# Patient Record
Sex: Male | Born: 1960 | Race: Black or African American | Hispanic: No | Marital: Married | Smoking: Never smoker
Health system: Southern US, Community
[De-identification: ages and names within clinical notes are randomized; demographics above are authoritative.]

## PROBLEM LIST (undated history)

## (undated) DIAGNOSIS — I10 Essential (primary) hypertension: Secondary | ICD-10-CM

## (undated) DIAGNOSIS — Z87442 Personal history of urinary calculi: Secondary | ICD-10-CM

## (undated) DIAGNOSIS — C801 Malignant (primary) neoplasm, unspecified: Secondary | ICD-10-CM

## (undated) DIAGNOSIS — K219 Gastro-esophageal reflux disease without esophagitis: Secondary | ICD-10-CM

## (undated) DIAGNOSIS — R739 Hyperglycemia, unspecified: Secondary | ICD-10-CM

## (undated) DIAGNOSIS — R972 Elevated prostate specific antigen [PSA]: Secondary | ICD-10-CM

## (undated) DIAGNOSIS — E669 Obesity, unspecified: Secondary | ICD-10-CM

## (undated) DIAGNOSIS — M25559 Pain in unspecified hip: Secondary | ICD-10-CM

## (undated) HISTORY — PX: COLONOSCOPY WITH PROPOFOL: SHX5780

## (undated) HISTORY — DX: Gastro-esophageal reflux disease without esophagitis: K21.9

## (undated) HISTORY — DX: Essential (primary) hypertension: I10

## (undated) MED FILL — Dexamethasone Sodium Phosphate Inj 100 MG/10ML: INTRAMUSCULAR | Qty: 1 | Status: AC

---

## 2012-02-21 ENCOUNTER — Ambulatory Visit: Payer: Self-pay | Admitting: Unknown Physician Specialty

## 2016-12-05 DIAGNOSIS — R972 Elevated prostate specific antigen [PSA]: Secondary | ICD-10-CM | POA: Insufficient documentation

## 2017-12-22 ENCOUNTER — Ambulatory Visit
Admission: EM | Admit: 2017-12-22 | Discharge: 2017-12-22 | Disposition: A | Discharge status: Home / Self Care | Payer: BLUE CROSS/BLUE SHIELD | Attending: Family Medicine | Admitting: Family Medicine

## 2017-12-22 ENCOUNTER — Encounter: Payer: Self-pay | Admitting: Gynecology

## 2017-12-22 ENCOUNTER — Other Ambulatory Visit: Payer: Self-pay

## 2017-12-22 DIAGNOSIS — R109 Unspecified abdominal pain: Secondary | ICD-10-CM

## 2017-12-22 DIAGNOSIS — E876 Hypokalemia: Secondary | ICD-10-CM | POA: Diagnosis not present

## 2017-12-22 DIAGNOSIS — R112 Nausea with vomiting, unspecified: Secondary | ICD-10-CM

## 2017-12-22 DIAGNOSIS — R3129 Other microscopic hematuria: Secondary | ICD-10-CM | POA: Diagnosis not present

## 2017-12-22 DIAGNOSIS — N289 Disorder of kidney and ureter, unspecified: Secondary | ICD-10-CM | POA: Diagnosis not present

## 2017-12-22 HISTORY — DX: Elevated prostate specific antigen (PSA): R97.20

## 2017-12-22 HISTORY — DX: Hyperglycemia, unspecified: R73.9

## 2017-12-22 HISTORY — DX: Pain in unspecified hip: M25.559

## 2017-12-22 HISTORY — DX: Obesity, unspecified: E66.9

## 2017-12-22 LAB — BASIC METABOLIC PANEL
ANION GAP: 13 (ref 5–15)
BUN: 22 mg/dL — ABNORMAL HIGH (ref 6–20)
CALCIUM: 9.5 mg/dL (ref 8.9–10.3)
CO2: 27 mmol/L (ref 22–32)
CREATININE: 1.66 mg/dL — AB (ref 0.61–1.24)
Chloride: 102 mmol/L (ref 98–111)
GFR calc Af Amer: 51 mL/min — ABNORMAL LOW (ref >60)
GFR calc non Af Amer: 44 mL/min — ABNORMAL LOW (ref >60)
Glucose, Bld: 135 mg/dL — ABNORMAL HIGH (ref 70–99)
Potassium: 3.1 mmol/L — ABNORMAL LOW (ref 3.5–5.1)
Sodium: 142 mmol/L (ref 135–145)

## 2017-12-22 LAB — URINALYSIS, COMPLETE (UACMP) WITH MICROSCOPIC
Bacteria, UA: NONE SEEN
Bilirubin Urine: NEGATIVE
GLUCOSE, UA: NEGATIVE mg/dL
Ketones, ur: NEGATIVE mg/dL
Leukocytes, UA: NEGATIVE
NITRITE: NEGATIVE
Protein, ur: NEGATIVE mg/dL
RBC / HPF: >50 RBC/hpf (ref 0–5)
SPECIFIC GRAVITY, URINE: 1.02 (ref 1.005–1.030)
Squamous Epithelial / LPF: NONE SEEN (ref 0–5)
pH: 6 (ref 5.0–8.0)

## 2017-12-22 MED ORDER — ONDANSETRON 8 MG PO TBDP
8.0000 mg | ORAL_TABLET | Freq: Once | ORAL | Status: AC
  Administered 2017-12-22: 8 mg via ORAL

## 2017-12-22 MED ORDER — ONDANSETRON 8 MG PO TBDP: 8.0000 mg | ORAL_TABLET | Freq: Three times a day (TID) | ORAL | 0 refills | Status: DC | PRN

## 2017-12-22 MED ORDER — POTASSIUM CHLORIDE CRYS ER 10 MEQ PO TBCR
10.0000 meq | EXTENDED_RELEASE_TABLET | Freq: Once | ORAL | Status: AC
  Administered 2017-12-22: 10 meq via ORAL

## 2017-12-22 MED ORDER — TAMSULOSIN HCL 0.4 MG PO CAPS: 0.4000 mg | ORAL_CAPSULE | Freq: Every day | ORAL | 0 refills | Status: DC

## 2017-12-22 NOTE — Discharge Instructions (Signed)
Increase water intake Hold diuretic tomorrow Follow up with Primary Care tomorrow If symptoms worsen tonight recommend going to Emergency Department

## 2017-12-22 NOTE — ED Triage Notes (Signed)
Patient c/o lower back pain / nausea and vomiting. Per patient after eating breakfast this morning starting to feel ill.

## 2017-12-22 NOTE — ED Provider Notes (Signed)
[facility]    CSN: [id number] Arrival date & time: 12/22/17  1357     History   Chief Complaint Chief Complaint  Patient presents with  . Back Pain    HPI Philip Thibault. is a 57 y.o. male.   57 yo male with a c/o right flank pain, nausea and vomiting since this morning. States he ate a 2 different cookouts yesterday. Denies any fevers, chills, diarrhea, dysuria, hematuria, chest pains, shortness of breath. States has vomited 3 times.  The history is provided by the patient.  Back Pain    Past Medical History:  Diagnosis Date  . Elevated PSA   . Hip pain   . Hyperglycemia   . Obesity     There are no active problems to display for this patient.   History reviewed. No pertinent surgical history.     Home Medications    Prior to Admission medications   Medication Sig Start Date End Date Taking? Authorizing Provider  amLODipine (NORVASC) 10 MG tablet take 1 tablet by mouth once daily 09/03/17  Yes [provider]  aspirin EC 81 MG tablet Take by mouth. 06/05/17  Yes [provider]  Cholecalciferol (VITAMIN D3) 2000 units capsule Take by mouth.   Yes [provider]  metoprolol succinate (TOPROL-XL) 100 MG 24 hr tablet take 1 tablet by mouth once daily 09/03/17  Yes [provider]  simvastatin (ZOCOR) 20 MG tablet take 1 tablet by mouth at bedtime 09/03/17  Yes [provider]  triamterene-hydrochlorothiazide (DYAZIDE) 37.5-25 MG capsule take 1 capsule by mouth once daily 09/03/17  Yes [provider]  ondansetron (ZOFRAN ODT) 8 MG disintegrating tablet Take 1 tablet (8 mg total) by mouth every 8 (eight) hours as needed. 12/22/17   Norval Gable, MD  tamsulosin (FLOMAX) 0.4 MG CAPS capsule Take 1 capsule (0.4 mg total) by mouth daily. 12/22/17   Norval Gable, MD    Family History Family History  Problem Relation Age of Onset  . Bone cancer Mother   . Hypertension Father   . Stroke Father      Social History Social History   Tobacco Use  . Smoking status: Never Smoker  . Smokeless tobacco: Never Used  Substance Use Topics  . Alcohol use: Yes    Comment: occasion  . Drug use: Never     Allergies   Patient has no known allergies.   Review of Systems Review of Systems  Musculoskeletal: Positive for back pain.     Physical Exam Triage Vital Signs ED Triage Vitals  Enc Vitals Group     BP 12/22/17 1411 140/82     Pulse Rate 12/22/17 1411 60     Resp 12/22/17 1411 16     Temp 12/22/17 1411 98.7 F (37.1 C)     Temp Source 12/22/17 1411 Oral     SpO2 12/22/17 1411 97 %     Weight 12/22/17 1410 260 lb (117.9 kg)     Height 12/22/17 1410 6\' 1"  (1.854 m)     Head Circumference --      Peak Flow --      Pain Score 12/22/17 1409 5     Pain Loc --      Pain Edu? --      Excl. in [place]? --    No data found.  Updated Vital Signs BP 140/82 (BP Location: Left Arm)   Pulse 60   Temp 98.7 F (37.1 C) (Oral)  Resp 16   Ht 6\' 1"  (1.854 m)   Wt 260 lb (117.9 kg)   SpO2 97%   BMI 34.30 kg/m   Visual Acuity Right Eye Distance:   Left Eye Distance:   Bilateral Distance:    Right Eye Near:   Left Eye Near:    Bilateral Near:     Physical Exam  Constitutional: He is oriented to person, place, and time. He appears well-developed and well-nourished. No distress.  HENT:  Head: Normocephalic and atraumatic.  Cardiovascular: Normal rate, regular rhythm, normal heart sounds and intact distal pulses.  No murmur heard. Pulmonary/Chest: Effort normal and breath sounds normal. No respiratory distress. He has no wheezes. He has no rales.  Abdominal: Soft. Bowel sounds are normal. He exhibits no distension and no mass. There is tenderness (mild, right flank tendnerness). There is no rebound and no guarding.  Neurological: He is alert and oriented to person, place, and time.  Skin: No rash noted. He is not diaphoretic.  Nursing note and vitals reviewed.    UC  Treatments / Results  Labs (all labs ordered are listed, but only abnormal results are displayed) Labs Reviewed  BASIC METABOLIC PANEL - Abnormal; Notable for the following components:      Result Value   Potassium 3.1 (*)    Glucose, Bld 135 (*)    BUN 22 (*)    Creatinine, Ser 1.66 (*)    GFR calc non Af Amer 44 (*)    GFR calc Af Amer 51 (*)    All other components within normal limits  URINALYSIS, COMPLETE (UACMP) WITH MICROSCOPIC - Abnormal; Notable for the following components:   Hgb urine dipstick MODERATE (*)    All other components within normal limits    EKG None  Radiology No results found.  Procedures Procedures (including critical care time)  Medications Ordered in UC Medications  ondansetron (ZOFRAN-ODT) disintegrating tablet 8 mg (8 mg Oral Given 12/22/17 1450)  potassium chloride (K-DUR,KLOR-CON) CR tablet 10 mEq (10 mEq Oral Given 12/22/17 1552)    Initial Impression / Assessment and Plan / UC Course  I have reviewed the triage vital signs and the nursing notes.  Pertinent labs & imaging results that were available during my care of the patient were reviewed by me and considered in my medical decision making (see chart for details).      Final Clinical Impressions(s) / UC Diagnoses   Final diagnoses:  Nausea and vomiting, intractability of vomiting not specified, unspecified vomiting type  Right flank pain  Other microscopic hematuria  Hypokalemia  Acute renal insufficiency     Discharge Instructions     Increase water intake Hold diuretic tomorrow Follow up with Primary Care tomorrow If symptoms worsen tonight recommend going to Emergency Department    ED Prescriptions    Medication Sig Dispense Auth. Provider   ondansetron (ZOFRAN ODT) 8 MG disintegrating tablet Take 1 tablet (8 mg total) by mouth every 8 (eight) hours as needed. 6 tablet Norval Gable, MD   tamsulosin (FLOMAX) 0.4 MG CAPS capsule Take 1 capsule (0.4 mg total) by mouth  daily. 10 capsule Norval Gable, MD      1. Lab results and diagnosis reviewed with patient 2.  Patient given zofran 8mg  odt x1 and kcl 10 MEQ po x 1 with improvement of symptoms; tolerating po fluids prior to D/C 3.  rx as per orders above; reviewed possible side effects, interactions, risks and benefits  3. Recommend supportive treatment as above  4. Follow up with PCP tomorrow for recheck labs or go to ED tonight if symptoms worsen   4. Follow-up prn   Controlled Substance Prescriptions [place] Controlled Substance Registry consulted? Not Applicable   Norval Gable, MD 12/22/17 [phone]

## 2018-04-28 NOTE — Progress Notes (Deleted)
April 29, 2018  1:12 PM   Philip Arnold [birth year] [id number]  Referring provider: Leonel Ramsay, MD No address on file  Chief Complaint  Patient presents with   Elevated PSA    HPI: Philip Arnold. is a 57 yo M was referred today for management and evaluation of elevated PSA. The patient was referred by [name], MD on 04/10/2018.  The pt notes that he was first made aware of his elevated PSA 2 years ago on routine PSA screening for insurance.  He has been followed by his PCP since.  Denies bone pain and unexpected weight loss. Denies FHx of prostate cancer. Pt reports a family history of bone marrow cancer, throat, and lung cancer on mother's side, and denies breast cancer.   Pt was put on Flowmax by Dr. [name] due to describing urination as a weak stream. Pt reports "urgency every so often" and it may be due to paying more attention to urinating.   PSA tend: 04/03/2018 PSA was 6.24 06/05/2017 PSA was 4.26 12/05/2016 PSA was 5.04   PMH: Past Medical History:  Diagnosis Date   Elevated PSA    GERD (gastroesophageal reflux disease)    Hip pain    Hyperglycemia    Hypertension    Obesity     Surgical History: No past surgical history on file.  Home Medications:  Allergies as of 04/29/2018   No Known Allergies     Medication List        Accurate as of 04/29/18  1:12 PM. Always use your most recent med list.          amLODipine 10 MG tablet Commonly known as:  NORVASC take 1 tablet by mouth once daily   aspirin EC 81 MG tablet Take by mouth.   metoprolol succinate 100 MG 24 hr tablet Commonly known as:  TOPROL-XL take 1 tablet by mouth once daily   potassium chloride 10 MEQ tablet Commonly known as:  K-DUR Take 10 mEq by mouth daily.   PRILOSEC OTC 20 MG tablet Generic drug:  omeprazole Take by mouth.   simvastatin 20 MG tablet Commonly known as:  ZOCOR take 1 tablet by mouth at bedtime     tamsulosin 0.4 MG Caps capsule Commonly known as:  FLOMAX Take 1 capsule (0.4 mg total) by mouth daily.   triamterene-hydrochlorothiazide 37.5-25 MG capsule Commonly known as:  DYAZIDE take 1 capsule by mouth once daily   Vitamin D3 50 MCG (2000 UT) capsule Take by mouth.       Allergies: No Known Allergies  Family History: Family History  Problem Relation Age of Onset   Bone cancer Mother    Hypertension Father    Stroke Father     Social History:  reports that he has never smoked. He has never used smokeless tobacco. He reports that he drinks alcohol. He reports that he does not use drugs.  ROS: UROLOGY Frequent Urination?: No Hard to postpone urination?: No Burning/pain with urination?: No Get up at night to urinate?: No Leakage of urine?: Yes Urine stream starts and stops?: No Trouble starting stream?: No Do you have to strain to urinate?: No Blood in urine?: No Urinary tract infection?: No Sexually transmitted disease?: No Injury to kidneys or bladder?: No Painful intercourse?: No Weak stream?: No Erection problems?: No Penile pain?: No  Gastrointestinal Nausea?: No Vomiting?: No Indigestion/heartburn?: No Diarrhea?: No Constipation?: No  Constitutional Fever: No Night sweats?: No Weight loss?: No  Fatigue?: No  Skin Skin rash/lesions?: No Itching?: No  Eyes Blurred vision?: No Double vision?: No  Ears/Nose/Throat Sore throat?: No Sinus problems?: No  Hematologic/Lymphatic Swollen glands?: No Easy bruising?: No  Cardiovascular Leg swelling?: No Chest pain?: No  Respiratory Cough?: No Shortness of breath?: No  Endocrine Excessive thirst?: No  Musculoskeletal Back pain?: No Joint pain?: No  Neurological Headaches?: No Dizziness?: No  Psychologic Depression?: No Anxiety?: No  Physical Exam: BP (!) 144/79 (BP Location: Left Arm, Patient Position: Sitting, Cuff Size: Large)    Pulse (!) 59    Ht 6' (1.829 m)    Wt  247 lb (112 kg)    BMI 33.50 kg/m   Constitutional:  Alert and oriented, No acute distress. HEENT: [place] AT, moist mucus membranes.  Trachea midline, no masses. Cardiovascular: No clubbing, cyanosis, or edema. Respiratory: Normal respiratory effort, no increased work of breathing. GU: No CVA tenderness, rectal exam with no nodules,normal  sphincter tone, 30 g prostate. Skin: No rashes, bruises or suspicious lesions. Neurologic: Grossly intact, no focal deficits, moving all 4 extremities. Psychiatric: Normal mood and affect.  Laboratory Data: Lab Results  Component Value Date   CREATININE 1.66 (H) 12/22/2017   Urinalysis    Component Value Date/Time   COLORURINE YELLOW 12/22/2017 [place] 12/22/2017 1449   LABSPEC 1.020 12/22/2017 1449   PHURINE 6.0 12/22/2017 1449   GLUCOSEU NEGATIVE 12/22/2017 1449   HGBUR MODERATE (A) 12/22/2017 1449   BILIRUBINUR NEGATIVE 12/22/2017 1449   KETONESUR NEGATIVE 12/22/2017 1449   PROTEINUR NEGATIVE 12/22/2017 1449   NITRITE NEGATIVE 12/22/2017 1449   LEUKOCYTESUR NEGATIVE 12/22/2017 1449    Lab Results  Component Value Date   BACTERIA NONE SEEN 12/22/2017    Assessment & Plan:    1. Elevated PSA  -Advised to stop Flomax to evaluate for change in symptoms (relatively asymptomatic) -Discussed procedure and complications of prostate biopsy -We reviewed the implications of an elevated PSA and the uncertainty surrounding it. In general, a man's PSA increases with age and is produced by both normal and cancerous prostate tissue. Differential for elevated PSA is BPH, prostate cancer, infection, recent intercourse/ejaculation, prostate infarction, recent urethroscopic manipulation (foley placement/cystoscopy) and prostatitis. Management of an elevated PSA can include observation or prostate biopsy and wediscussed this in detail. -We discussed that indications for prostate biopsy are defined by age and race specific PSA cutoffs as well  as a PSA velocity of 0.75/year. -Follow-up in 3 months to trend PSA   Return in about 3 months (around 07/30/2018) for Clinic Visit with Labs 3 Days prior .  Hollice Espy, MD  [facility] [address], [place],  [address] [phone]  I, [name], am acting as a scribe for Dr. Hollice Espy,  I have reviewed the above documentation for accuracy and completeness, and I agree with the above.   Hollice Espy, MD

## 2018-04-29 ENCOUNTER — Encounter: Payer: Self-pay | Admitting: Urology

## 2018-04-29 ENCOUNTER — Ambulatory Visit (INDEPENDENT_AMBULATORY_CARE_PROVIDER_SITE_OTHER): Payer: BLUE CROSS/BLUE SHIELD | Admitting: Urology

## 2018-04-29 VITALS — BP 144/79 | HR 59 | Ht 72.0 in | Wt 247.0 lb

## 2018-04-29 DIAGNOSIS — I10 Essential (primary) hypertension: Secondary | ICD-10-CM | POA: Insufficient documentation

## 2018-04-29 DIAGNOSIS — R972 Elevated prostate specific antigen [PSA]: Secondary | ICD-10-CM

## 2018-04-29 DIAGNOSIS — M25559 Pain in unspecified hip: Secondary | ICD-10-CM | POA: Insufficient documentation

## 2018-04-29 DIAGNOSIS — E7800 Pure hypercholesterolemia, unspecified: Secondary | ICD-10-CM | POA: Insufficient documentation

## 2018-04-29 DIAGNOSIS — E669 Obesity, unspecified: Secondary | ICD-10-CM | POA: Insufficient documentation

## 2018-04-29 DIAGNOSIS — R739 Hyperglycemia, unspecified: Secondary | ICD-10-CM | POA: Insufficient documentation

## 2018-04-29 NOTE — Progress Notes (Signed)
April 29, 2018  1:12 PM   Philip Arnold [birth year] [id number]  Referring provider: Leonel Ramsay, MD No address on file  Chief Complaint  Patient presents with  . Elevated PSA    HPI: Philip Arnold. is a 57 yo M was referred today for management and evaluation of elevated PSA. The patient was referred by [name], MD on 04/10/2018.  The pt notes that he was first made aware of his elevated PSA 2 years ago on routine PSA screening for insurance.  He has been followed by his PCP since.  Denies bone pain and unexpected weight loss. Denies FHx of prostate cancer. Pt reports a family history of bone marrow cancer, throat, and lung cancer on mother's side, and denies breast cancer.   Pt was put on Flowmax by Dr. [name] due to describing urination as a weak stream. Pt reports "urgency every so often" and it may be due to paying more attention to urinating.   PSA tend: 04/03/2018 PSA was 6.24 06/05/2017 PSA was 4.26 12/05/2016 PSA was 5.04   PMH: Past Medical History:  Diagnosis Date  . Elevated PSA   . GERD (gastroesophageal reflux disease)   . Hip pain   . Hyperglycemia   . Hypertension   . Obesity     Surgical History: No past surgical history on file.  Home Medications:  Allergies as of 04/29/2018   No Known Allergies     Medication List        Accurate as of 04/29/18  1:12 PM. Always use your most recent med list.          amLODipine 10 MG tablet Commonly known as:  NORVASC take 1 tablet by mouth once daily   aspirin EC 81 MG tablet Take by mouth.   metoprolol succinate 100 MG 24 hr tablet Commonly known as:  TOPROL-XL take 1 tablet by mouth once daily   potassium chloride 10 MEQ tablet Commonly known as:  K-DUR Take 10 mEq by mouth daily.   PRILOSEC OTC 20 MG tablet Generic drug:  omeprazole Take by mouth.   simvastatin 20 MG tablet Commonly known as:  ZOCOR take 1 tablet by mouth at bedtime     tamsulosin 0.4 MG Caps capsule Commonly known as:  FLOMAX Take 1 capsule (0.4 mg total) by mouth daily.   triamterene-hydrochlorothiazide 37.5-25 MG capsule Commonly known as:  DYAZIDE take 1 capsule by mouth once daily   Vitamin D3 50 MCG (2000 UT) capsule Take by mouth.       Allergies: No Known Allergies  Family History: Family History  Problem Relation Age of Onset  . Bone cancer Mother   . Hypertension Father   . Stroke Father     Social History:  reports that he has never smoked. He has never used smokeless tobacco. He reports that he drinks alcohol. He reports that he does not use drugs.  ROS: UROLOGY Frequent Urination?: No Hard to postpone urination?: No Burning/pain with urination?: No Get up at night to urinate?: No Leakage of urine?: Yes Urine stream starts and stops?: No Trouble starting stream?: No Do you have to strain to urinate?: No Blood in urine?: No Urinary tract infection?: No Sexually transmitted disease?: No Injury to kidneys or bladder?: No Painful intercourse?: No Weak stream?: No Erection problems?: No Penile pain?: No  Gastrointestinal Nausea?: No Vomiting?: No Indigestion/heartburn?: No Diarrhea?: No Constipation?: No  Constitutional Fever: No Night sweats?: No Weight loss?: No  Fatigue?: No  Skin Skin rash/lesions?: No Itching?: No  Eyes Blurred vision?: No Double vision?: No  Ears/Nose/Throat Sore throat?: No Sinus problems?: No  Hematologic/Lymphatic Swollen glands?: No Easy bruising?: No  Cardiovascular Leg swelling?: No Chest pain?: No  Respiratory Cough?: No Shortness of breath?: No  Endocrine Excessive thirst?: No  Musculoskeletal Back pain?: No Joint pain?: No  Neurological Headaches?: No Dizziness?: No  Psychologic Depression?: No Anxiety?: No  Physical Exam: BP (!) 144/79 (BP Location: Left Arm, Patient Position: Sitting, Cuff Size: Large)   Pulse (!) 59   Ht 6' (1.829 m)   Wt  247 lb (112 kg)   BMI 33.50 kg/m   Constitutional:  Alert and oriented, No acute distress. HEENT: [place] AT, moist mucus membranes.  Trachea midline, no masses. Cardiovascular: No clubbing, cyanosis, or edema. Respiratory: Normal respiratory effort, no increased work of breathing. GU: No CVA tenderness, rectal exam with no nodules,normal  sphincter tone, 30 g prostate. Skin: No rashes, bruises or suspicious lesions. Neurologic: Grossly intact, no focal deficits, moving all 4 extremities. Psychiatric: Normal mood and affect.  Laboratory Data: Lab Results  Component Value Date   CREATININE 1.66 (H) 12/22/2017   Urinalysis    Component Value Date/Time   COLORURINE YELLOW 12/22/2017 [place] 12/22/2017 1449   LABSPEC 1.020 12/22/2017 1449   PHURINE 6.0 12/22/2017 1449   GLUCOSEU NEGATIVE 12/22/2017 1449   HGBUR MODERATE (A) 12/22/2017 1449   BILIRUBINUR NEGATIVE 12/22/2017 1449   KETONESUR NEGATIVE 12/22/2017 1449   PROTEINUR NEGATIVE 12/22/2017 1449   NITRITE NEGATIVE 12/22/2017 1449   LEUKOCYTESUR NEGATIVE 12/22/2017 1449    Lab Results  Component Value Date   BACTERIA NONE SEEN 12/22/2017    Assessment & Plan:    1. Elevated PSA  -Advised to stop Flomax to evaluate for change in symptoms (relatively asymptomatic) -Discussed procedure and complications of prostate biopsy -We reviewed the implications of an elevated PSA and the uncertainty surrounding it. In general, a man's PSA increases with age and is produced by both normal and cancerous prostate tissue. Differential for elevated PSA is BPH, prostate cancer, infection, recent intercourse/ejaculation, prostate infarction, recent urethroscopic manipulation (foley placement/cystoscopy) and prostatitis. Management of an elevated PSA can include observation or prostate biopsy and wediscussed this in detail. -We discussed that indications for prostate biopsy are defined by age and race specific PSA cutoffs as well  as a PSA velocity of 0.75/year. -Follow-up in 3 months to trend PSA   Return in about 3 months (around 07/30/2018) for Clinic Visit with Labs 3 Days prior .  Hollice Espy, MD  [facility] [address] [place], [place] [address] [phone]  I, [name], am acting as a scribe for Dr. Hollice Espy,  I have reviewed the above documentation for accuracy and completeness, and I agree with the above.   Hollice Espy, MD

## 2018-07-21 ENCOUNTER — Other Ambulatory Visit: Payer: Self-pay

## 2018-07-21 DIAGNOSIS — R972 Elevated prostate specific antigen [PSA]: Secondary | ICD-10-CM

## 2018-07-22 ENCOUNTER — Other Ambulatory Visit: Payer: BC Managed Care – PPO

## 2018-07-22 DIAGNOSIS — R972 Elevated prostate specific antigen [PSA]: Secondary | ICD-10-CM

## 2018-07-23 ENCOUNTER — Other Ambulatory Visit: Payer: BLUE CROSS/BLUE SHIELD

## 2018-07-23 LAB — PSA: Prostate Specific Ag, Serum: 7 ng/mL — ABNORMAL HIGH (ref 0.0–4.0)

## 2018-07-26 NOTE — Progress Notes (Signed)
07/29/2018  1:58 PM   Philip Arnold. 06/28/1960 [id number]  Referring provider: [name], MD [place], [place] [address]  Chief Complaint  Patient presents with  . Elevated PSA    HPI: Philip Arnold. is a 58 y.o. male who presents today for his three-month surveillance of his elevated PSA.  PSA trend is below.    Notably his rectal exam last visit was 30 grams, no nodules.  He is on Flomax, with minimal urinary symptoms.  Denies hematuria, dysuria and suprapubic pain.   Patient denies any knowledge of a family history of prostate cancer.  His father did see a Dealer in his 81s for unknown reason but doesn't think it was for cancer.  Patient is currently on baby aspirin for prevention.  PSA Trend:  12/05/2016, 5.04  06/05/2018, 4.26  05/04/2018, 6.24  07/22/2018, 7.0  PMH: Past Medical History:  Diagnosis Date  . Elevated PSA   . GERD (gastroesophageal reflux disease)   . Hip pain   . Hyperglycemia   . Hypertension   . Obesity     Surgical History: History reviewed. No pertinent surgical history.  Home Medications:  Allergies as of 07/29/2018   No Known Allergies     Medication List       Accurate as of July 29, 2018  1:58 PM. Always use your most recent med list.        amLODipine 10 MG tablet Commonly known as:  NORVASC take 1 tablet by mouth once daily   aspirin EC 81 MG tablet Take by mouth.   metoprolol succinate 100 MG 24 hr tablet Commonly known as:  TOPROL-XL take 1 tablet by mouth once daily   potassium chloride 10 MEQ tablet Commonly known as:  K-DUR Take 10 mEq by mouth every other day.   PRILOSEC OTC 20 MG tablet Generic drug:  omeprazole Take by mouth.   simvastatin 20 MG tablet Commonly known as:  ZOCOR take 1 tablet by mouth at bedtime   triamterene-hydrochlorothiazide 37.5-25 MG capsule Commonly known as:  DYAZIDE take 1 capsule by mouth once daily   Vitamin D3 50 MCG (2000  UT) capsule Take by mouth.       Allergies: No Known Allergies  Family History: Family History  Problem Relation Age of Onset  . Bone cancer Mother   . Hypertension Father   . Stroke Father     Social History:  reports that he has never smoked. He has never used smokeless tobacco. He reports current alcohol use. He reports that he does not use drugs.  ROS: UROLOGY Frequent Urination?: Yes Hard to postpone urination?: No Burning/pain with urination?: No Get up at night to urinate?: No Leakage of urine?: Yes Urine stream starts and stops?: No Trouble starting stream?: No Do you have to strain to urinate?: No Blood in urine?: No Urinary tract infection?: No Sexually transmitted disease?: No Injury to kidneys or bladder?: No Painful intercourse?: No Weak stream?: No Erection problems?: No Penile pain?: No  Gastrointestinal Nausea?: No Vomiting?: No Indigestion/heartburn?: No Diarrhea?: No Constipation?: No  Constitutional Fever: No Night sweats?: No Weight loss?: No Fatigue?: No  Skin Skin rash/lesions?: No Itching?: No  Eyes Blurred vision?: No Double vision?: No  Ears/Nose/Throat Sore throat?: No Sinus problems?: No  Hematologic/Lymphatic Swollen glands?: No Easy bruising?: No  Cardiovascular Leg swelling?: No Chest pain?: No  Respiratory Cough?: No Shortness of breath?: No  Endocrine Excessive thirst?: No  Musculoskeletal  Back pain?: No Joint pain?: No  Neurological Headaches?: No Dizziness?: No  Psychologic Depression?: No Anxiety?: No  Physical Exam: BP (!) 153/87 (BP Location: Left Arm, Patient Position: Sitting, Cuff Size: Large)   Pulse (!) 56   Ht 6' (1.829 m)   Wt 245 lb 3.2 oz (111.2 kg)   BMI 33.26 kg/m   Constitutional:  Well nourished. Alert and oriented, No acute distress. Cardiovascular: No clubbing, cyanosis, or edema. Respiratory: Normal respiratory effort, no increased work of breathing. Skin: No rashes,  bruises or suspicious lesions. Multiple freckles on arms.   Neurologic: Grossly intact, no focal deficits, moving all 4 extremities. Psychiatric: Normal mood and affect.  Laboratory Data: Lab Results  Component Value Date   CREATININE 1.66 (H) 12/22/2017    Assessment & Plan:    1. Elevated PSA - Patient's PSA is rising, now over 7, the overall trend is worrisome - Counseled patient that his PSA has continued to climb above what it ought to be for his age and he should consider a prostate biopsy at this point. - We discussed prostate biopsy in detail including the procedure itself, the risks of blood in the urine, stool, and ejaculate, serious infection, and discomfort. He is willing to proceed with this as discussed. - Patient will need to withhold Asprin for 7 days prior to surgery. - Discussed with patient the steps following the biopsy  2. BPH - Continue Flomax; symptoms stable  Return for Prostate biopsy, next available.    [facility] [address] [place], [place] [address] [phone]  I, Adele Schilder, am acting as a scribe for Hollice Espy, MD.    I have reviewed the above documentation for accuracy and completeness, and I agree with the above.   Hollice Espy, MD

## 2018-07-29 ENCOUNTER — Ambulatory Visit: Payer: BC Managed Care – PPO | Admitting: Urology

## 2018-07-29 ENCOUNTER — Encounter: Payer: Self-pay | Admitting: Urology

## 2018-07-29 VITALS — BP 153/87 | HR 56 | Ht 72.0 in | Wt 245.2 lb

## 2018-07-29 DIAGNOSIS — N400 Enlarged prostate without lower urinary tract symptoms: Secondary | ICD-10-CM

## 2018-07-29 DIAGNOSIS — R972 Elevated prostate specific antigen [PSA]: Secondary | ICD-10-CM

## 2018-07-29 NOTE — Patient Instructions (Signed)
Transrectal Ultrasound Transrectal ultrasound is a procedure that uses sound waves to create images of your prostate gland and nearby tissues. The sound waves are sent through the wall of your rectum into your prostate gland, which is located in front of your rectum. The images show the size and shape of your prostate gland and nearby structures. You may have this test if you have:  Trouble urinating.  Infertility.  An abnormal prostate screening exam. Tell a health care provider about:  Any allergies you have.  All medicines you are taking, including vitamins, herbs, eye drops, creams, and over-the-counter medicines.  Any blood disorders you have.  Any medical conditions you have.  Any surgeries you have had. What are the risks? Generally, this is a safe procedure. However, problems may occur, including:  Discomfort during the procedure. This is rare.  Blood in your urine or sperm after the procedure. This is rare. What happens before the procedure?  Your health care provider may instruct you to use an enema 1-4 hours before the procedure. Follow instructions from your health care provider about how to do the enema.  Ask your health care provider about changing or stopping your regular medicines. This is especially important if you are taking diabetes medicines or blood thinners. What happens during the procedure?  You will be asked to lie down on your left side on an examination table.  You will bend your knees toward your chest.  A lubricated probe will be gently inserted into your rectum. This may cause a feeling of fullness.  The probe will send signals to a computer that will create images.  The technician will slightly rotate the probe throughout the procedure. While rotating the probe, he or she will view and capture images of the prostate gland and the surrounding structures from different angles.  The probe will be removed. The procedure may vary among health care  providers and hospitals. What happens after the procedure?  It is up to you to get the results of your procedure. Ask your health care provider, or the department that is doing the procedure, when your results will be ready. Summary  Transrectal ultrasound is a procedure that uses sound waves to create images of your prostate gland and nearby tissues.  The images show the size and shape of your prostate gland and nearby structures.  Before the procedure, ask your health care provider about changing or stopping your regular medicines. This is especially important if you are taking diabetes medicines or blood thinners. This information is not intended to replace advice given to you by your health care provider. Make sure you discuss any questions you have with your health care provider. Document Released: 03/14/2005 Document Revised: 04/27/2016 Document Reviewed: 04/27/2016 Elsevier Interactive Patient Education  2019 [employer].

## 2018-08-19 ENCOUNTER — Ambulatory Visit (INDEPENDENT_AMBULATORY_CARE_PROVIDER_SITE_OTHER): Payer: BC Managed Care – PPO | Admitting: Urology

## 2018-08-19 ENCOUNTER — Other Ambulatory Visit: Payer: Self-pay | Admitting: Urology

## 2018-08-19 ENCOUNTER — Encounter: Payer: Self-pay | Admitting: Urology

## 2018-08-19 VITALS — BP 150/88 | HR 66 | Ht 72.0 in | Wt 245.0 lb

## 2018-08-19 DIAGNOSIS — R972 Elevated prostate specific antigen [PSA]: Secondary | ICD-10-CM | POA: Diagnosis not present

## 2018-08-19 DIAGNOSIS — N400 Enlarged prostate without lower urinary tract symptoms: Secondary | ICD-10-CM

## 2018-08-19 MED ORDER — GENTAMICIN SULFATE 40 MG/ML IJ SOLN
80.0000 mg | Freq: Once | INTRAMUSCULAR | Status: AC
  Administered 2018-08-19: 80 mg via INTRAMUSCULAR

## 2018-08-19 MED ORDER — LEVOFLOXACIN 500 MG PO TABS
500.0000 mg | ORAL_TABLET | Freq: Once | ORAL | Status: AC
  Administered 2018-08-19: 500 mg via ORAL

## 2018-08-19 NOTE — Progress Notes (Signed)
° °  08/19/2018   CC:  Chief Complaint  Patient presents with   Prostate Biopsy   HPI: Philip Arnold. is a 58 y.o. male who presents today for his prostate biopsy secondary to rising PSA. PSA trend is below.   Notably his rectal exam last visit was 30 grams, no nodules.  He is on Flomax, with minimal urinary symptoms.  Denies hematuria, dysuria and suprapubic pain.   Patient denies any knowledge of a family history of prostate cancer.  His father did see a Dealer in his 4s for unknown reason but doesn't think it was for cancer.  PSA Trend:             12/05/2016, 5.04             06/05/2018, 4.26             05/04/2018, 6.24              07/22/2018, 7.0  Today's Vitals   08/19/18 1057  BP: (!) 150/88  Pulse: 66  Weight: 245 lb (111.1 kg)  Height: 6' (1.829 m)   Body mass index is 33.23 kg/m. NED. A&Ox3.   No respiratory distress   Abd soft, NT, ND Normal sphincter tone  Prostate Biopsy Procedure   Informed consent was obtained after discussing risks/benefits of the procedure.  A time out was performed to ensure correct patient identity.  Pre-Procedure: - Last PSA Level: 7.0 - Gentamicin given prophylactically - Levaquin 500 mg administered PO -Transrectal Ultrasound performed revealing a 4.17 cm x 3.46 cm x 4.8 cm, Volume is 36. 2 mL prostate -No significant hypoechoic or median lobe noted  Procedure: - Prostate block performed using 10 cc 1% lidocaine and biopsies taken from sextant areas, a total of 12 under ultrasound guidance.  Post-Procedure: - Patient tolerated the procedure well - He was counseled to seek immediate medical attention if experiences any severe pain, significant bleeding, or fevers - Return in one week to discuss biopsy results  Assessment/ Plan:  1. Elevated PSA Patient's PSA is rising, now over 7, the overall trend is worrisome Pt tolerated prostate biopsy, discussed possible symptoms of blood in urine, normal stool and  ejaculated fluid. Fevers is the major complication and needs to go to ED indicating possible sepsis or something life threatening.  F/u as schedule for results  2. BPH Continue Flomax; symptoms stable  Hollice Espy, MD   I, [name], am acting as a scribe for Dr. Hollice Espy,  I have reviewed the above documentation for accuracy and completeness, and I agree with the above.   Hollice Espy, MD

## 2018-08-22 LAB — PROSTATE BIOPSIES

## 2018-08-25 ENCOUNTER — Other Ambulatory Visit: Payer: Self-pay | Admitting: Urology

## 2018-09-03 ENCOUNTER — Ambulatory Visit: Payer: BC Managed Care – PPO | Admitting: Urology

## 2018-09-03 ENCOUNTER — Other Ambulatory Visit: Payer: Self-pay

## 2018-09-03 ENCOUNTER — Encounter: Payer: Self-pay | Admitting: Urology

## 2018-09-03 VITALS — BP 158/93 | HR 54 | Ht 72.0 in | Wt 248.0 lb

## 2018-09-03 DIAGNOSIS — C61 Malignant neoplasm of prostate: Secondary | ICD-10-CM | POA: Diagnosis not present

## 2018-09-03 NOTE — Progress Notes (Signed)
09/03/2018 9:47 AM   Philip Arnold. 09-14-1960 [id number]  Referring provider: [name], [name], [name] [address]  Chief Complaint  Patient presents with   Prostate Cancer    Prostate Biopsy Results    HPI: 58 yo with newly dz prostate cancer who returns to the office today to discuss his biopsy results.  Prostate biopsy on 08/19/2018 which was uncomplicated.  Biopsy results revealed 1 single core of Gleason 3+4 involving 9% of the tissue at the right lateral apex.  There is also an atypical focus of suspicious appearing cells at the left apex but not diagnostic of prostate cancer.  Remainder of the prostate was benign.   TRUS volume 36.2 mL  PSA 7.0  No previous surgeries.   Minimal urinary symptoms.  No significant erectile dysfunction.  IPSS and Shim as below.  IPSS    Row Name 09/03/18 1100         International Prostate Symptom Score   How often have you had the sensation of not emptying your bladder?  Less than 1 in 5     How often have you had to urinate less than every two hours?  Less than 1 in 5 times     How often have you found you stopped and started again several times when you urinated?  Not at All     How often have you found it difficult to postpone urination?  Less than 1 in 5 times     How often have you had a weak urinary stream?  Less than 1 in 5 times     How often have you had to strain to start urination?  Not at All     How many times did you typically get up at night to urinate?  None     Total IPSS Score  4       Quality of Life due to urinary symptoms   If you were to spend the rest of your life with your urinary condition just the way it is now how would you feel about that?  Mixed        Score:  1-7 Mild 8-19 Moderate 20-35 Severe  SHIM    Row Name 09/03/18 1135         SHIM: Over the last 6 months:   How do you rate your confidence that you could get and keep an erection?  High     When  you had erections with sexual stimulation, how often were your erections hard enough for penetration (entering your partner)?  Almost Always or Always     During sexual intercourse, how often were you able to maintain your erection after you had penetrated (entered) your partner?  Almost Always or Always     During sexual intercourse, how difficult was it to maintain your erection to completion of intercourse?  Not Difficult     When you attempted sexual intercourse, how often was it satisfactory for you?  Most Times (much more than half the time)       SHIM Total Score   SHIM  23         PMH: Past Medical History:  Diagnosis Date   Elevated PSA    GERD (gastroesophageal reflux disease)    Hip pain    Hyperglycemia    Hypertension    Obesity     Surgical History: No past surgical history on file.  Home Medications:  Allergies  as of 09/03/2018   No Known Allergies     Medication List       Accurate as of September 03, 2018 11:59 PM. Always use your most recent med list.        amLODipine 10 MG tablet Commonly known as:  NORVASC take 1 tablet by mouth once daily   aspirin EC 81 MG tablet Take by mouth.   metoprolol succinate 100 MG 24 hr tablet Commonly known as:  TOPROL-XL take 1 tablet by mouth once daily   potassium chloride 10 MEQ tablet Commonly known as:  K-DUR Take 10 mEq by mouth every other day.   PriLOSEC OTC 20 MG tablet Generic drug:  omeprazole Take by mouth.   simvastatin 20 MG tablet Commonly known as:  ZOCOR take 1 tablet by mouth at bedtime   triamterene-hydrochlorothiazide 37.5-25 MG capsule Commonly known as:  DYAZIDE take 1 capsule by mouth once daily   Vitamin D3 50 MCG (2000 UT) capsule Take by mouth.       Allergies: No Known Allergies  Family History: Family History  Problem Relation Age of Onset   Bone cancer Mother    Hypertension Father    Stroke Father     Social History:  reports that he has never smoked.  He has never used smokeless tobacco. He reports current alcohol use. He reports that he does not use drugs.  ROS: UROLOGY Frequent Urination?: No Hard to postpone urination?: No Burning/pain with urination?: No Get up at night to urinate?: No Leakage of urine?: No Urine stream starts and stops?: No Trouble starting stream?: No Do you have to strain to urinate?: No Blood in urine?: No Urinary tract infection?: No Sexually transmitted disease?: No Injury to kidneys or bladder?: No Painful intercourse?: No Weak stream?: No Erection problems?: No Penile pain?: No  Gastrointestinal Nausea?: No Vomiting?: No Indigestion/heartburn?: No Diarrhea?: No Constipation?: No  Constitutional Fever: No Night sweats?: No Weight loss?: No Fatigue?: No  Skin Skin rash/lesions?: No Itching?: No  Eyes Blurred vision?: No Double vision?: No  Ears/Nose/Throat Sore throat?: No Sinus problems?: No  Hematologic/Lymphatic Swollen glands?: No Easy bruising?: No  Cardiovascular Leg swelling?: No Chest pain?: No  Respiratory Cough?: No Shortness of breath?: No  Endocrine Excessive thirst?: No  Musculoskeletal Back pain?: No Joint pain?: No  Neurological Headaches?: No Dizziness?: No  Psychologic Depression?: No Anxiety?: No  Physical Exam: BP (!) 158/93    Pulse (!) 54    Ht 6' (1.829 m)    Wt 248 lb (112.5 kg)    BMI 33.63 kg/m   Constitutional:  Alert and oriented, No acute distress.  He by wife today. HEENT: [place] AT, moist mucus membranes.  Trachea midline, no masses. Cardiovascular: No clubbing, cyanosis, or edema. Respiratory: Normal respiratory effort, no increased work of breathing. GI: Abdomen is soft, nontender, nondistended, no abdominal masses.  No abdominal / umbilical hernia. Skin: No rashes, bruises or suspicious lesions. Neurologic: Grossly intact, no focal deficits, moving all 4 extremities. Psychiatric: Normal mood and affect.  Laboratory  Data: PSA as above  Lab Results  Component Value Date   CREATININE 1.66 (H) 12/22/2017    Pertinent Imaging: n/a  Assessment & Plan:    1. Prostate cancer [facility]) 58 year old male with newly diagnosed intermediate risk prostate cancer, favorable (l ow volume single core Gleason 3+4)  The patient was counseled about the natural history of prostate cancer and the standard treatment options that are available for prostate cancer. It was  explained to him how his age and life expectancy, clinical stage, Gleason score, and PSA affect his prognosis, the decision to proceed with additional staging studies, as well as how that information influences recommended treatment strategies. We discussed the roles for active surveillance, radiation therapy, surgical therapy, androgen deprivation, as well as ablative therapy options for the treatment of prostate cancer as appropriate to his individual cancer situation. We discussed the risks and benefits of these options with regard to their impact on cancer control and also in terms of potential adverse events, complications, and impact on quality of life particularly related to urinary, bowel, and sexual function. The patient was encouraged to ask questions throughout the discussion today and all questions were answered to his stated satisfaction. In addition, the patient was providedwith and/or directed to appropriate resources and literature for further education about prostate cancer treatment options.  We discussed surgical therapy for prostate cancer including the different available surgical approaches. We discussed, in detail, the risks and expectations of surgery with regard to cancer control, urinary control, and erectile dysfunction as well as expected post operative cover he processed. Additional risks of surgery including but not limalited to bleeding, infection, hernia formation, nerve damage, steel formation, bowel/rect injury, potentially necessitating  colostomy, damage to the urinary tract resulting in urinary leakage, urethral stricture, and cardiopulmonary risk such as myocardial infarction, stroke, death, thromboembolism etc. were explained. The risk of open surgical conversion for robotics/laparoscopic prostatectomy is also discussed.  We discussed referral to radiation oncology which she declined at this time.  In addition to the above, we had a lengthy discussion today about consideration of active surveillance.  New guidelines indicate that very low volume Gleason 3 was 4 disease may be managed by very close surveillance.  We also discussed the option of further evaluation with tumor genetics to help further re-stratify his disease.  He is interested in pursuing this.  Will contact [facility] and have this processed.  We discussed that this may be helpful if his genetics come back low risk or very high risk, less helpful if it falls into the intermediate risk category.  I will call him when I receive his cancer genetics to discuss his desire for continuing active surveillance.  We will plan for MRI in 6 months with PSA/rectal exam for close surveillance.  He understands that given his age, there is a good chance he will eventually need intervention.  - MR PROSTATE W WO CONTRAST; Future - PSA; Future   Return in about 6 months (around 03/06/2019) for MRI/ PSA. will call with results of cancer genetics.  Hollice Espy, MD  [facility] [address] [place], [place] [address] [phone]  I spent 35 min with this patient of which greater than 50% was spent in counseling and coordination of care with the patient.

## 2018-09-24 ENCOUNTER — Telehealth: Payer: Self-pay

## 2018-09-24 NOTE — Telephone Encounter (Signed)
Spoke w/Alexis  At [facility] to add on [facility] Test #[id number]

## 2018-09-24 NOTE — Telephone Encounter (Signed)
Philip Arnold from [facility] called stating that the [facility] test gets sent out to another facility and due to [employer] they are temporarily closed and expected to reopen sometime in may Ref #[id number]

## 2019-02-27 ENCOUNTER — Other Ambulatory Visit: Payer: Self-pay

## 2019-02-27 ENCOUNTER — Other Ambulatory Visit: Payer: BC Managed Care – PPO

## 2019-02-27 DIAGNOSIS — C61 Malignant neoplasm of prostate: Secondary | ICD-10-CM

## 2019-02-28 LAB — PSA: Prostate Specific Ag, Serum: 7.9 ng/mL — ABNORMAL HIGH (ref 0.0–4.0)

## 2019-03-06 ENCOUNTER — Ambulatory Visit
Admission: RE | Admit: 2019-03-06 | Discharge: 2019-03-06 | Disposition: A | Discharge status: Home / Self Care | Payer: BC Managed Care – PPO | Source: Ambulatory Visit | Attending: Urology | Admitting: Urology

## 2019-03-06 ENCOUNTER — Other Ambulatory Visit: Payer: BC Managed Care – PPO

## 2019-03-06 ENCOUNTER — Other Ambulatory Visit: Payer: Self-pay

## 2019-03-06 DIAGNOSIS — C61 Malignant neoplasm of prostate: Secondary | ICD-10-CM | POA: Insufficient documentation

## 2019-03-06 LAB — POCT I-STAT CREATININE: Creatinine, Ser: 1.2 mg/dL (ref 0.61–1.24)

## 2019-03-06 MED ORDER — GADOBUTROL 1 MMOL/ML IV SOLN
10.0000 mL | Freq: Once | INTRAVENOUS | Status: AC | PRN
  Administered 2019-03-06: 10 mL via INTRAVENOUS

## 2019-03-11 ENCOUNTER — Ambulatory Visit (INDEPENDENT_AMBULATORY_CARE_PROVIDER_SITE_OTHER): Payer: BC Managed Care – PPO | Admitting: Urology

## 2019-03-11 ENCOUNTER — Other Ambulatory Visit: Payer: Self-pay

## 2019-03-11 ENCOUNTER — Encounter: Payer: Self-pay | Admitting: Urology

## 2019-03-11 VITALS — BP 164/85 | HR 60 | Ht 72.0 in | Wt 261.0 lb

## 2019-03-11 DIAGNOSIS — C61 Malignant neoplasm of prostate: Secondary | ICD-10-CM | POA: Diagnosis not present

## 2019-03-11 NOTE — Progress Notes (Signed)
03/11/2019 10:38 AM   Philip Arnold. 05-Apr-1961 [id number]  Referring provider: [name], MD [place],  [place] [address]  Chief Complaint  Patient presents with  . Prostate Cancer    58mo w/PSA&MRI    HPI: 58 year old male who returns to the office for active surveillance of his low volume intermediate risk prostate cancer.  He underwent Prostate biopsy on 08/19/2018 which was uncomplicated.  Biopsy results revealed 1 single core of Gleason 3+4 involving 9% of the tissue at the right lateral apex.  There is also an atypical focus of suspicious appearing cells at the left apex but not diagnostic of prostate cancer.  Remainder of the prostate was benign.   TRUS volume 36.2 mL  In the interim, his PSA continues to rise.  It was around 7.0 at the time of biopsy and now is risen to 7.9.  He is also undergone interval prostate MRI on 03/06/2019.  This reveals 1.6 cm PI-RADS 5 lesion in the left anterior peripheral zone extending into the apex.  There is contact with the capsule but no obvious trans-capsular extension.  Normal lymph nodes.  He has minimal urinary symptoms.  No baseline erectile dysfunction.  No previous abdominal surgeries.   PMH: Past Medical History:  Diagnosis Date  . Elevated PSA   . GERD (gastroesophageal reflux disease)   . Hip pain   . Hyperglycemia   . Hypertension   . Obesity     Surgical History: No past surgical history on file.  Home Medications:  Allergies as of 03/11/2019   No Known Allergies     Medication List       Accurate as of March 11, 2019 10:38 AM. If you have any questions, ask your nurse or doctor.        amLODipine 10 MG tablet Commonly known as: NORVASC take 1 tablet by mouth once daily   aspirin EC 81 MG tablet Take by mouth.   metoprolol succinate 100 MG 24 hr tablet Commonly known as: TOPROL-XL take 1 tablet by mouth once daily   potassium chloride 10 MEQ tablet Commonly known  as: K-DUR Take 10 mEq by mouth every other day.   PriLOSEC OTC 20 MG tablet Generic drug: omeprazole Take by mouth.   simvastatin 20 MG tablet Commonly known as: ZOCOR take 1 tablet by mouth at bedtime   tamsulosin 0.4 MG Caps capsule Commonly known as: FLOMAX TK ONE C PO  QD 30 MINUTES AFTER THE SAME MEAL EACH DAY   triamterene-hydrochlorothiazide 37.5-25 MG capsule Commonly known as: DYAZIDE take 1 capsule by mouth once daily   Vitamin D3 50 MCG (2000 UT) capsule Take by mouth.       Allergies: No Known Allergies  Family History: Family History  Problem Relation Age of Onset  . Bone cancer Mother   . Hypertension Father   . Stroke Father     Social History:  reports that he has never smoked. He has never used smokeless tobacco. He reports current alcohol use. He reports that he does not use drugs.  ROS: UROLOGY Frequent Urination?: No Hard to postpone urination?: No Burning/pain with urination?: No Get up at night to urinate?: No Leakage of urine?: No Urine stream starts and stops?: No Trouble starting stream?: No Do you have to strain to urinate?: No Blood in urine?: No Urinary tract infection?: No Sexually transmitted disease?: No Injury to kidneys or bladder?: No Painful intercourse?: No Weak stream?: No Erection problems?:  No Penile pain?: No  Gastrointestinal Nausea?: No Vomiting?: No Indigestion/heartburn?: No Diarrhea?: No Constipation?: No  Constitutional Fever: No Night sweats?: No Weight loss?: No Fatigue?: No  Skin Skin rash/lesions?: Yes Itching?: No  Eyes Blurred vision?: No Double vision?: No  Ears/Nose/Throat Sore throat?: No Sinus problems?: No  Hematologic/Lymphatic Swollen glands?: No Easy bruising?: No  Cardiovascular Leg swelling?: No Chest pain?: No  Respiratory Cough?: No Shortness of breath?: No  Endocrine Excessive thirst?: No  Musculoskeletal Back pain?: No Joint pain?: No  Neurological  Headaches?: No Dizziness?: No  Psychologic Depression?: No Anxiety?: No  Physical Exam: BP (!) 164/85   Pulse 60   Ht 6' (1.829 m)   Wt 261 lb (118.4 kg)   BMI 35.40 kg/m   Constitutional:  Alert and oriented, No acute distress. HEENT: [place] AT, moist mucus membranes.  Trachea midline, no masses. Cardiovascular: No clubbing, cyanosis, or edema. Respiratory: Normal respiratory effort, no increased work of breathing. GI: Abdomen is soft, nontender, nondistended, no abdominal masses, obese with pannus, no umbilical hernias appreciated.  No abdominal scars. Skin: No rashes, bruises or suspicious lesions. Neurologic: Grossly intact, no focal deficits, moving all 4 extremities. Psychiatric: Normal mood and affect.  Laboratory Data: Lab Results  Component Value Date   CREATININE 1.20 03/06/2019    Pertinent Imaging: CLINICAL DATA:  Recent elevation of PSA level at 7.9. Gleason 3+4=7 prostate adenocarcinoma in 9% of a core at the left apex on 08/19/2018.  EXAM: MR PROSTATE WITHOUT AND WITH CONTRAST  TECHNIQUE: Multiplanar multisequence MRI images were obtained of the pelvis centered about the prostate. Pre and post contrast images were obtained.  CONTRAST:  18mL GADAVIST GADOBUTROL 1 MMOL/ML IV SOLN  COMPARISON:  None.  FINDINGS: Prostate:  Region of interest # 1: PI-RADS category 5 lesion of the left anterior peripheral zone of the mid gland extending into the apex with low ADC map activity, very high diffusion weighted restriction, and early enhancement. This lesion measures 1.7 by 1.1 by 1.4 cm (0.92 cubic cm).  Otherwise there is hazy and nonfocal T2 stranding throughout the peripheral zone of the prostate gland bilaterally with generalized enhancement.  Volume: 3D volumetric analysis: The prostate measures 4.1 by 3.6 by 4.1 cm (29.28 cubic cm).  Transcapsular spread: Not directly visualized, but there is 1.6 cm of contact of the lesion designated in  region of interest # 1 with the capsular surface, which does increase risk of occult transcapsular spread.  Seminal vesicle involvement: Absent  Neurovascular bundle involvement: Absent  Pelvic adenopathy: Absent  Bone metastasis: Absent  Other findings: Mild sigmoid colon diverticulosis.  IMPRESSION: 1. PI-RADS category 5 lesion of the left anterior peripheral zone in mid gland extending into the apex. Appropriate targeting data sent to [facility]. This lesion has 1.6 cm with contact with the capsule, and although trans capsular spread is not directly identified, this magnitude of contact with the capsule does increase risk of occult trans capsular spread. 2. Mild sigmoid colon diverticulosis.   Electronically Signed   By: Van Clines M.D.   On: 03/06/2019 10:07  MRI images were personally reviewed today.  Agree with radiology interpretation.  Assessment & Plan:    1. Prostate cancer [facility]) 58 year old male with intermediate risk prostate cancer, 3+4 low-volume involving 1 core, 9% the tissue.  MRI confirms presence of tumor at the left base but majority of the tumor is in the left anterior peripheral zone, likely under sampled at the time of initial biopsy given the location of  the tumor.  Additionally, the tumor is radiographically more extensive than biopsy suggest.  There is evidence that it abuts the capsule extensively but without evidence of radiographic extracapsular extension.  No lymphadenopathy which is reassuring.  We discussed today based on these findings as well as his continued rising PSA, I do not feel that he is any longer a good candidate for active surveillance.  I more strongly recommended definitive management for localized prostate cancer in the form of prostatectomy versus radiation with ADT x6 months.  We reviewed the risks and benefits of each of these today extensively.  He does appear to be a fairly good surgical candidate, he has significant  obesity but is otherwise young without previous abdominal surgery.  His obesity does make him high risk for stress incontinence and would recommend preoperative weight loss if possible.  He was offered a referral to radiation oncology discussed these options were essentially declined again today.  He is leaning toward surgery.  He would like his wife to be involved in this discussion.  She is at work right now.  He would like to schedule a virtual visit to continue this discussion with his wife present, will arrange for virtual visit next week.  Hollice Espy, MD  [facility] [address], [place], [place] [address] [phone]  I spent 25 min with this patient of which greater than 50% was spent in counseling and coordination of care with the patient.

## 2019-03-17 ENCOUNTER — Other Ambulatory Visit: Payer: Self-pay

## 2019-03-17 ENCOUNTER — Telehealth (INDEPENDENT_AMBULATORY_CARE_PROVIDER_SITE_OTHER): Payer: BC Managed Care – PPO | Admitting: Urology

## 2019-03-17 NOTE — Progress Notes (Signed)
Patient was unavailable for virtual visit today.  Will plan to reschedule.   Hollice Espy, MD

## 2019-03-24 ENCOUNTER — Telehealth (INDEPENDENT_AMBULATORY_CARE_PROVIDER_SITE_OTHER): Payer: BC Managed Care – PPO | Admitting: Urology

## 2019-03-24 ENCOUNTER — Other Ambulatory Visit: Payer: Self-pay

## 2019-03-24 DIAGNOSIS — C61 Malignant neoplasm of prostate: Secondary | ICD-10-CM | POA: Diagnosis not present

## 2019-03-24 NOTE — Progress Notes (Signed)
Virtual Visit via Telephone Note  I connected with Philip Arnold. on 03/24/19 at  4:15 PM EDT by telephone and verified that I am speaking with the correct person using two identifiers.  Location: Patient: home with wife Provider: office   I discussed the limitations, risks, security and privacy concerns of performing an evaluation and management service by telephone and the availability of in person appointments. I also discussed with the patient that there may be a patient responsible charge related to this service. The patient expressed understanding and agreed to proceed.   History of Present Illness: 58 year old male with personal history of intermediate risk prostate cancer, recent MRI with more extensive disease who presents today via virtual telephone visit accompanied by his wife today to discuss his treatment options.  Please see previous notes for all details in previous discussion.  Clinical situation was reiterated today and recommendations for treatment options.  All questions were answered at length.  He and his wife have opted for surgery.  They have declined radiation oncology consult.  All questions were answered today extensively.   Observations/Objective: Pleasant, interactive, asking intelligent questions, wife available by speaker phone as well  Assessment and Plan:   1. Prostate cancer ([place]) Intermediate risk prostate cancer  Electing for robotic prostatectomy.  We discussed surgical therapy for prostate cancer including the different available surgical approaches. We discussed, in detail, the risks and expectations of surgery with regard to cancer control, urinary control, and erectile dysfunction as well as expected post operative cover he processed. Additional risks of surgery including but not limited to bleeding, infection, hernia, nerve damage,  bowel/rectal injury, potentially necessitating colostomy, damage to the urinary tract resulting in urinary  leakage, urethral stricture, and cardiopulmonary risk such as myocardial infarction, stroke, death, thromboembolism etc. were explained.   He will be referred to physical therapy prior to surgery for pelvic floor teaching.   Follow Up Instructions: Schedule surgery   I discussed the assessment and treatment plan with the patient. The patient was provided an opportunity to ask questions and all were answered. The patient agreed with the plan and demonstrated an understanding of the instructions.   The patient was advised to call back or seek an in-person evaluation if the symptoms worsen or if the condition fails to improve as anticipated.  I provided 12 minutes of non-face-to-face time during this encounter.   Hollice Espy, MD

## 2019-03-25 ENCOUNTER — Other Ambulatory Visit: Payer: Self-pay | Admitting: Radiology

## 2019-04-07 ENCOUNTER — Other Ambulatory Visit: Payer: Self-pay | Admitting: Radiology

## 2019-04-07 DIAGNOSIS — C61 Malignant neoplasm of prostate: Secondary | ICD-10-CM

## 2019-04-07 DIAGNOSIS — Z01818 Encounter for other preprocedural examination: Secondary | ICD-10-CM

## 2019-04-14 ENCOUNTER — Ambulatory Visit: Payer: BC Managed Care – PPO | Attending: Urology | Admitting: Physical Therapy

## 2019-04-14 ENCOUNTER — Other Ambulatory Visit: Payer: Self-pay

## 2019-04-14 DIAGNOSIS — M6281 Muscle weakness (generalized): Secondary | ICD-10-CM | POA: Insufficient documentation

## 2019-04-14 DIAGNOSIS — R278 Other lack of coordination: Secondary | ICD-10-CM | POA: Insufficient documentation

## 2019-04-14 NOTE — Patient Instructions (Addendum)
Deep core level 1 and 2   3 x day    __   Avoid straining pelvic floor, abdominal muscles , spine  Use log rolling technique instead of getting out of bed with your neck or the sit-up     Log rolling into and out of bed   Log rolling into and out of bed If getting out of bed on R side, Bent knees, scoot hips/ shoulder to L  Raise R arm completely overhead, rolling onto armpit  Then lower bent knees to bed to get into complete side lying position  Then drop legs off bed, and push up onto R elbow/forearm, and use L hand to push onto the bed   ___   Proper body mechanics with getting out of a chair to decrease strain  on back &pelvic floor   Avoid holding your breath when Getting out of the chair:  Scoot to front part of chair chair Heels behind feet, feet are hip width apart, nose over toes  Inhale like you are smelling roses Exhale to stand    __  Walking regularly

## 2019-04-15 ENCOUNTER — Other Ambulatory Visit: Payer: BC Managed Care – PPO

## 2019-04-15 ENCOUNTER — Other Ambulatory Visit: Payer: Self-pay

## 2019-04-15 DIAGNOSIS — Z01818 Encounter for other preprocedural examination: Secondary | ICD-10-CM

## 2019-04-15 DIAGNOSIS — C61 Malignant neoplasm of prostate: Secondary | ICD-10-CM

## 2019-04-15 NOTE — Therapy (Signed)
[place] MAIN [facility] SERVICES [address] [place], [place], [address] Phone: [phone]   Fax:  [phone]  Physical Therapy Evaluation  Patient Details  Name: Philip Arnold. MRN: [id number] Date of Birth: [birth year] Referring Provider (PT): Erlene Quan   Encounter Date: 04/14/2019  PT End of Session - 04/15/19 1415    Visit Number  1    Number of Visits  2    PT Start Time  1400    PT Stop Time  1455    PT Time Calculation (min)  55 min       Past Medical History:  Diagnosis Date  . Elevated PSA   . GERD (gastroesophageal reflux disease)   . Hip pain   . Hyperglycemia   . Hypertension   . Obesity     No past surgical history on file.  There were no vitals filed for this visit.   Subjective Assessment - 04/14/19 1409    Subjective  Pt has surgery scehduled for prostate CA on 04/24/19. 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 . Pt denied cosntipation, bowel issues, LBP, falls in the past,  no surgeries. Pt is trying to get back to his walking.   .    Patient Stated Goals  to get out of wearing depends at quick as possible         [facility] PT Assessment - 04/14/19 1419      Assessment   Medical Diagnosis  Prostate Cancer     Referring Provider (PT)  Erlene Quan      Precautions   Precautions  None      Restrictions   Weight Bearing Restrictions  No      Balance Screen   Has the patient fallen in the past 6 months  No      Coordination   Coordination and Movement Description  diaphragm excursion present with deep core       Sit to Stand   Comments  breathholding       Strength   Overall Strength Comments  hip flex/kneeflex/ext 4/5 B       Bed Mobility   Bed Mobility  --   half crunchmethod, straining               Objective measurements completed on examination: See above findings.    Pelvic Floor Special Questions - 04/14/19 1426     Diastasis Recti  neg     External Perineal Exam  through clothing, no mm tightness, proper lengthening and upward motion on cue for exhalation,        [facility] Adult PT Treatment/Exercise - 04/15/19 1421      Bed Mobility   Bed Mobility  --   half crunchmethod, straining     Therapeutic Activites    Therapeutic Activities  --   education about optimal pelvic floor function     Neuro Re-ed    Neuro Re-ed Details   cued for deep core level 1 and 2 technique and log rolling ,sit to stand                PT Short Term Goals - 04/15/19 1416      PT SHORT TERM GOAL #1   Title  Pt will demo IND with pelvic floor exercises in supine and seated positions to minimize risk for urinary incontinence    Time  2    Period  Weeks    Status  New    Target Date  04/29/19      PT SHORT TERM GOAL #2   Title  Pt will demo proper alignment and technique with sitting, logrolling, sit to stand, lifting, body mechanics with ADLs to minimize straining of abdominopelvic floor mm and spine.    Time  1    Period  Weeks    Status  New    Target Date  04/22/19      PT SHORT TERM GOAL #3   Title  Pt will demo proper technique for deepc ore coordination and strengthening without cuing to increase intraabdominal pressure for better pelvic floor function    Time  1    Period  Weeks    Status  New    Target Date  04/22/19                Plan - 04/15/19 1416    Clinical Impression Statement  Pt is a  58  yo male who is scheduled for prostatectomy on 04/24/19. Pt was referred to [facility]vic [facility] PT to train his pelvic floor mm to yield better outcomes with continence post-surgery. Pt 's clinical presentations included signs of poor intraabdominal pressure system which is associated increased risk for urinary incontinence: _weakness deep core mm _lack of understanding on movement patterns that places less strain on the abdomen/pelvic floor.   Pt was provided education on etiology of Sx with  anatomy, physiology explanation with images along with the benefits of customized pelvic PT Tx based on pt's medical conditions and musculoskeletal deficits.   Following Tx, pt demo'd improved proper deep core mm coordination and IND with deep core strengthening exercises. Pt also demo'd improved body mechanics with movement patterns that places less strain on pelvic floor.  Plan to educate pt on pelvic floor strengthening exercises next session to yield better outcome post prostrate surgery with continence.      Stability/Clinical Decision Making  Stable/Uncomplicated    Clinical Decision Making  Low    Rehab Potential  Good    PT Frequency  1x / week    PT Duration  2 weeks    PT Treatment/Interventions  Neuromuscular re-education;Balance training;Functional mobility training;Patient/family education;Therapeutic exercise;Therapeutic activities;Manual techniques;Manual lymph drainage;Taping;Scar mobilization    Consulted and Agree with Plan of Care  Patient       Patient will benefit from skilled therapeutic intervention in order to improve the following deficits and impairments:  Improper body mechanics, Decreased mobility, Decreased strength, Decreased endurance  Visit Diagnosis: Other lack of coordination  Muscle weakness (generalized)     Problem List Patient Active Problem List   Diagnosis Date Noted  . Hip pain 04/29/2018  . Hyperglycemia 04/29/2018  . Hypertension 04/29/2018  . Obesity (BMI 30-39.9) 04/29/2018  . Pure hypercholesterolemia 04/29/2018  . Elevated PSA 12/05/2016    Jerl Mina ,PT, DPT, E-RYT  04/15/2019, 2:22 PM  [place] MAIN [facility] SERVICES [address] [place], [place], [address] Phone: [phone]   Fax:  [phone]  Name: Philip Arnold. MRN: [id number] Date of Birth: Mar 26, 1961

## 2019-04-16 LAB — MICROSCOPIC EXAMINATION
Bacteria, UA: NONE SEEN
Epithelial Cells (non renal): NONE SEEN /hpf (ref 0–10)
RBC, Urine: NONE SEEN /hpf (ref 0–2)

## 2019-04-16 LAB — URINALYSIS, COMPLETE
Bilirubin, UA: NEGATIVE
Glucose, UA: NEGATIVE
Ketones, UA: NEGATIVE
Leukocytes,UA: NEGATIVE
Nitrite, UA: NEGATIVE
Protein,UA: NEGATIVE
RBC, UA: NEGATIVE
Specific Gravity, UA: 1.015 (ref 1.005–1.030)
Urobilinogen, Ur: 0.2 mg/dL (ref 0.2–1.0)
pH, UA: 6.5 (ref 5.0–7.5)

## 2019-04-17 ENCOUNTER — Other Ambulatory Visit
Admission: RE | Admit: 2019-04-17 | Discharge: 2019-04-17 | Disposition: A | Discharge status: Home / Self Care | Payer: BC Managed Care – PPO | Source: Ambulatory Visit | Attending: Urology | Admitting: Urology

## 2019-04-17 ENCOUNTER — Other Ambulatory Visit: Payer: Self-pay

## 2019-04-17 DIAGNOSIS — I10 Essential (primary) hypertension: Secondary | ICD-10-CM | POA: Insufficient documentation

## 2019-04-17 DIAGNOSIS — E785 Hyperlipidemia, unspecified: Secondary | ICD-10-CM | POA: Diagnosis not present

## 2019-04-17 DIAGNOSIS — C61 Malignant neoplasm of prostate: Secondary | ICD-10-CM | POA: Insufficient documentation

## 2019-04-17 DIAGNOSIS — Z79899 Other long term (current) drug therapy: Secondary | ICD-10-CM | POA: Insufficient documentation

## 2019-04-17 DIAGNOSIS — Z01818 Encounter for other preprocedural examination: Secondary | ICD-10-CM | POA: Insufficient documentation

## 2019-04-17 DIAGNOSIS — K219 Gastro-esophageal reflux disease without esophagitis: Secondary | ICD-10-CM | POA: Diagnosis not present

## 2019-04-17 HISTORY — DX: Personal history of urinary calculi: Z87.442

## 2019-04-17 HISTORY — DX: Malignant (primary) neoplasm, unspecified: C80.1

## 2019-04-17 LAB — CBC
HCT: 43.8 % (ref 39.0–52.0)
Hemoglobin: 14.4 g/dL (ref 13.0–17.0)
MCH: 28.1 pg (ref 26.0–34.0)
MCHC: 32.9 g/dL (ref 30.0–36.0)
MCV: 85.5 fL (ref 80.0–100.0)
Platelets: 307 10*3/uL (ref 150–400)
RBC: 5.12 MIL/uL (ref 4.22–5.81)
RDW: 15.6 % — ABNORMAL HIGH (ref 11.5–15.5)
WBC: 8.1 10*3/uL (ref 4.0–10.5)
nRBC: 0 % (ref 0.0–0.2)

## 2019-04-17 LAB — TYPE AND SCREEN
ABO/RH(D): O POS
Antibody Screen: NEGATIVE

## 2019-04-17 LAB — PROTIME-INR
INR: 1.1 (ref 0.8–1.2)
Prothrombin Time: 14.2 seconds (ref 11.4–15.2)

## 2019-04-17 LAB — BASIC METABOLIC PANEL
Anion gap: 11 (ref 5–15)
BUN: 13 mg/dL (ref 6–20)
CO2: 29 mmol/L (ref 22–32)
Calcium: 9.4 mg/dL (ref 8.9–10.3)
Chloride: 102 mmol/L (ref 98–111)
Creatinine, Ser: 1.06 mg/dL (ref 0.61–1.24)
GFR calc Af Amer: >60 mL/min (ref >60)
GFR calc non Af Amer: >60 mL/min (ref >60)
Glucose, Bld: 93 mg/dL (ref 70–99)
Potassium: 2.9 mmol/L — ABNORMAL LOW (ref 3.5–5.1)
Sodium: 142 mmol/L (ref 135–145)

## 2019-04-17 NOTE — Pre-Procedure Instructions (Addendum)
Pre-Admit Testing Provider Communication Note  Provider Notified: Dr. Lavone Neri (Anesthesiologist)  Notification Mode: Secure Chat  Reason: Abnormal EKG "Dr. Lavone Neri, this patient was seen in PAT this morning and had an EKG. Can you look at it and advise? There are no previous to compare."  Response: "He saw his internist yesterday for preop eval, I don't see any contraindication to surgery, so we'll proceed"   Additional Information:  EKG on Chart. Noted on Pre-Admit Worksheet.  Signed: Beulah Gandy, RN

## 2019-04-17 NOTE — Patient Instructions (Signed)
Your procedure is scheduled on: Friday 04/24/19.  Report to DAY SURGERY DEPARTMENT LOCATED ON 2ND FLOOR MEDICAL MALL ENTRANCE. To find out your arrival time please call [phone] between 1PM - 3PM on Thursday 04/23/19.   Remember: Instructions that are not followed completely may result in serious medical risk, up to and including death, or upon the discretion of your surgeon and anesthesiologist your surgery may need to be rescheduled.      _X__ 1. Do not eat food after midnight the night before your procedure.                 No gum chewing or hard candies. You may drink clear liquids up to 2 hours                 before you are scheduled to arrive for your surgery- DO NOT drink clear                 liquids within 2 hours of the start of your surgery.                 Clear Liquids include:  water, apple juice without pulp, clear carbohydrate                 drink such as Clearfast or Gatorade, Black Coffee or Tea (Do not add                 milk or creamer to coffee or tea).    __X__2.  On the morning of surgery brush your teeth with toothpaste and water, you may rinse your mouth with mouthwash if you wish.  Do not swallow any toothpaste or mouthwash.      _X__ 3.  No Alcohol for 24 hours before or after surgery.    __X__4.  Notify your doctor if there is any change in your medical condition      (cold, fever, infections).     Do not wear jewelry, make-up, hairpins, clips or nail polish. Do not wear lotions, powders, or perfumes.  Do not shave 48 hours prior to surgery. Men may shave face and neck. Do not bring valuables to the hospital.      [facility] is not responsible for any belongings or valuables.    Contacts, dentures/partials or body piercings may not be worn into surgery. Bring a case for your contacts, glasses or hearing aids, a denture cup will be supplied.    __X__ Take these medicines the morning of surgery with A SIP OF WATER:     1. amLODipine  (NORVASC) 10 MG tablet  2. metoprolol succinate (TOPROL-XL) 100 MG 24 hr tablet  3. omeprazole (PRILOSEC OTC) 20 MG tablet  4. simvastatin (ZOCOR) 20 MG tablet  5. fluticasone (FLONASE) 50 MCG/ACT nasal spray if needed  6. hydroxypropyl methylcellulose / hypromellose (ISOPTO TEARS / GONIOVISC) 2.5 % ophthalmic solution if needed    __X__ Use CHG Soap as directed    __X__ Stop Blood Thinners: Aspirin. You have already stopped taking your Aspirin according to Dr. Cherrie Gauze instruction.   __X__ Stop Anti-inflammatories 7 days before surgery such as Advil, Ibuprofen, Motrin, BC or Goodies Powder, Naprosyn, Naproxen, Aleve, Aspirin, Meloxicam. May take Tylenol if needed for pain or discomfort.    __X__ Don't start taking any new herbal supplements before your procedure.

## 2019-04-17 NOTE — Pre-Procedure Instructions (Signed)
Pre-Admit Testing Provider Notification Note  Provider Notified: Dr. Erlene Quan  Notification Mode: Fax  Reason: Abnormal BMP Results.  Response: Fax confirmation received.  Additional Information: Placed on Chart. Noted on Pre-Admit Worksheet.  Signed: Beulah Gandy, RN

## 2019-04-18 LAB — CULTURE, URINE COMPREHENSIVE

## 2019-04-21 ENCOUNTER — Ambulatory Visit: Payer: BC Managed Care – PPO | Admitting: Physical Therapy

## 2019-04-21 ENCOUNTER — Other Ambulatory Visit
Admission: RE | Admit: 2019-04-21 | Discharge: 2019-04-21 | Disposition: A | Discharge status: Home / Self Care | Payer: BC Managed Care – PPO | Source: Ambulatory Visit | Attending: Urology | Admitting: Urology

## 2019-04-21 ENCOUNTER — Other Ambulatory Visit: Payer: Self-pay

## 2019-04-21 DIAGNOSIS — Z01812 Encounter for preprocedural laboratory examination: Secondary | ICD-10-CM | POA: Diagnosis present

## 2019-04-21 DIAGNOSIS — Z20828 Contact with and (suspected) exposure to other viral communicable diseases: Secondary | ICD-10-CM | POA: Diagnosis not present

## 2019-04-21 LAB — SARS CORONAVIRUS 2 (TAT 6-24 HRS): SARS Coronavirus 2: NEGATIVE

## 2019-04-24 ENCOUNTER — Encounter
Admission: RE | Disposition: A | Discharge status: Home / Self Care | Payer: Self-pay | Source: Home / Self Care | Attending: Urology

## 2019-04-24 ENCOUNTER — Ambulatory Visit: Payer: BC Managed Care – PPO | Admitting: Anesthesiology

## 2019-04-24 ENCOUNTER — Encounter: Payer: Self-pay | Admitting: *Deleted

## 2019-04-24 ENCOUNTER — Observation Stay
Admission: RE | Admit: 2019-04-24 | Discharge: 2019-04-25 | Disposition: A | Discharge status: Home / Self Care | Payer: BC Managed Care – PPO | Attending: Urology | Admitting: Urology

## 2019-04-24 ENCOUNTER — Other Ambulatory Visit: Payer: Self-pay

## 2019-04-24 DIAGNOSIS — E669 Obesity, unspecified: Secondary | ICD-10-CM | POA: Insufficient documentation

## 2019-04-24 DIAGNOSIS — Z79899 Other long term (current) drug therapy: Secondary | ICD-10-CM | POA: Insufficient documentation

## 2019-04-24 DIAGNOSIS — Z8249 Family history of ischemic heart disease and other diseases of the circulatory system: Secondary | ICD-10-CM | POA: Insufficient documentation

## 2019-04-24 DIAGNOSIS — Z7982 Long term (current) use of aspirin: Secondary | ICD-10-CM | POA: Insufficient documentation

## 2019-04-24 DIAGNOSIS — C61 Malignant neoplasm of prostate: Secondary | ICD-10-CM | POA: Diagnosis present

## 2019-04-24 DIAGNOSIS — K219 Gastro-esophageal reflux disease without esophagitis: Secondary | ICD-10-CM | POA: Insufficient documentation

## 2019-04-24 DIAGNOSIS — Z6835 Body mass index (BMI) 35.0-35.9, adult: Secondary | ICD-10-CM | POA: Insufficient documentation

## 2019-04-24 DIAGNOSIS — I10 Essential (primary) hypertension: Secondary | ICD-10-CM | POA: Diagnosis not present

## 2019-04-24 HISTORY — PX: ROBOT ASSISTED LAPAROSCOPIC RADICAL PROSTATECTOMY: SHX5141

## 2019-04-24 HISTORY — PX: PELVIC LYMPH NODE DISSECTION: SHX6543

## 2019-04-24 LAB — CREATININE, SERUM
Creatinine, Ser: 1.39 mg/dL — ABNORMAL HIGH (ref 0.61–1.24)
GFR calc Af Amer: >60 mL/min (ref >60)
GFR calc non Af Amer: 55 mL/min — ABNORMAL LOW (ref >60)

## 2019-04-24 LAB — POCT I-STAT, CHEM 8
BUN: 12 mg/dL (ref 6–20)
Calcium, Ion: 1.16 mmol/L (ref 1.15–1.40)
Chloride: 101 mmol/L (ref 98–111)
Creatinine, Ser: 1.2 mg/dL (ref 0.61–1.24)
Glucose, Bld: 119 mg/dL — ABNORMAL HIGH (ref 70–99)
HCT: 46 % (ref 39.0–52.0)
Hemoglobin: 15.6 g/dL (ref 13.0–17.0)
Potassium: 2.9 mmol/L — ABNORMAL LOW (ref 3.5–5.1)
Sodium: 142 mmol/L (ref 135–145)
TCO2: 26 mmol/L (ref 22–32)

## 2019-04-24 LAB — CBC
HCT: 41.7 % (ref 39.0–52.0)
Hemoglobin: 14.2 g/dL (ref 13.0–17.0)
MCH: 28.2 pg (ref 26.0–34.0)
MCHC: 34.1 g/dL (ref 30.0–36.0)
MCV: 82.9 fL (ref 80.0–100.0)
Platelets: 334 10*3/uL (ref 150–400)
RBC: 5.03 MIL/uL (ref 4.22–5.81)
RDW: 15.4 % (ref 11.5–15.5)
WBC: 17 10*3/uL — ABNORMAL HIGH (ref 4.0–10.5)
nRBC: 0 % (ref 0.0–0.2)

## 2019-04-24 LAB — POTASSIUM: Potassium: 2.9 mmol/L — ABNORMAL LOW (ref 3.5–5.1)

## 2019-04-24 LAB — ABO/RH: ABO/RH(D): O POS

## 2019-04-24 SURGERY — PROSTATECTOMY, RADICAL, ROBOT-ASSISTED, LAPAROSCOPIC
Anesthesia: General

## 2019-04-24 MED ORDER — CEFAZOLIN SODIUM-DEXTROSE 1-4 GM/50ML-% IV SOLN
1.0000 g | Freq: Three times a day (TID) | INTRAVENOUS | Status: AC
  Administered 2019-04-24 – 2019-04-25 (×2): 1 g via INTRAVENOUS
  Filled 2019-04-24 (×2): qty 50

## 2019-04-24 MED ORDER — OXYBUTYNIN CHLORIDE 5 MG PO TABS
5.0000 mg | ORAL_TABLET | Freq: Three times a day (TID) | ORAL | Status: DC | PRN
  Administered 2019-04-24: 5 mg via ORAL
  Filled 2019-04-24 (×2): qty 1

## 2019-04-24 MED ORDER — DOCUSATE SODIUM 100 MG PO CAPS: 100.0000 mg | ORAL_CAPSULE | Freq: Two times a day (BID) | ORAL | 0 refills | Status: DC

## 2019-04-24 MED ORDER — PROPOFOL 10 MG/ML IV BOLUS
INTRAVENOUS | Status: AC
  Filled 2019-04-24: qty 20

## 2019-04-24 MED ORDER — HEPARIN SODIUM (PORCINE) 5000 UNIT/ML IJ SOLN
5000.0000 [IU] | Freq: Three times a day (TID) | INTRAMUSCULAR | Status: DC
  Administered 2019-04-24 – 2019-04-25 (×2): 5000 [IU] via SUBCUTANEOUS
  Filled 2019-04-24 (×2): qty 1

## 2019-04-24 MED ORDER — MIDAZOLAM HCL 2 MG/2ML IJ SOLN
INTRAMUSCULAR | Status: DC | PRN
  Administered 2019-04-24: 2 mg via INTRAVENOUS

## 2019-04-24 MED ORDER — PROPOFOL 10 MG/ML IV BOLUS
INTRAVENOUS | Status: DC | PRN
  Administered 2019-04-24: 180 mg via INTRAVENOUS

## 2019-04-24 MED ORDER — OXYBUTYNIN CHLORIDE 5 MG PO TABS: 5.0000 mg | ORAL_TABLET | Freq: Three times a day (TID) | ORAL | 0 refills | Status: DC | PRN

## 2019-04-24 MED ORDER — OXYCODONE-ACETAMINOPHEN 5-325 MG PO TABS
1.0000 | ORAL_TABLET | ORAL | Status: DC | PRN
  Administered 2019-04-24: 1 via ORAL
  Filled 2019-04-24: qty 1

## 2019-04-24 MED ORDER — CHLORHEXIDINE GLUCONATE CLOTH 2 % EX PADS
6.0000 | MEDICATED_PAD | Freq: Every day | CUTANEOUS | Status: DC
  Administered 2019-04-24 – 2019-04-25 (×2): 6 via TOPICAL

## 2019-04-24 MED ORDER — LACTATED RINGERS IV SOLN
INTRAVENOUS | Status: DC
  Administered 2019-04-24: 09:00:00 via INTRAVENOUS

## 2019-04-24 MED ORDER — METOPROLOL SUCCINATE ER 25 MG PO TB24
100.0000 mg | ORAL_TABLET | Freq: Every day | ORAL | Status: DC
  Administered 2019-04-25: 100 mg via ORAL
  Filled 2019-04-24: qty 4

## 2019-04-24 MED ORDER — BUPIVACAINE HCL (PF) 0.5 % IJ SOLN
INTRAMUSCULAR | Status: AC
  Filled 2019-04-24: qty 30

## 2019-04-24 MED ORDER — POTASSIUM CHLORIDE CRYS ER 10 MEQ PO TBCR
10.0000 meq | EXTENDED_RELEASE_TABLET | Freq: Every day | ORAL | Status: DC
  Administered 2019-04-24 – 2019-04-25 (×2): 10 meq via ORAL
  Filled 2019-04-24 (×2): qty 1

## 2019-04-24 MED ORDER — POTASSIUM CHLORIDE CRYS ER 20 MEQ PO TBCR
40.0000 meq | EXTENDED_RELEASE_TABLET | Freq: Once | ORAL | Status: AC
  Administered 2019-04-24: 40 meq via ORAL

## 2019-04-24 MED ORDER — DEXAMETHASONE SODIUM PHOSPHATE 10 MG/ML IJ SOLN
INTRAMUSCULAR | Status: AC
  Filled 2019-04-24: qty 1

## 2019-04-24 MED ORDER — FENTANYL CITRATE (PF) 100 MCG/2ML IJ SOLN
INTRAMUSCULAR | Status: AC
  Administered 2019-04-24: 13:00:00 25 ug via INTRAVENOUS
  Filled 2019-04-24: qty 2

## 2019-04-24 MED ORDER — POTASSIUM CHLORIDE CRYS ER 20 MEQ PO TBCR
EXTENDED_RELEASE_TABLET | ORAL | Status: AC
  Filled 2019-04-24: qty 2

## 2019-04-24 MED ORDER — CEFAZOLIN SODIUM-DEXTROSE 2-4 GM/100ML-% IV SOLN
INTRAVENOUS | Status: AC
  Filled 2019-04-24: qty 100

## 2019-04-24 MED ORDER — ROCURONIUM BROMIDE 100 MG/10ML IV SOLN
INTRAVENOUS | Status: DC | PRN
  Administered 2019-04-24: 50 mg via INTRAVENOUS
  Administered 2019-04-24: 10 mg via INTRAVENOUS
  Administered 2019-04-24: 30 mg via INTRAVENOUS
  Administered 2019-04-24 (×2): 20 mg via INTRAVENOUS

## 2019-04-24 MED ORDER — FENTANYL CITRATE (PF) 250 MCG/5ML IJ SOLN
INTRAMUSCULAR | Status: AC
  Filled 2019-04-24: qty 5

## 2019-04-24 MED ORDER — FENTANYL CITRATE (PF) 250 MCG/5ML IJ SOLN
INTRAMUSCULAR | Status: DC | PRN
  Administered 2019-04-24 (×5): 50 ug via INTRAVENOUS

## 2019-04-24 MED ORDER — ZOLPIDEM TARTRATE 5 MG PO TABS
5.0000 mg | ORAL_TABLET | Freq: Every evening | ORAL | Status: DC | PRN
  Filled 2019-04-24: qty 1

## 2019-04-24 MED ORDER — ACETAMINOPHEN 10 MG/ML IV SOLN
INTRAVENOUS | Status: AC
  Filled 2019-04-24: qty 100

## 2019-04-24 MED ORDER — MIDAZOLAM HCL 2 MG/2ML IJ SOLN
INTRAMUSCULAR | Status: AC
  Filled 2019-04-24: qty 2

## 2019-04-24 MED ORDER — ACETAMINOPHEN 325 MG PO TABS: 650.0000 mg | ORAL_TABLET | ORAL | Status: DC | PRN

## 2019-04-24 MED ORDER — SUGAMMADEX SODIUM 200 MG/2ML IV SOLN
INTRAVENOUS | Status: DC | PRN
  Administered 2019-04-24: 100 mg via INTRAVENOUS
  Administered 2019-04-24: 200 mg via INTRAVENOUS

## 2019-04-24 MED ORDER — AMLODIPINE BESYLATE 10 MG PO TABS
10.0000 mg | ORAL_TABLET | Freq: Every day | ORAL | Status: DC
  Administered 2019-04-25: 09:00:00 10 mg via ORAL
  Filled 2019-04-24: qty 1

## 2019-04-24 MED ORDER — LIDOCAINE HCL (PF) 2 % IJ SOLN
INTRAMUSCULAR | Status: AC
  Filled 2019-04-24: qty 10

## 2019-04-24 MED ORDER — DOCUSATE SODIUM 100 MG PO CAPS
100.0000 mg | ORAL_CAPSULE | Freq: Two times a day (BID) | ORAL | Status: DC
  Administered 2019-04-24 – 2019-04-25 (×2): 100 mg via ORAL
  Filled 2019-04-24 (×2): qty 1

## 2019-04-24 MED ORDER — DEXAMETHASONE SODIUM PHOSPHATE 10 MG/ML IJ SOLN
INTRAMUSCULAR | Status: DC | PRN
  Administered 2019-04-24: 10 mg via INTRAVENOUS

## 2019-04-24 MED ORDER — LIDOCAINE HCL (CARDIAC) PF 100 MG/5ML IV SOSY
PREFILLED_SYRINGE | INTRAVENOUS | Status: DC | PRN
  Administered 2019-04-24: 100 mg via INTRATRACHEAL

## 2019-04-24 MED ORDER — TRIAMTERENE-HCTZ 37.5-25 MG PO TABS
1.0000 | ORAL_TABLET | Freq: Every day | ORAL | Status: DC
  Administered 2019-04-24 – 2019-04-25 (×2): 1 via ORAL
  Filled 2019-04-24 (×2): qty 1

## 2019-04-24 MED ORDER — OMEPRAZOLE MAGNESIUM 20 MG PO TBEC: 20.0000 mg | DELAYED_RELEASE_TABLET | Freq: Every day | ORAL | Status: DC

## 2019-04-24 MED ORDER — ONDANSETRON HCL 4 MG/2ML IJ SOLN: 4.0000 mg | Freq: Once | INTRAMUSCULAR | Status: DC | PRN

## 2019-04-24 MED ORDER — FENTANYL CITRATE (PF) 100 MCG/2ML IJ SOLN
INTRAMUSCULAR | Status: AC
  Administered 2019-04-24: 14:00:00 25 ug via INTRAVENOUS
  Filled 2019-04-24: qty 2

## 2019-04-24 MED ORDER — ACETAMINOPHEN 10 MG/ML IV SOLN
INTRAVENOUS | Status: DC | PRN
  Administered 2019-04-24: 1000 mg via INTRAVENOUS

## 2019-04-24 MED ORDER — FENTANYL CITRATE (PF) 100 MCG/2ML IJ SOLN
25.0000 ug | INTRAMUSCULAR | Status: AC | PRN
  Administered 2019-04-24 (×6): 25 ug via INTRAVENOUS

## 2019-04-24 MED ORDER — THROMBIN 5000 UNITS EX SOLR
CUTANEOUS | Status: DC | PRN
  Administered 2019-04-24: 5000 [IU] via TOPICAL

## 2019-04-24 MED ORDER — THROMBIN 5000 UNITS EX SOLR
CUTANEOUS | Status: AC
  Filled 2019-04-24: qty 5000

## 2019-04-24 MED ORDER — PANTOPRAZOLE SODIUM 40 MG PO TBEC
40.0000 mg | DELAYED_RELEASE_TABLET | Freq: Every day | ORAL | Status: DC
  Administered 2019-04-25: 09:00:00 40 mg via ORAL
  Filled 2019-04-24: qty 1

## 2019-04-24 MED ORDER — ONDANSETRON HCL 4 MG/2ML IJ SOLN
INTRAMUSCULAR | Status: AC
  Filled 2019-04-24: qty 2

## 2019-04-24 MED ORDER — ROCURONIUM BROMIDE 50 MG/5ML IV SOLN
INTRAVENOUS | Status: AC
  Filled 2019-04-24: qty 1

## 2019-04-24 MED ORDER — SUCCINYLCHOLINE CHLORIDE 20 MG/ML IJ SOLN
INTRAMUSCULAR | Status: DC | PRN
  Administered 2019-04-24: 120 mg via INTRAVENOUS

## 2019-04-24 MED ORDER — ONDANSETRON HCL 4 MG/2ML IJ SOLN
INTRAMUSCULAR | Status: DC | PRN
  Administered 2019-04-24: 4 mg via INTRAVENOUS

## 2019-04-24 MED ORDER — LACTATED RINGERS IV SOLN
INTRAVENOUS | Status: DC | PRN
  Administered 2019-04-24: 08:00:00 via INTRAVENOUS

## 2019-04-24 MED ORDER — MORPHINE SULFATE (PF) 4 MG/ML IV SOLN: 2.0000 mg | INTRAVENOUS | Status: DC | PRN

## 2019-04-24 MED ORDER — SIMVASTATIN 20 MG PO TABS
20.0000 mg | ORAL_TABLET | Freq: Every day | ORAL | Status: DC
  Filled 2019-04-24: qty 1

## 2019-04-24 MED ORDER — OXYCODONE-ACETAMINOPHEN 5-325 MG PO TABS: 1.0000 | ORAL_TABLET | ORAL | 0 refills | Status: DC | PRN

## 2019-04-24 MED ORDER — SODIUM CHLORIDE 0.9 % IV SOLN
INTRAVENOUS | Status: DC
  Administered 2019-04-24 – 2019-04-25 (×2): via INTRAVENOUS

## 2019-04-24 MED ORDER — PHENYLEPHRINE HCL (PRESSORS) 10 MG/ML IV SOLN
INTRAVENOUS | Status: AC
  Filled 2019-04-24: qty 1

## 2019-04-24 MED ORDER — SODIUM CHLORIDE FLUSH 0.9 % IV SOLN
INTRAVENOUS | Status: AC
  Filled 2019-04-24: qty 10

## 2019-04-24 MED ORDER — ONDANSETRON HCL 4 MG/2ML IJ SOLN: 4.0000 mg | INTRAMUSCULAR | Status: DC | PRN

## 2019-04-24 MED ORDER — CEFAZOLIN SODIUM-DEXTROSE 2-4 GM/100ML-% IV SOLN
2.0000 g | INTRAVENOUS | Status: AC
  Administered 2019-04-24: 2 g via INTRAVENOUS

## 2019-04-24 MED ORDER — LIDOCAINE HCL 4 % MT SOLN
OROMUCOSAL | Status: DC | PRN
  Administered 2019-04-24: 3 mL via TOPICAL

## 2019-04-24 MED ORDER — SUGAMMADEX SODIUM 200 MG/2ML IV SOLN
INTRAVENOUS | Status: AC
  Filled 2019-04-24: qty 2

## 2019-04-24 MED ORDER — BUPIVACAINE HCL 0.5 % IJ SOLN
INTRAMUSCULAR | Status: DC | PRN
  Administered 2019-04-24: 30 mL

## 2019-04-24 MED ORDER — DIPHENHYDRAMINE HCL 50 MG/ML IJ SOLN: 12.5000 mg | Freq: Four times a day (QID) | INTRAMUSCULAR | Status: DC | PRN

## 2019-04-24 MED ORDER — DIPHENHYDRAMINE HCL 12.5 MG/5ML PO ELIX
12.5000 mg | ORAL_SOLUTION | Freq: Four times a day (QID) | ORAL | Status: DC | PRN
  Filled 2019-04-24: qty 5

## 2019-04-24 SURGICAL SUPPLY — 102 items
ANCHOR TIS RET SYS 235ML (MISCELLANEOUS) ×4 IMPLANT
APPLICATOR SURGIFLO ENDO (HEMOSTASIS) ×4 IMPLANT
APPLIER CLIP LOGIC TI 5 (MISCELLANEOUS) IMPLANT
BAG URINE DRAIN 2000ML AR STRL (UROLOGICAL SUPPLIES) ×4 IMPLANT
BLADE CLIPPER SURG (BLADE) ×4 IMPLANT
BULB RESERV EVAC DRAIN JP 100C (MISCELLANEOUS) IMPLANT
CANISTER SUCT 1200ML W/VALVE (MISCELLANEOUS) ×4 IMPLANT
CATH DRAINAGE MALECOT 26FR (CATHETERS) ×2 IMPLANT
CATH FOL 2WAY LX 18X5 (CATHETERS) ×8 IMPLANT
CATH MALECOT (CATHETERS) ×4
CHLORAPREP W/TINT 26 (MISCELLANEOUS) ×8 IMPLANT
CLIP SUT LAPRA TY ABSORB (SUTURE) IMPLANT
CLIP VESOLOCK LG 6/CT PURPLE (CLIP) ×8 IMPLANT
CORD BIP STRL DISP 12FT (MISCELLANEOUS) ×4 IMPLANT
CORD MONOPOLAR M/FML 12FT (MISCELLANEOUS) ×4 IMPLANT
COVER TIP SHEARS 8 DVNC (MISCELLANEOUS) ×2 IMPLANT
COVER TIP SHEARS 8MM DA VINCI (MISCELLANEOUS) ×2
COVER WAND RF STERILE (DRAPES) ×4 IMPLANT
DEFOGGER SCOPE WARMER CLEARIFY (MISCELLANEOUS) ×4 IMPLANT
DERMABOND ADVANCED (GAUZE/BANDAGES/DRESSINGS) ×2
DERMABOND ADVANCED .7 DNX12 (GAUZE/BANDAGES/DRESSINGS) ×2 IMPLANT
DRAIN CHANNEL JP 15F RND 16 (MISCELLANEOUS) IMPLANT
DRAIN CHANNEL JP 19F (MISCELLANEOUS) IMPLANT
DRAPE 3/4 80X56 (DRAPES) ×4 IMPLANT
DRAPE COLUMN DVNC XI (DISPOSABLE) ×2 IMPLANT
DRAPE DA VINCI XI COLUMN (DISPOSABLE) ×2
DRAPE LEGGINS SURG 28X43 STRL (DRAPES) ×4 IMPLANT
DRAPE SURG 17X11 SM STRL (DRAPES) ×16 IMPLANT
DRAPE UNDER BUTTOCK W/FLU (DRAPES) ×4 IMPLANT
DRIVER LRG NEEDLE DA VINCI (INSTRUMENTS) ×4
DRIVER NDLE LRG DVNC (INSTRUMENTS) ×4 IMPLANT
DRSG TELFA 3X8 NADH (GAUZE/BANDAGES/DRESSINGS) ×4 IMPLANT
ELECT REM PT RETURN 9FT ADLT (ELECTROSURGICAL) ×4
ELECTRODE REM PT RTRN 9FT ADLT (ELECTROSURGICAL) ×2 IMPLANT
FORCEPS MARYLAND BIPOLAR 8X55 (INSTRUMENTS) ×2
FORCEPS MRYLND BPLR 8X55 DVNC (INSTRUMENTS) ×2 IMPLANT
GLOVE BIO SURGEON STRL SZ 6.5 (GLOVE) ×18 IMPLANT
GLOVE BIO SURGEONS STRL SZ 6.5 (GLOVE) ×6
GLOVE BIOGEL PI IND STRL 7.5 (GLOVE) ×2 IMPLANT
GLOVE BIOGEL PI INDICATOR 7.5 (GLOVE) ×2
GOWN STRL REUS W/ TWL LRG LVL3 (GOWN DISPOSABLE) ×8 IMPLANT
GOWN STRL REUS W/ TWL XL LVL3 (GOWN DISPOSABLE) ×8 IMPLANT
GOWN STRL REUS W/TWL LRG LVL3 (GOWN DISPOSABLE) ×8
GOWN STRL REUS W/TWL XL LVL3 (GOWN DISPOSABLE) ×8
GRASPER SUT TROCAR 14GX15 (MISCELLANEOUS) ×4 IMPLANT
HEMOSTAT SURGICEL 2X14 (HEMOSTASIS) ×4 IMPLANT
HOLDER FOLEY CATH W/STRAP (MISCELLANEOUS) ×4 IMPLANT
IRRIGATION STRYKERFLOW (MISCELLANEOUS) ×2 IMPLANT
IRRIGATOR STRYKERFLOW (MISCELLANEOUS) ×4
IV LACTATED RINGERS 1000ML (IV SOLUTION) ×4 IMPLANT
KIT PINK PAD W/HEAD ARE REST (MISCELLANEOUS) ×4
KIT PINK PAD W/HEAD ARM REST (MISCELLANEOUS) ×2 IMPLANT
LABEL OR SOLS (LABEL) ×4 IMPLANT
MARKER SKIN DUAL TIP RULER LAB (MISCELLANEOUS) ×4 IMPLANT
NEEDLE HYPO 22GX1.5 SAFETY (NEEDLE) ×4 IMPLANT
NEEDLE INSUFFLATION 14GA 120MM (NEEDLE) ×4 IMPLANT
NS IRRIG 500ML POUR BTL (IV SOLUTION) ×4 IMPLANT
OBTURATOR OPTICAL STANDARD 8MM (TROCAR) ×2
OBTURATOR OPTICAL STND 8 DVNC (TROCAR) ×2
OBTURATOR OPTICALSTD 8 DVNC (TROCAR) ×2 IMPLANT
PACK LAP CHOLECYSTECTOMY (MISCELLANEOUS) ×4 IMPLANT
PENCIL ELECTRO HAND CTR (MISCELLANEOUS) ×4 IMPLANT
PROGRASP ENDOWRIST DA VINCI (INSTRUMENTS) ×2
PROGRASP ENDOWRIST DVNC (INSTRUMENTS) ×2 IMPLANT
RELOAD STAPLER WHITE 60MM (STAPLE) ×4 IMPLANT
SEAL CANN UNIV 5-8 DVNC XI (MISCELLANEOUS) ×8 IMPLANT
SEAL XI 5MM-8MM UNIVERSAL (MISCELLANEOUS) ×8
SET TUBE SMOKE EVAC HIGH FLOW (TUBING) ×4 IMPLANT
SLEEVE ENDOPATH XCEL 5M (ENDOMECHANICALS) ×8 IMPLANT
SOLUTION ELECTROLUBE (MISCELLANEOUS) ×4 IMPLANT
SPOGE SURGIFLO 8M (HEMOSTASIS) ×2
SPONGE LAP 4X18 RFD (DISPOSABLE) ×8 IMPLANT
SPONGE SURGIFLO 8M (HEMOSTASIS) ×2 IMPLANT
SPONGE VERSALON 4X4 4PLY (MISCELLANEOUS) IMPLANT
STAPLE ECHEON FLEX 60 POW ENDO (STAPLE) ×4 IMPLANT
STAPLER ECHELON LONG 60 440 (INSTRUMENTS) ×4 IMPLANT
STAPLER RELOAD WHITE 60MM (STAPLE) ×8
STAPLER SKIN PROX 35W (STAPLE) ×4 IMPLANT
STRAP SAFETY 5IN WIDE (MISCELLANEOUS) ×4 IMPLANT
SURGILUBE 2OZ TUBE FLIPTOP (MISCELLANEOUS) ×4 IMPLANT
SUT DVC VLOC 90 3-0 CV23 UNDY (SUTURE) ×4 IMPLANT
SUT DVC VLOC 90 3-0 CV23 VLT (SUTURE) ×4
SUT ETHILON 3-0 FS-10 30 BLK (SUTURE)
SUT MNCRL 4-0 (SUTURE) ×4
SUT MNCRL 4-0 27XMFL (SUTURE) ×4
SUT PROLENE 5 0 RB 1 DA (SUTURE) IMPLANT
SUT SILK 2 0 SH (SUTURE) ×4 IMPLANT
SUT VIC AB 0 CT1 36 (SUTURE) ×8 IMPLANT
SUT VIC AB 2-0 CT1 (SUTURE) ×8 IMPLANT
SUT VIC AB 2-0 SH 27 (SUTURE) ×4
SUT VIC AB 2-0 SH 27XBRD (SUTURE) ×4 IMPLANT
SUT VICRYL 0 AB UR-6 (SUTURE) ×8 IMPLANT
SUTURE DVC VLC 90 3-0 CV23 VLT (SUTURE) ×2 IMPLANT
SUTURE EHLN 3-0 FS-10 30 BLK (SUTURE) IMPLANT
SUTURE MNCRL 4-0 27XMF (SUTURE) ×4 IMPLANT
SYR 10ML LL (SYRINGE) ×4 IMPLANT
SYR BULB IRRIG 60ML STRL (SYRINGE) ×4 IMPLANT
SYRINGE IRR TOOMEY STRL 70CC (SYRINGE) ×8 IMPLANT
TAPE CLOTH 3X10 WHT NS LF (GAUZE/BANDAGES/DRESSINGS) ×8 IMPLANT
TROCAR ENDOPATH XCEL 12X100 BL (ENDOMECHANICALS) ×8 IMPLANT
TROCAR XCEL 12X100 BLDLESS (ENDOMECHANICALS) ×4 IMPLANT
TROCAR XCEL NON-BLD 5MMX100MML (ENDOMECHANICALS) ×4 IMPLANT

## 2019-04-24 NOTE — Anesthesia Procedure Notes (Signed)
Procedure Name: Intubation Performed by: , , CRNA Pre-anesthesia Checklist: Patient identified, Patient being monitored, Timeout performed, Emergency Drugs available and Suction available Patient Re-evaluated:Patient Re-evaluated prior to induction Oxygen Delivery Method: Circle system utilized Preoxygenation: Pre-oxygenation with 100% oxygen Induction Type: IV induction Ventilation: Mask ventilation without difficulty Laryngoscope Size: McGraph and 4 Grade View: Grade I Tube type: Oral Tube size: 8.0 mm Number of attempts: 1 Airway Equipment and Method: Stylet,  Video-laryngoscopy and LTA kit utilized Placement Confirmation: ETT inserted through vocal cords under direct vision,  positive ETCO2 and breath sounds checked- equal and bilateral Secured at: 23 cm Tube secured with: Tape Dental Injury: Teeth and Oropharynx as per pre-operative assessment        

## 2019-04-24 NOTE — H&P (Signed)
03/11/2019  --> updated 04/24/2019  Patient has elected to proceed with robotic prostatectomy.  Stratification indicates 2% risk of lymph node involvement thus will go ahead and proceed with bilateral pelvic lymph node dissection.  Based on the extent of disease primary on the left, will do nonnerve sparing versus partial nerve sparing on the right.  RRR CTAB  Potassium low this a.m., 2.9 receiving supplementation and appropriate for anesthesia further evaluation.   Philip Arnold 09-18-1960 [id number]  Referring provider: [name], MD [place],  [place] [address]      Chief Complaint  Patient presents with  . Prostate Cancer    34mo w/PSA&MRI    HPI: 58 year old male who returns to the office for active surveillance of his low volume intermediate risk prostate cancer.  He underwent Prostate biopsy on 08/19/2018 which was uncomplicated. Biopsy results revealed 1 single core of Gleason 3+4 involving 9% of the tissue at the right lateral apex. There is also an atypical focus of suspicious appearing cells at the left apex but not diagnostic of prostate cancer. Remainder of the prostate was benign.  TRUS volume 36.2 mL  In the interim, his PSA continues to rise.  It was around 7.0 at the time of biopsy and now is risen to 7.9.  He is also undergone interval prostate MRI on 03/06/2019.  This reveals 1.6 cm PI-RADS 5 lesion in the left anterior peripheral zone extending into the apex.  There is contact with the capsule but no obvious trans-capsular extension.  Normal lymph nodes.  He has minimal urinary symptoms.  No baseline erectile dysfunction.  No previous abdominal surgeries.   PMH:     Past Medical History:  Diagnosis Date  . Elevated PSA   . GERD (gastroesophageal reflux disease)   . Hip pain   . Hyperglycemia   . Hypertension   . Obesity     Surgical History: No past surgical history on file.  Home  Medications:  Allergies as of 03/11/2019   No Known Allergies        Medication List       Accurate as of March 11, 2019 10:38 AM. If you have any questions, ask your nurse or doctor.        amLODipine 10 MG tablet Commonly known as: NORVASC take 1 tablet by mouth once daily   aspirin EC 81 MG tablet Take by mouth.   metoprolol succinate 100 MG 24 hr tablet Commonly known as: TOPROL-XL take 1 tablet by mouth once daily   potassium chloride 10 MEQ tablet Commonly known as: K-DUR Take 10 mEq by mouth every other day.   PriLOSEC OTC 20 MG tablet Generic drug: omeprazole Take by mouth.   simvastatin 20 MG tablet Commonly known as: ZOCOR take 1 tablet by mouth at bedtime   tamsulosin 0.4 MG Caps capsule Commonly known as: FLOMAX TK ONE C PO  QD 30 MINUTES AFTER THE SAME MEAL EACH DAY   triamterene-hydrochlorothiazide 37.5-25 MG capsule Commonly known as: DYAZIDE take 1 capsule by mouth once daily   Vitamin D3 50 MCG (2000 UT) capsule Take by mouth.       Allergies: No Known Allergies  Family History:      Family History  Problem Relation Age of Onset  . Bone cancer Mother   . Hypertension Father   . Stroke Father     Social History:  reports that he has never smoked. He has never used smokeless tobacco.  He reports current alcohol use. He reports that he does not use drugs.  ROS: UROLOGY Frequent Urination?: No Hard to postpone urination?: No Burning/pain with urination?: No Get up at night to urinate?: No Leakage of urine?: No Urine stream starts and stops?: No Trouble starting stream?: No Do you have to strain to urinate?: No Blood in urine?: No Urinary tract infection?: No Sexually transmitted disease?: No Injury to kidneys or bladder?: No Painful intercourse?: No Weak stream?: No Erection problems?: No Penile pain?: No  Gastrointestinal Nausea?: No Vomiting?: No Indigestion/heartburn?: No Diarrhea?: No  Constipation?: No  Constitutional Fever: No Night sweats?: No Weight loss?: No Fatigue?: No  Skin Skin rash/lesions?: Yes Itching?: No  Eyes Blurred vision?: No Double vision?: No  Ears/Nose/Throat Sore throat?: No Sinus problems?: No  Hematologic/Lymphatic Swollen glands?: No Easy bruising?: No  Cardiovascular Leg swelling?: No Chest pain?: No  Respiratory Cough?: No Shortness of breath?: No  Endocrine Excessive thirst?: No  Musculoskeletal Back pain?: No Joint pain?: No  Neurological Headaches?: No Dizziness?: No  Psychologic Depression?: No Anxiety?: No  Physical Exam: BP (!) 164/85   Pulse 60   Ht 6' (1.829 m)   Wt 261 lb (118.4 kg)   BMI 35.40 kg/m   Constitutional:  Alert and oriented, No acute distress. HEENT: [place] AT, moist mucus membranes.  Trachea midline, no masses. Cardiovascular: No clubbing, cyanosis, or edema. Respiratory: Normal respiratory effort, no increased work of breathing. GI: Abdomen is soft, nontender, nondistended, no abdominal masses, obese with pannus, no umbilical hernias appreciated.  No abdominal scars. Skin: No rashes, bruises or suspicious lesions. Neurologic: Grossly intact, no focal deficits, moving all 4 extremities. Psychiatric: Normal mood and affect.  Laboratory Data: Recent Labs       Lab Results  Component Value Date   CREATININE 1.20 03/06/2019      Pertinent Imaging: CLINICAL DATA: Recent elevation of PSA level at 7.9. Gleason 3+4=7 prostate adenocarcinoma in 9% of a core at the left apex on 08/19/2018.  EXAM: MR PROSTATE WITHOUT AND WITH CONTRAST  TECHNIQUE: Multiplanar multisequence MRI images were obtained of the pelvis centered about the prostate. Pre and post contrast images were obtained.  CONTRAST: 63mL GADAVIST GADOBUTROL 1 MMOL/ML IV SOLN  COMPARISON: None.  FINDINGS: Prostate:  Region of interest # 1: PI-RADS category 5 lesion of the left anterior  peripheral zone of the mid gland extending into the apex with low ADC map activity, very high diffusion weighted restriction, and early enhancement. This lesion measures 1.7 by 1.1 by 1.4 cm (0.92 cubic cm).  Otherwise there is hazy and nonfocal T2 stranding throughout the peripheral zone of the prostate gland bilaterally with generalized enhancement.  Volume: 3D volumetric analysis: The prostate measures 4.1 by 3.6 by 4.1 cm (29.28 cubic cm).  Transcapsular spread: Not directly visualized, but there is 1.6 cm of contact of the lesion designated in region of interest # 1 with the capsular surface, which does increase risk of occult transcapsular spread.  Seminal vesicle involvement: Absent  Neurovascular bundle involvement: Absent  Pelvic adenopathy: Absent  Bone metastasis: Absent  Other findings: Mild sigmoid colon diverticulosis.  IMPRESSION: 1. PI-RADS category 5 lesion of the left anterior peripheral zone in mid gland extending into the apex. Appropriate targeting data sent to [place]. This lesion has 1.6 cm with contact with the capsule, and although trans capsular spread is not directly identified, this magnitude of contact with the capsule does increase risk of occult trans capsular spread. 2. Mild sigmoid colon  diverticulosis.   Electronically Signed By: Van Clines M.D. On: 03/06/2019 10:07  MRI images were personally reviewed today.  Agree with radiology interpretation.  Assessment & Plan:    1. Prostate cancer [facility]) 58 year old male with intermediate risk prostate cancer, 3+4 low-volume involving 1 core, 9% the tissue.  MRI confirms presence of tumor at the left base but majority of the tumor is in the left anterior peripheral zone, likely under sampled at the time of initial biopsy given the location of the tumor.  Additionally, the tumor is radiographically more extensive than biopsy suggest.  There is evidence that it abuts the  capsule extensively but without evidence of radiographic extracapsular extension.  No lymphadenopathy which is reassuring.  We discussed today based on these findings as well as his continued rising PSA, I do not feel that he is any longer a good candidate for active surveillance.  I more strongly recommended definitive management for localized prostate cancer in the form of prostatectomy versus radiation with ADT x6 months.  We reviewed the risks and benefits of each of these today extensively.  He does appear to be a fairly good surgical candidate, he has significant obesity but is otherwise young without previous abdominal surgery.  His obesity does make him high risk for stress incontinence and would recommend preoperative weight loss if possible.  He was offered a referral to radiation oncology discussed these options were essentially declined again today.  He is leaning toward surgery.  He would like his wife to be involved in this discussion.  She is at work right now.  He would like to schedule a virtual visit to continue this discussion with his wife present, will arrange for virtual visit next week.  Hollice Espy, MD  [facility] [address], [place], [place] [address] [phone]  I spent 25 min with this patient of which greater than 50% was spent in counseling and coordination of care with the patient.

## 2019-04-24 NOTE — Op Note (Signed)
04/24/19  PREOPERATIVE DIAGNOSIS: Prostate cancer.  POSTOPERATIVE DIAGNOSIS: Prostate cancer.  OPERATION PERFORMED: 1. DaVinci laparoscopic radical prostatectomy (partial nerve stammering on the left, full nerve sparing on the right 2 DaVinci laproscopic bilateral pelvic lymph node dissection.  SURGEON: Hollice Espy, MD  ASSISTANTS: Nickolas Madrid, MD  ANESTHESIA: General.  EBL: 300 cc  SPECIMEN: Prostate with bilateral seminal vesicals, bilateral pelvic lymph nodes, anterior fat pad.  FINDINGS: Accessory Pudendal Vessel: none  INDICATION: Pt.is a 58 year old male with Gleason 3+4 prostate cancer. Treatment options were discussed with him at length and he chose DaVinci radical prostatectomy.  Excellent baseline erections therefore plan for at least partial nerve sparing was discussed. Bilateral pelvic lymph node dissection was planned due to his risk stratification.  PROCEDURE IN DETAIL: Patient was given Ancef preoperatively. He had sequential compression devices applied preoperatively for DVT prophylaxis. He was taken to the operating room where he was induced with general anesthesia. After adequate anesthesia, he was placed in the dorsal lithotomy position. His arms were draped by his side and was appropriately padded and secured to the operating room table. He was placed in the Trendelenburg position.  He was prepped and draped in sterile fashion. An 61 French Foley was placed in the bladder and instilled with 15 cc sterile water. Orogastric tube was placed. The Veress needle was passed just above the umbilicus and the abdomen was insufflated to 15 atmospheres. An 8 mm, blunt-tip trocar was placed just above the umbilicus. The zero-degree camera was passed within this and the following trocars were placed under direct vision; 8 mm robotic trocars were placed 9 cm laterally and inferiorly to the initially placed umbilical trocar. A third one was placed 7 cm  lateral to the left-sided trocar. In the corresponding position on the right side, a 12 mm trocar was placed, and then a 5 mm trocar was placed to the right and well above the umbilicus.  The robot was then docked with the robot trocar. I used the zero-degree camera. I had the hot scissors in the right hand and the left hand with the [place] bipolar and far left hand the Prograsp forceps. Initially I divided the median umbilical ligament bilaterally and the urachus and developed the space of Retzius down to pubic bone. I divided the parietal peritoneum laterally up to the vas deferens on each side. I used the Cardier forceps to provide cranial traction on the urachus. I cleaned off the Endopelvic fascia on each side and then divided it with the scissors laterally to the perirectal fat and medially to the puboprostatic ligaments which were divided. I then ligated the dorsal vein complex using a 60 mm vascular load stapler.   I then addressed the bladder neck with a 30-degree down lens. I identified the bladder neck by pulling on the Foley catheter. I divided the anterior bladder neck musculature until I then found the anterior bladder neck mucosa which was incised. I identified the Foley catheter within, deflated the balloon, pulled the Foley out through this opening and then using the Carter-Thomason needle with a #0-Vicryl suture, passed through The suprapubic region and pulled the suture through the eye of the Foley and then back out. This allowed me to provide upward traction on the prostate. I then divided the lateral bladder neck mucosa and the posterior bladder neck mucosa. I was well away from ureteral orifices. I divided the posterior bladder neck musculature until I identified the vas deferens. They were freed proximally, then divided. I freed up  the seminal vesicals using blunt and sharp dissection. Extremely judicious use of electrocautery was used near the seminal vesicle  tips to avoid injury to neurovascular bundle.   I then went back to the 0-degree lens. I divided the Denonvilliers fascia beneath the prostate and developed the prostate off the rectum. I then did a left sided partial nerve sparing by  creating a plane which was intrafascial.  On the right, there is a nice plane sweeping the neurovascular bundle away from the capsule of the prostate for full nerve sparing technique.  I then isolated the pedicles of the prostate and placed weck clips on the pedicles of the prostate and then divided it with cold scissors. I continued to divide the neurovascular bundles off the prostate out to the apex of the prostate.  Notably, the posterior plate was relatively stuck but ultimately successful including a nice plane just below the capsule.  At this point the prostate was freed up except for the urethra. I addressed the prostate anteriorly, divided the dorsal vein , then the anterior urethral wall, pulled the Foley catheter back and then divided the posterior urethral wall. Specimen was completely freed up. I placed the prostate in an Endo catch bag and then placed the bag in the upper abdomen out of the way. I then irrigated the pelvis. The rectal test was negative. There was reasonable hemostasis.  I then did the pelvic lymph node dissection by incising the fascia overlying the right external iliac vein, dissecting distally. I went just distal to the node of [place]et where we placed clips and then divided the lymphatics. The lateral aspect of the dissection was the pelvic side wall, inferior was the obturator nerve and proximal the hypogastric vessels. I placed clips at the proximal aspect and then divided the lymphatics. This was removed with the spoon grasper and sent to pathology.   I then did the left obturator lymph node dissection in the same fashion as the left side.  With good hemostasis, I then did the posterior reconstruction. I used a 3-0 VLoc  suture on an RB1 through the cut edge of Denonvilliers fascia beneath the bladder on the right side and through the posterior striated sphincter underneath the urethra. This brought the bladder neck and urethra and closer proximity to help facilitate anastomosis.   I then did the urethral vesicle anastomosis again with two 3-0 VLoc sutures on an RB1 needle interlocked. I passed both ends of the suture from the outside-in through the bladder neck at the 6 o'clock position. I passed both through the urethral stump from the inside-out in the corresponding position. I reapproximated the bladder neck to the urethra. I then ran the Left suture on the left side anastomosis to the 9 o'clock position. Then I went back to the right sided suture and ran that up the right side to the 12 o'clock position. I then continued the left suture to the 12 o'clock position.   I then placed a new 72 French Foley into the bladder and filled it with 10 cc sterile water. I irrigated the bladder with 160 cc. There was no leakage. There was reasonable hemostasis.  Surgicel was used on either side of the pedicles for an additional hemostasis as well Surgi-Flo. The instruments were then removed. The robot was undocked and all the trocars were removed under direct vision. There was good hemostasis. I then enlarged the umbilical trocar site large enough to remove the prostate and I closed the fascia here with #  0-0 Vicryl suture in a running fashion. All the port sites were irrigated. Lidocaine was injected into all the trocar sites. The skin was closed with 4-0 Monocryl in running subcuticular fashion. Dermabond was applied.   At this point patient was awakened and extubated in the operating room and taken to the recovery room in stable condition. There were no complications. All counts correct.  Hollice Espy, MD

## 2019-04-24 NOTE — Anesthesia Post-op Follow-up Note (Signed)
Anesthesia QCDR form completed.        

## 2019-04-24 NOTE — Anesthesia Preprocedure Evaluation (Signed)
Anesthesia Evaluation  Patient identified by MRN, date of birth, ID band Patient awake    Reviewed: Allergy & Precautions, NPO status , Patient's Chart, lab work & pertinent test results  Airway Mallampati: II  TM Distance: >3 FB     Dental   Pulmonary neg pulmonary ROS,    Pulmonary exam normal        Cardiovascular hypertension, Normal cardiovascular exam     Neuro/Psych negative neurological ROS  negative psych ROS   GI/Hepatic Neg liver ROS, GERD  ,  Endo/Other  negative endocrine ROS  Renal/GU negative Renal ROS  negative genitourinary   Musculoskeletal negative musculoskeletal ROS (+)   Abdominal Normal abdominal exam  (+)   Peds negative pediatric ROS (+)  Hematology negative hematology ROS (+)   Anesthesia Other Findings   Reproductive/Obstetrics                             Anesthesia Physical Anesthesia Plan  ASA: II  Anesthesia Plan: General   Post-op Pain Management:    Induction: Intravenous  PONV Risk Score and Plan:   Airway Management Planned: Oral ETT  Additional Equipment:   Intra-op Plan:   Post-operative Plan: Extubation in OR  Informed Consent: I have reviewed the patients History and Physical, chart, labs and discussed the procedure including the risks, benefits and alternatives for the proposed anesthesia with the patient or authorized representative who has indicated his/her understanding and acceptance.       Plan Discussed with: CRNA and Surgeon  Anesthesia Plan Comments:         Anesthesia Quick Evaluation

## 2019-04-24 NOTE — Discharge Instructions (Signed)
Activity:  You are encouraged to ambulate frequently (about every hour during waking hours) to help prevent blood clots from forming in your legs or lungs.  However, you should not engage in any heavy lifting (> 5-10 lbs), strenuous activity, or straining.   Diet: You should advance your diet as instructed by your physician.  It will be normal to have some bloating, nausea, and abdominal discomfort intermittently.   Prescriptions:  You will be provided a prescription for pain medication to take as needed.  If your pain is not severe enough to require the prescription pain medication, you may take extra strength Tylenol instead which will have less side effects.  You should also take a prescribed stool softener to avoid straining with bowel movements as the prescription pain medication may constipate you.   Incisions: You may remove your dressing bandages 48 hours after surgery if not removed in the hospital.  You will either have some small staples or special tissue glue at each of the incision sites. Once the bandages are removed (if present), the incisions may stay open to air.  You may start showering (but not soaking or bathing in water) the 2nd day after surgery and the incisions simply need to be patted dry after the shower.  No additional care is needed.  What to call us about: You should call the office if you develop fever > 101 or develop persistent vomiting, redness or draining around your incision, or any other concerning symptoms.    [facility] [address], [place], [place] [address] [phone]    Indwelling Urinary Catheter Care, Adult An indwelling urinary catheter is a thin tube that is put into your bladder. The tube helps to drain pee (urine) out of your body. The tube goes in through your urethra. Your urethra is where pee comes out of your body. Your pee will come out through the catheter, then it will go into a bag (drainage  bag). Take good care of your catheter so it will work well. How to wear your catheter and bag Supplies needed  Sticky tape (adhesive tape) or a leg strap.  Alcohol wipe or soap and water (if you use tape).  A clean towel (if you use tape).  Large overnight bag.  Smaller bag (leg bag). Wearing your catheter Attach your catheter to your leg with tape or a leg strap.  Make sure the catheter is not pulled tight.  If a leg strap gets wet, take it off and put on a dry strap.  If you use tape to hold the bag on your leg: 1. Use an alcohol wipe or soap and water to wash your skin where the tape made it sticky before. 2. Use a clean towel to pat-dry that skin. 3. Use new tape to make the bag stay on your leg. Wearing your bags You should have been given a large overnight bag.  You may wear the overnight bag in the day or night.  Always have the overnight bag lower than your bladder.  Do not let the bag touch the floor.  Before you go to sleep, put a clean plastic bag in a wastebasket. Then hang the overnight bag inside the wastebasket. You should also have a smaller leg bag that fits under your clothes.  Always wear the leg bag below your knee.  Do not wear your leg bag at night. How to care for your skin and catheter Supplies needed  A clean  washcloth.  Water and mild soap.  A clean towel. Caring for your skin and catheter      Clean the skin around your catheter every day: ? Wash your hands with soap and water. ? Wet a clean washcloth in warm water and mild soap. ? Clean the skin around your urethra. ? If you are male: ? Gently spread the folds of skin around your vagina (labia). ? With the washcloth in your other hand, wipe the inner side of your labia on each side. Wipe from front to back. ? If you are male: ? Pull back any skin that covers the end of your penis (foreskin). ? With the washcloth in your other hand, wipe your penis in small circles. Start wiping  at the tip of your penis, then move away from the catheter. ? Move the foreskin back in place, if needed. ? With your free hand, hold the catheter close to where it goes into your body. ? Keep holding the catheter during cleaning so it does not get pulled out. ? With the washcloth in your other hand, clean the catheter. ? Only wipe downward on the catheter. ? Do not wipe upward toward your body. Doing this may push germs into your urethra and cause infection. ? Use a clean towel to pat-dry the catheter and the skin around it. Make sure to wipe off all soap. ? Wash your hands with soap and water.  Shower every day. Do not take baths.  Do not use cream, ointment, or lotion on the area where the catheter goes into your body, unless your doctor tells you to.  Do not use powders, sprays, or lotions on your genital area.  Check your skin around the catheter every day for signs of infection. Check for: ? Redness, swelling, or pain. ? Fluid or blood. ? Warmth. ? Pus or a bad smell. How to empty the bag Supplies needed  Rubbing alcohol.  Gauze pad or cotton ball.  Tape or a leg strap. Emptying the bag Pour the pee out of your bag when it is ?- full, or at least 2-3 times a day. Do this for your overnight bag and your leg bag. 1. Wash your hands with soap and water. 2. Separate (detach) the bag from your leg. 3. Hold the bag over the toilet or a clean pail. Keep the bag lower than your hips and bladder. This is so the pee (urine) does not go back into the tube. 4. Open the pour spout. It is at the bottom of the bag. 5. Empty the pee into the toilet or pail. Do not let the pour spout touch any surface. 6. Put rubbing alcohol on a gauze pad or cotton ball. 7. Use the gauze pad or cotton ball to clean the pour spout. 8. Close the pour spout. 9. Attach the bag to your leg with tape or a leg strap. 10. Wash your hands with soap and water. Follow instructions for cleaning the drainage  bag:  From the product maker.  As told by your doctor. How to change the bag Supplies needed  Alcohol wipes.  A clean bag.  Tape or a leg strap. Changing the bag Replace your bag when it starts to leak, smell bad, or look dirty. 1. Wash your hands with soap and water. 2. Separate the dirty bag from your leg. 3. Pinch the catheter with your fingers so that pee does not spill out. 4. Separate the catheter tube from the bag tube  where these tubes connect (at the connection valve). Do not let the tubes touch any surface. 5. Clean the end of the catheter tube with an alcohol wipe. Use a different alcohol wipe to clean the end of the bag tube. 6. Connect the catheter tube to the tube of the clean bag. 7. Attach the clean bag to your leg with tape or a leg strap. Do not make the bag tight on your leg. 8. Wash your hands with soap and water. General rules   Never pull on your catheter. Never try to take it out. Doing that can hurt you.  Always wash your hands before and after you touch your catheter or bag. Use a mild, fragrance-free soap. If you do not have soap and water, use hand sanitizer.  Always make sure there are no twists or bends (kinks) in the catheter tube.  Always make sure there are no leaks in the catheter or bag.  Drink enough fluid to keep your pee pale yellow.  Do not take baths, swim, or use a hot tub.  If you are male, wipe from front to back after you poop (have a bowel movement). Contact a doctor if:  Your pee is cloudy.  Your pee smells worse than usual.  Your catheter gets clogged.  Your catheter leaks.  Your bladder feels full. Get help right away if:  You have redness, swelling, or pain where the catheter goes into your body.  You have fluid, blood, pus, or a bad smell coming from the area where the catheter goes into your body.  Your skin feels warm where the catheter goes into your body.  You have a fever.  You have pain in  your: ? Belly (abdomen). ? Legs. ? Lower back. ? Bladder.  You see blood in the catheter.  Your pee is pink or red.  You feel sick to your stomach (nauseous).  You throw up (vomit).  You have chills.  Your pee is not draining into the bag.  Your catheter gets pulled out. Summary  An indwelling urinary catheter is a thin tube that is placed into the bladder to help drain pee (urine) out of the body.  The catheter is placed into the part of the body that drains pee from the bladder (urethra).  Taking good care of your catheter will keep it working properly and help prevent problems.  Always wash your hands before and after touching your catheter or bag.  Never pull on your catheter or try to take it out. This information is not intended to replace advice given to you by your health care provider. Make sure you discuss any questions you have with your health care provider. Document Released: 09/29/2012 Document Revised: 09/26/2018 Document Reviewed: 01/18/2017 Elsevier Patient Education  2020 [employer].

## 2019-04-24 NOTE — Transfer of Care (Signed)
Immediate Anesthesia Transfer of Care Note  Patient: Philip Arnold.  Procedure(s) Performed: XI ROBOTIC ASSISTED LAPAROSCOPIC RADICAL PROSTATECTOMY (N/A ) PELVIC LYMPH NODE DISSECTION (Bilateral )  Patient Location: PACU  Anesthesia Type:General  Level of Consciousness: awake, alert , oriented and patient cooperative  Airway & Oxygen Therapy: Patient Spontanous Breathing and Patient connected to face mask oxygen  Post-op Assessment: Report given to RN and Post -op Vital signs reviewed and stable  Post vital signs: Reviewed and stable  Last Vitals:  Vitals Value Taken Time  BP 151/107 04/24/19 1205  Temp 37.7 C 04/24/19 1205  Pulse 70 04/24/19 1207  Resp 19 04/24/19 1207  SpO2 100 % 04/24/19 1207  Vitals shown include unvalidated device data.  Last Pain:  Vitals:   04/24/19 0613  TempSrc: Temporal  PainSc: 0-No pain         Complications: No apparent anesthesia complications

## 2019-04-24 NOTE — Anesthesia Postprocedure Evaluation (Signed)
Anesthesia Post Note  Patient: Philip Arnold.  Procedure(s) Performed: XI ROBOTIC ASSISTED LAPAROSCOPIC RADICAL PROSTATECTOMY (N/A ) PELVIC LYMPH NODE DISSECTION (Bilateral )  Patient location during evaluation: PACU Anesthesia Type: General Level of consciousness: awake and alert Pain management: pain level controlled Vital Signs Assessment: post-procedure vital signs reviewed and stable Respiratory status: spontaneous breathing and respiratory function stable Cardiovascular status: stable Anesthetic complications: no     Last Vitals:  Vitals:   04/24/19 0613 04/24/19 1205  BP: (!) 166/92 (!) 151/107  Pulse: 75 72  Resp: 17 12  Temp: 37.2 C 37.7 C  SpO2: 97% 100%    Last Pain:  Vitals:   04/24/19 0613  TempSrc: Temporal  PainSc: 0-No pain                 Lavanna Rog K

## 2019-04-25 ENCOUNTER — Encounter: Payer: Self-pay | Admitting: Urology

## 2019-04-25 DIAGNOSIS — C61 Malignant neoplasm of prostate: Secondary | ICD-10-CM | POA: Diagnosis not present

## 2019-04-25 LAB — CBC
HCT: 36.6 % — ABNORMAL LOW (ref 39.0–52.0)
Hemoglobin: 12.5 g/dL — ABNORMAL LOW (ref 13.0–17.0)
MCH: 28.3 pg (ref 26.0–34.0)
MCHC: 34.2 g/dL (ref 30.0–36.0)
MCV: 83 fL (ref 80.0–100.0)
Platelets: 296 10*3/uL (ref 150–400)
RBC: 4.41 MIL/uL (ref 4.22–5.81)
RDW: 15.7 % — ABNORMAL HIGH (ref 11.5–15.5)
WBC: 18 10*3/uL — ABNORMAL HIGH (ref 4.0–10.5)
nRBC: 0 % (ref 0.0–0.2)

## 2019-04-25 LAB — BASIC METABOLIC PANEL
Anion gap: 11 (ref 5–15)
BUN: 13 mg/dL (ref 6–20)
CO2: 26 mmol/L (ref 22–32)
Calcium: 8.9 mg/dL (ref 8.9–10.3)
Chloride: 102 mmol/L (ref 98–111)
Creatinine, Ser: 1.22 mg/dL (ref 0.61–1.24)
GFR calc Af Amer: >60 mL/min (ref >60)
GFR calc non Af Amer: >60 mL/min (ref >60)
Glucose, Bld: 124 mg/dL — ABNORMAL HIGH (ref 70–99)
Potassium: 3.1 mmol/L — ABNORMAL LOW (ref 3.5–5.1)
Sodium: 139 mmol/L (ref 135–145)

## 2019-04-25 MED ORDER — POTASSIUM CHLORIDE 20 MEQ PO PACK
40.0000 meq | PACK | Freq: Once | ORAL | Status: AC
  Administered 2019-04-25: 09:00:00 40 meq via ORAL
  Filled 2019-04-25: qty 2

## 2019-04-25 NOTE — Discharge Summary (Signed)
Physician Discharge Summary  Patient ID: Philip Arnold. MRN: [id number] DOB/AGE: 59/59/1962 59 y.o.  Admit date: 04/24/2019 Discharge date: 04/25/2019  Admission Diagnoses: Prostate cancer  Discharge Diagnoses:  Active Problems:   Prostate cancer [facility])   Discharged Condition: good  Hospital Course:  The patient underwent robotic prostatectomy and lymph node dissection for prostate cancer.  He tolerated the procedure well.  On postoperative day 1 he was ambulating, had excellent pain control, was tolerating a general diet without nausea vomiting, was passing gas, and was discharged to home.  Foley was clear yellow.  Discharge Exam: Blood pressure (!) 142/67, pulse 60, temperature 99 F (37.2 C), temperature source Oral, resp. rate 20, height 6' (1.829 m), weight 120 kg, SpO2 94 %.  Alert, no acute distress Abdomen soft, appropriately tender, incisions clean dry and intact, nondistended Foley with clear yellow urine  Disposition: Discharge disposition: 01-Home or Self Care       Discharge Instructions    Discharge instructions   Complete by: As directed    You underwent robotic prostate removal.  Walk daily to prevent blood clots in the legs.  No strenuous activity or lifting more than 10 to 15 pounds for 3 to 4 weeks.  Eat small healthy meals and drink plenty of fluids.  Call the clinic or present to the ED if you have fever over 101.3, severe abdominal pain, nausea, vomiting, chest pain, or shortness of breath.  It is normal for the urine in the catheter to be clear to light pink and even light red.  If there are large clots in the urine, please call the clinic.     Allergies as of 04/25/2019   No Known Allergies     Medication List    TAKE these medications   amLODipine 10 MG tablet Commonly known as: NORVASC Take 10 mg by mouth daily.   aspirin EC 81 MG tablet Take 81 mg by mouth daily.   docusate sodium 100 MG capsule Commonly known as: COLACE Take 1  capsule (100 mg total) by mouth 2 (two) times daily.   fluticasone 50 MCG/ACT nasal spray Commonly known as: FLONASE Place 1 spray into both nostrils daily as needed for allergies or rhinitis.   hydroxypropyl methylcellulose / hypromellose 2.5 % ophthalmic solution Commonly known as: ISOPTO TEARS / GONIOVISC Place 1 drop into both eyes 3 (three) times daily as needed for dry eyes.   metoprolol succinate 100 MG 24 hr tablet Commonly known as: TOPROL-XL Take 100 mg by mouth daily.   multivitamin with minerals Tabs tablet Take 1 tablet by mouth daily.   oxybutynin 5 MG tablet Commonly known as: DITROPAN Take 1 tablet (5 mg total) by mouth every 8 (eight) hours as needed for bladder spasms.   oxyCODONE-acetaminophen 5-325 MG tablet Commonly known as: Percocet Take 1-2 tablets by mouth every 4 (four) hours as needed for moderate pain or severe pain.   potassium chloride 10 MEQ tablet Commonly known as: KLOR-CON Take 10 mEq by mouth daily.   PriLOSEC OTC 20 MG tablet Generic drug: omeprazole Take 20 mg by mouth daily.   simvastatin 20 MG tablet Commonly known as: ZOCOR Take 20 mg by mouth at bedtime.   triamterene-hydrochlorothiazide 37.5-25 MG tablet Commonly known as: MAXZIDE-25 Take 1 tablet by mouth daily.   Vitamin D3 125 MCG (5000 UT) Caps Take 5,000 Units by mouth daily.      Follow-up Information    Hollice Espy, MD In 1 week.   Specialty:  Urology Why: foley removal Contact information: [address] [place] [id number] [phone]           Signed: Billey Co 04/25/2019, 9:08 AM

## 2019-04-29 ENCOUNTER — Telehealth: Payer: Self-pay | Admitting: Urology

## 2019-04-29 ENCOUNTER — Other Ambulatory Visit: Payer: Self-pay | Admitting: Pathology

## 2019-04-29 LAB — SURGICAL PATHOLOGY

## 2019-04-29 NOTE — Telephone Encounter (Signed)
Called patient to review surgical pathology.  More extensive disease than biopsy suggested, Gleason 3+5 bilaterally with extracapsular extension, negative margins, positive bladder neck, negative lymph nodes or seminal vesicles.  All questions answered.  We will follow this expectantly.  Hollice Espy, MD

## 2019-04-30 ENCOUNTER — Other Ambulatory Visit: Payer: Self-pay

## 2019-04-30 ENCOUNTER — Encounter: Payer: Self-pay | Admitting: Physician Assistant

## 2019-04-30 ENCOUNTER — Ambulatory Visit (INDEPENDENT_AMBULATORY_CARE_PROVIDER_SITE_OTHER): Payer: BC Managed Care – PPO | Admitting: Physician Assistant

## 2019-04-30 VITALS — BP 127/70 | HR 58 | Ht 72.0 in | Wt 261.0 lb

## 2019-04-30 DIAGNOSIS — T83038A Leakage of other indwelling urethral catheter, initial encounter: Secondary | ICD-10-CM

## 2019-04-30 LAB — BLADDER SCAN AMB NON-IMAGING

## 2019-04-30 NOTE — Progress Notes (Signed)
04/30/2019 1:47 PM   Philip Arnold. Jun 30, 1960 [id number]  CC: Leaking catheter  HPI: Philip Arnold. is a 58 y.o. male who presents today for evaluation of leaking Foley catheter. He is an established [facility] patient who underwent robotic radical prostatectomy with bilateral pelvic lymph node dissection with Drs. Erlene Quan and Hazard 6 days ago.  He is scheduled for voiding trial with me in clinic tomorrow.  He reports several incidents of urinary leakage around his Foley catheter.  He does believe the catheter is still draining.  He also noted a small clot in his leg bag several days ago after a walk.  He took an oxybutynin this morning and has noticed minimal leaking since.  PVR 91mL.  PMH: Past Medical History:  Diagnosis Date  . Cancer [facility])    Prostate Cancer  . Elevated PSA   . GERD (gastroesophageal reflux disease)   . Hip pain   . History of kidney stones   . Hyperglycemia   . Hypertension   . Obesity     Surgical History: Past Surgical History:  Procedure Laterality Date  . COLONOSCOPY WITH PROPOFOL    . PELVIC LYMPH NODE DISSECTION Bilateral 04/24/2019   Procedure: PELVIC LYMPH NODE DISSECTION;  Surgeon: Hollice Espy, MD;  Location: [facility] ORS;  Service: Urology;  Laterality: Bilateral;  . ROBOT ASSISTED LAPAROSCOPIC RADICAL PROSTATECTOMY N/A 04/24/2019   Procedure: XI ROBOTIC ASSISTED LAPAROSCOPIC RADICAL PROSTATECTOMY;  Surgeon: Hollice Espy, MD;  Location: [facility] ORS;  Service: Urology;  Laterality: N/A;    Home Medications:  Allergies as of 04/30/2019   No Known Allergies     Medication List       Accurate as of April 30, 2019  1:47 PM. If you have any questions, ask your nurse or doctor.        amLODipine 10 MG tablet Commonly known as: NORVASC Take 10 mg by mouth daily.   aspirin EC 81 MG tablet Take 81 mg by mouth daily.   docusate sodium 100 MG capsule Commonly known as: COLACE Take 1 capsule (100 mg total) by mouth 2 (two)  times daily.   fluticasone 50 MCG/ACT nasal spray Commonly known as: FLONASE Place 1 spray into both nostrils daily as needed for allergies or rhinitis.   hydroxypropyl methylcellulose / hypromellose 2.5 % ophthalmic solution Commonly known as: ISOPTO TEARS / GONIOVISC Place 1 drop into both eyes 3 (three) times daily as needed for dry eyes.   metoprolol succinate 100 MG 24 hr tablet Commonly known as: TOPROL-XL Take 100 mg by mouth daily.   multivitamin with minerals Tabs tablet Take 1 tablet by mouth daily.   oxybutynin 5 MG tablet Commonly known as: DITROPAN Take 1 tablet (5 mg total) by mouth every 8 (eight) hours as needed for bladder spasms.   oxyCODONE-acetaminophen 5-325 MG tablet Commonly known as: Percocet Take 1-2 tablets by mouth every 4 (four) hours as needed for moderate pain or severe pain.   potassium chloride 10 MEQ tablet Commonly known as: KLOR-CON Take 10 mEq by mouth daily.   PriLOSEC OTC 20 MG tablet Generic drug: omeprazole Take 20 mg by mouth daily.   simvastatin 20 MG tablet Commonly known as: ZOCOR Take 20 mg by mouth at bedtime.   triamterene-hydrochlorothiazide 37.5-25 MG tablet Commonly known as: MAXZIDE-25 Take 1 tablet by mouth daily.   Vitamin D3 125 MCG (5000 UT) Caps Take 5,000 Units by mouth daily.       Allergies:  No Known Allergies  Family History: Family History  Problem Relation Age of Onset  . Bone cancer Mother   . Hypertension Father   . Stroke Father     Social History:   reports that he has never smoked. He has never used smokeless tobacco. He reports current alcohol use. He reports that he does not use drugs.  ROS: UROLOGY Frequent Urination?: No Hard to postpone urination?: No Burning/pain with urination?: No Get up at night to urinate?: No Leakage of urine?: No Urine stream starts and stops?: No Trouble starting stream?: No Do you have to strain to urinate?: No Blood in urine?: No Urinary tract  infection?: No Sexually transmitted disease?: No Injury to kidneys or bladder?: No Painful intercourse?: No Weak stream?: No Erection problems?: No Penile pain?: No  Gastrointestinal Nausea?: No Vomiting?: No Indigestion/heartburn?: No Constipation?: No  Constitutional Fever: No Night sweats?: No Weight loss?: No Fatigue?: No  Skin Skin rash/lesions?: No Itching?: No  Eyes Blurred vision?: No Double vision?: No  Ears/Nose/Throat Sore throat?: No Sinus problems?: No  Hematologic/Lymphatic Swollen glands?: No Easy bruising?: No  Cardiovascular Leg swelling?: No Chest pain?: No  Respiratory Cough?: No Shortness of breath?: No  Endocrine Excessive thirst?: No  Musculoskeletal Back pain?: No Joint pain?: No  Neurological Headaches?: No Dizziness?: No  Psychologic Depression?: No Anxiety?: No  Physical Exam: BP 127/70   Pulse (!) 58   Ht 6' (1.829 m)   Wt 261 lb (118.4 kg)   BMI 35.40 kg/m   Constitutional:  Alert and oriented, no acute distress, nontoxic appearing HEENT: [place], AT Cardiovascular: No clubbing, cyanosis, or edema Respiratory: Normal respiratory effort, no increased work of breathing GI: Scattered surgical incisions noted of the anterior abdomen, all clean, dry, and intact  GU: Foley catheter in place draining clear yellow urine into leg bag.  No catheter leakage evident. Skin: No rashes, bruises or suspicious lesions Neurologic: Grossly intact, no focal deficits, moving all 4 extremities Psychiatric: Normal mood and affect  Laboratory Data: Results for orders placed or performed in visit on 04/30/19  Bladder Scan (Post Void Residual) in office  Result Value Ref Range   Scan Result 32mL    Assessment & Plan:   1. Leakage from urinary catheter, initial encounter [employer]) 58 year old male POD 6 from robotic radical prostatectomy with lymph node dissection here today with urinary leakage around his Foley catheter.  Catheter is still  draining, with PVR WNL.  I explained urinary drainage around the catheter is likely associated with bladder spasm.  I encouraged him to take oxybutynin as needed for management of this.  Per request by Dr. Erlene Quan, I deferred removing his catheter today and will proceed with voiding trial in clinic tomorrow as scheduled.  Additionally, I explained that blood in the urine and/or small clots are normal and to be expected following surgery, especially after exertion.  Patient expressed understanding. - Bladder Scan (Post Void Residual) in office   Return if symptoms worsen or fail to improve.  Debroah Loop, PA-C  [facility] [address] [place], [place] [address] [phone]

## 2019-05-01 ENCOUNTER — Encounter: Payer: Self-pay | Admitting: Physician Assistant

## 2019-05-01 ENCOUNTER — Ambulatory Visit: Payer: BC Managed Care – PPO | Admitting: Physician Assistant

## 2019-05-01 VITALS — BP 148/87 | HR 79 | Ht 72.0 in | Wt 261.0 lb

## 2019-05-01 DIAGNOSIS — C61 Malignant neoplasm of prostate: Secondary | ICD-10-CM

## 2019-05-01 LAB — BLADDER SCAN AMB NON-IMAGING: Scan Result: 24

## 2019-05-01 NOTE — Progress Notes (Signed)
Catheter Removal Patient is present today for a catheter removal.  8.63ml of water was drained from the balloon. An 18FR foley cath was removed from the bladder no complications were noted . Patient tolerated well. Performed by: Debroah Loop, PA-C  Follow up/ Additional notes: Return this afternoon for PVR. Counseled patient to push fluids today and record volume consumed. He expressed understanding.  Afternoon Follow-up Patient reports consuming 48oz water today and voided four times. He has had some urinary leakage without abdominal pain. PVR 61mL. No concerns for retention at this time. Results for orders placed or performed in visit on 05/01/19  Bladder Scan (Post Void Residual) in office  Result Value Ref Range   Scan Result 24

## 2019-05-04 ENCOUNTER — Telehealth: Payer: Self-pay | Admitting: Urology

## 2019-05-04 NOTE — Telephone Encounter (Signed)
Spoke with wife-verified referral was placed-they attempted to get reach patient-left message for them to return call to schedule appointment. Verbalized understanding-will call to schedule appointment.

## 2019-05-04 NOTE — Telephone Encounter (Signed)
Pt's wife called and states that he needs a new referral put in for Rehab therapy.

## 2019-05-07 ENCOUNTER — Other Ambulatory Visit: Payer: BC Managed Care – PPO

## 2019-05-07 NOTE — Progress Notes (Signed)
Tumor Board Documentation  Philip Arnold. was presented by Dr Clovis Riley and Erlene Quan at our Tumor Board on 05/07/2019, which included representatives from medical oncology, radiation oncology, pathology, radiology, surgical, navigation, internal medicine, genetics, pulmonology, palliative care, research.  Philip Arnold currently presents as an external consult, for Philip Arnold, for new positive pathology with history of the following treatments: surgical intervention(s).  Additionally, we reviewed previous medical and familial history, history of present illness, and recent lab results along with all available histopathologic and imaging studies. The tumor board considered available treatment options and made the following recommendations: Active surveillance    The following procedures/referrals were also placed: No orders of the defined types were placed in this encounter.   Clinical Trial Status: not discussed   Staging used: Pathologic Stage  AJCC Staging: T: 3a N: 0   Group: Prostate Cancer   National site-specific guidelines NCCN were discussed with respect to the case.  Tumor board is a meeting of clinicians from various specialty areas who evaluate and discuss patients for whom a multidisciplinary approach is being considered. Final determinations in the plan of care are those of the provider(s). The responsibility for follow up of recommendations given during tumor board is that of the provider.   Today's extended care, comprehensive team conference, Philip Arnold was not present for the discussion and was not examined.   Multidisciplinary Tumor Board is a multidisciplinary case peer review process.  Decisions discussed in the Multidisciplinary Tumor Board reflect the opinions of the specialists present at the conference without having examined the patient.  Ultimately, treatment and diagnostic decisions rest with the primary provider(s) and the patient.

## 2019-05-21 ENCOUNTER — Other Ambulatory Visit: Payer: Self-pay | Admitting: Family Medicine

## 2019-05-21 DIAGNOSIS — C61 Malignant neoplasm of prostate: Secondary | ICD-10-CM

## 2019-05-22 ENCOUNTER — Other Ambulatory Visit: Payer: Self-pay

## 2019-05-22 ENCOUNTER — Other Ambulatory Visit: Payer: BC Managed Care – PPO

## 2019-05-22 DIAGNOSIS — C61 Malignant neoplasm of prostate: Secondary | ICD-10-CM

## 2019-05-23 LAB — PSA: Prostate Specific Ag, Serum: <0.1 ng/mL (ref 0.0–4.0)

## 2019-05-25 ENCOUNTER — Telehealth: Payer: Self-pay | Admitting: *Deleted

## 2019-05-25 NOTE — Telephone Encounter (Signed)
Notified patient as instructed, patient pleased. Discussed follow-up appointments, patient agrees  

## 2019-05-25 NOTE — Telephone Encounter (Signed)
-----   Message from Hollice Espy, MD sent at 05/24/2019  1:28 PM EST ----- Please let this patient know that his PSA is undetectable.  We will discuss this further at his follow-up.  Awesome news.

## 2019-05-26 ENCOUNTER — Other Ambulatory Visit: Payer: Self-pay

## 2019-05-26 ENCOUNTER — Encounter: Payer: Self-pay | Admitting: Urology

## 2019-05-26 ENCOUNTER — Ambulatory Visit (INDEPENDENT_AMBULATORY_CARE_PROVIDER_SITE_OTHER): Payer: BC Managed Care – PPO | Admitting: Urology

## 2019-05-26 VITALS — BP 159/84 | HR 71 | Ht 72.0 in | Wt 258.0 lb

## 2019-05-26 DIAGNOSIS — N393 Stress incontinence (female) (male): Secondary | ICD-10-CM

## 2019-05-26 DIAGNOSIS — N5231 Erectile dysfunction following radical prostatectomy: Secondary | ICD-10-CM

## 2019-05-26 DIAGNOSIS — C61 Malignant neoplasm of prostate: Secondary | ICD-10-CM

## 2019-05-26 MED ORDER — SILDENAFIL CITRATE 20 MG PO TABS: 20.0000 mg | ORAL_TABLET | ORAL | 11 refills | Status: DC | PRN

## 2019-05-26 NOTE — Progress Notes (Signed)
05/26/2019 2:08 PM   Philip Arnold. December 06, 1960 [id number]  Referring provider: [name], MD [place],  [place] [id number]  Chief Complaint  Patient presents with  . Prostate Cancer    4wk post op    HPI: 58 year old male who returns to the office for week status post radical prostatectomy.  Surgery including lymph node dissection.  Partial nerve sparing was performed on the left with full nerve sparing on the right.  Surgery was uncomplicated.  He had no postoperative issues  Surgical pathology pT3a N0 M0.  Gleason score primarily 3+4, with some Gleason 3+5.  Extraprostatic extension was present, nonfocal with presence of bladder neck invasion.  All margins negative including bladder neck margin.  Negative seminal vesicles.  Negative lymph nodes.  PSA today is undetectable.  In terms of urinary incontinence, is doing fairly well.  He had a tough day on Saturday when he is more physically active more than several pads per day.  He was discouraged by this.  Yesterday was an excellent day although he is more sedentary.  He in fact wore only 1 pad yesterday which was not saturated.  He has been good progress.  He started doing his exercises again and has been referred back to physical therapy.  He has not had any erectile function since surgery.  Is not tried any PDE 5 inhibitors to date.   PMH: Past Medical History:  Diagnosis Date  . Cancer [facility])    Prostate Cancer  . Elevated PSA   . GERD (gastroesophageal reflux disease)   . Hip pain   . History of kidney stones   . Hyperglycemia   . Hypertension   . Obesity     Surgical History: Past Surgical History:  Procedure Laterality Date  . COLONOSCOPY WITH PROPOFOL    . PELVIC LYMPH NODE DISSECTION Bilateral 04/24/2019   Procedure: PELVIC LYMPH NODE DISSECTION;  Surgeon: Hollice Espy, MD;  Location: [facility] ORS;  Service: Urology;  Laterality: Bilateral;  . ROBOT ASSISTED LAPAROSCOPIC RADICAL  PROSTATECTOMY N/A 04/24/2019   Procedure: XI ROBOTIC ASSISTED LAPAROSCOPIC RADICAL PROSTATECTOMY;  Surgeon: Hollice Espy, MD;  Location: [facility] ORS;  Service: Urology;  Laterality: N/A;    Home Medications:  Allergies as of 05/26/2019   No Known Allergies     Medication List       Accurate as of May 26, 2019  2:08 PM. If you have any questions, ask your nurse or doctor.        STOP taking these medications   docusate sodium 100 MG capsule Commonly known as: COLACE Stopped by: Hollice Espy, MD   oxybutynin 5 MG tablet Commonly known as: DITROPAN Stopped by: Hollice Espy, MD   oxyCODONE-acetaminophen 5-325 MG tablet Commonly known as: Percocet Stopped by: Hollice Espy, MD     TAKE these medications   amLODipine 10 MG tablet Commonly known as: NORVASC Take 10 mg by mouth daily.   aspirin EC 81 MG tablet Take 81 mg by mouth daily.   fluticasone 50 MCG/ACT nasal spray Commonly known as: FLONASE Place 1 spray into both nostrils daily as needed for allergies or rhinitis.   hydroxypropyl methylcellulose / hypromellose 2.5 % ophthalmic solution Commonly known as: ISOPTO TEARS / GONIOVISC Place 1 drop into both eyes 3 (three) times daily as needed for dry eyes.   metoprolol succinate 100 MG 24 hr tablet Commonly known as: TOPROL-XL Take 100 mg by mouth daily.   multivitamin with minerals Tabs tablet  Take 1 tablet by mouth daily.   potassium chloride 10 MEQ tablet Commonly known as: KLOR-CON Take 10 mEq by mouth daily.   PriLOSEC OTC 20 MG tablet Generic drug: omeprazole Take 20 mg by mouth daily.   sildenafil 20 MG tablet Commonly known as: Revatio Take 1 tablet (20 mg total) by mouth as needed. Take 1-5 tabs as needed prior to intercourse Started by: Hollice Espy, MD   simvastatin 20 MG tablet Commonly known as: ZOCOR Take 20 mg by mouth at bedtime.   triamterene-hydrochlorothiazide 37.5-25 MG tablet Commonly known as: MAXZIDE-25 Take 1 tablet  by mouth daily.   Vitamin D3 125 MCG (5000 UT) Caps Take 5,000 Units by mouth daily.       Allergies: No Known Allergies  Family History: Family History  Problem Relation Age of Onset  . Bone cancer Mother   . Hypertension Father   . Stroke Father     Social History:  reports that he has never smoked. He has never used smokeless tobacco. He reports current alcohol use. He reports that he does not use drugs.  ROS: UROLOGY Frequent Urination?: No Hard to postpone urination?: No Burning/pain with urination?: No Get up at night to urinate?: No Leakage of urine?: No Urine stream starts and stops?: No Trouble starting stream?: No Do you have to strain to urinate?: No Blood in urine?: No Urinary tract infection?: No Sexually transmitted disease?: No Injury to kidneys or bladder?: No Painful intercourse?: No Weak stream?: No Erection problems?: No Penile pain?: No  Gastrointestinal Nausea?: No Vomiting?: No Indigestion/heartburn?: No Diarrhea?: No Constipation?: No  Constitutional Fever: No Night sweats?: No Weight loss?: No Fatigue?: No  Skin Skin rash/lesions?: No Itching?: No  Eyes Blurred vision?: No Double vision?: No  Ears/Nose/Throat Sore throat?: No Sinus problems?: No  Hematologic/Lymphatic Swollen glands?: No Easy bruising?: No  Cardiovascular Leg swelling?: No Chest pain?: No  Respiratory Cough?: No Shortness of breath?: No  Endocrine Excessive thirst?: No  Musculoskeletal Back pain?: No Joint pain?: No  Neurological Headaches?: No Dizziness?: No  Psychologic Depression?: No Anxiety?: No  Physical Exam: BP (!) 159/84   Pulse 71   Ht 6' (1.829 m)   Wt 258 lb (117 kg)   BMI 34.99 kg/m   Constitutional:  Alert and oriented, No acute distress. HEENT: [place] AT, moist mucus membranes.  Trachea midline, no masses. Cardiovascular: No clubbing, cyanosis, or edema. Respiratory: Normal respiratory effort, no increased work of  breathing. GI: Abdomen is soft, nontender, nondistended, no abdominal masses, obese, incisions well-healed without hernia Skin: No rashes, bruises or suspicious lesions. Neurologic: Grossly intact, no focal deficits, moving all 4 extremities. Psychiatric: Normal mood and affect.  Laboratory Data: Lab Results  Component Value Date   WBC 18.0 (H) 04/25/2019   HGB 12.5 (L) 04/25/2019   HCT 36.6 (L) 04/25/2019   MCV 83.0 04/25/2019   PLT 296 04/25/2019    Lab Results  Component Value Date   CREATININE 1.22 04/25/2019    Assessment & Plan:    1. Prostate cancer ([place]) pT3 N0  Reviewed his surgical pathology at length.  He does have involvement of his bladder neck although the bladder neck margin was negative.  In the past, we would recommend adjuvant radiation for positive bladder neck, however based on newer data as per most recent JAMA article in November 2020, these patients with undetectable PSA despite adverse pathology may be appropriate for salvage radiation as needed.  We will plan to manage him conservatively  and check his PSA for the first year on a every 3 month basis.  If his PSA begins to rise, could consider adjuvant therapy as needed.  He is agreeable this plan.  We will see him in 6 months with a PSA prior and lab visit only in the interim.  All questions answered. - PSA; Standing  2. Stress incontinence, male Doing remarkably well only 4 weeks post op  Encourage continue physical therapy and exercises, anticipate continued improvement  3. Erectile dysfunction after radical prostatectomy We discussed penile rehab at length today  I have encouraged him to start with sildenafil and penile stretching.  We also discussed the option of injections down the road should he be interested in this.  All questions answered today    Return in about 6 months (around 11/24/2019) for 3 months lab visit for PSA, 6 month MD visit with PSA.  Hollice Espy, MD  [facility] [address], [place], [place] [address] [phone]

## 2019-05-29 ENCOUNTER — Other Ambulatory Visit: Payer: Self-pay

## 2019-05-29 DIAGNOSIS — Z20822 Contact with and (suspected) exposure to covid-19: Secondary | ICD-10-CM

## 2019-05-30 LAB — NOVEL CORONAVIRUS, NAA: SARS-CoV-2, NAA: NOT DETECTED

## 2019-08-28 ENCOUNTER — Ambulatory Visit: Payer: BC Managed Care – PPO | Attending: Internal Medicine

## 2019-08-28 DIAGNOSIS — Z23 Encounter for immunization: Secondary | ICD-10-CM

## 2019-08-28 NOTE — Progress Notes (Signed)
   [id number]ination [facility]  Name:  Bruk Chopin.    MRN: [id number] DOB: 1960/12/05  08/28/2019  Mr. Felde was observed post Covid-19 immunization for 15 minutes without incident. He was provided with Vaccine Information Sheet and instruction to access the V-Safe system.   Mr. Mohl was instructed to call 911 with any severe reactions post vaccine: Marland Kitchen Difficulty breathing  . Swelling of face and throat  . A fast heartbeat  . A bad rash all over body  . Dizziness and weakness   Immunizations Administered    Name Date Dose VIS Date Route   Moderna COVID-19 Vaccine 08/28/2019  1:07 PM 0.5 mL 05/19/2019 Intramuscular   Manufacturer: Moderna   Lot: [id number]   [id number]

## 2019-09-29 ENCOUNTER — Ambulatory Visit: Payer: BC Managed Care – PPO | Attending: Internal Medicine

## 2019-09-29 DIAGNOSIS — Z23 Encounter for immunization: Secondary | ICD-10-CM

## 2019-09-29 NOTE — Progress Notes (Signed)
   [id number] Vaccination [facility]  Name:  Philip Arnold.    MRN: [id number] DOB: 1960-08-21  09/29/2019  [name] was observed post Covid-19 immunization for 15 minutes without incident. He was provided with Vaccine Information Sheet and instruction to access the V-Safe system.   [name] was instructed to call 911 with any severe reactions post vaccine: Marland Kitchen Difficulty breathing  . Swelling of face and throat  . A fast heartbeat  . A bad rash all over body  . Dizziness and weakness   Immunizations Administered    Name Date Dose VIS Date Route   Moderna COVID-19 Vaccine 09/29/2019  1:09 PM 0.5 mL 05/19/2019 Intramuscular   Manufacturer: Moderna   Lot: [id number]   [id number]

## 2019-11-23 ENCOUNTER — Other Ambulatory Visit: Payer: Self-pay

## 2019-11-23 ENCOUNTER — Other Ambulatory Visit: Payer: BC Managed Care – PPO

## 2019-11-23 DIAGNOSIS — C61 Malignant neoplasm of prostate: Secondary | ICD-10-CM

## 2019-11-24 LAB — PSA: Prostate Specific Ag, Serum: <0.1 ng/mL (ref 0.0–4.0)

## 2019-11-24 NOTE — Progress Notes (Signed)
11/25/19  12:40 PM   Philip Arnold. [birth year] [id number]  Referring provider: [name], MD [place],  [place] [address] Chief Complaint  Patient presents with   Prostate Cancer    HPI: Philip Minix. is a 59 y.o. M who returns today for 6 month f/u for the evaluation and management of prostate cancer. He is status post radical prostatectomy including lymph node dissection.  Partial nerve sparing was performed on the left with full nerve sparing on the right.  Surgical pathology pT3a N0 M0.  Gleason score primarily 3+4, with some Gleason 3+5.  Extraprostatic extension was present, nonfocal with presence of bladder neck invasion.  All margins negative including bladder neck margin.  Negative seminal vesicles.  Negative lymph nodes.  PSA from 11/23/19 undetectable.   He reports of incontinence every so often. He has been able to make it to the bathroom on time and is satisfied w/ his symptoms. He wears a safety pad for the most part and leaks with strenuous activity only.  Dry at night.    He states of having partial fullness w/ erections when he uses all 5 tablets of sildenafil however not firm enough for penetration.  His wife is not currently interested in intercourse due to health issues.  PMH: Past Medical History:  Diagnosis Date   Cancer [facility])    Prostate Cancer   Elevated PSA    GERD (gastroesophageal reflux disease)    Hip pain    History of kidney stones    Hyperglycemia    Hypertension    Obesity     Surgical History: Past Surgical History:  Procedure Laterality Date   COLONOSCOPY WITH PROPOFOL     PELVIC LYMPH NODE DISSECTION Bilateral 04/24/2019   Procedure: PELVIC LYMPH NODE DISSECTION;  Surgeon: Hollice Espy, MD;  Location: [facility] ORS;  Service: Urology;  Laterality: Bilateral;   ROBOT ASSISTED LAPAROSCOPIC RADICAL PROSTATECTOMY N/A 04/24/2019   Procedure: XI ROBOTIC ASSISTED LAPAROSCOPIC RADICAL PROSTATECTOMY;   Surgeon: Hollice Espy, MD;  Location: [facility] ORS;  Service: Urology;  Laterality: N/A;    Home Medications:  Allergies as of 11/25/2019   No Known Allergies     Medication List       Accurate as of November 25, 2019 11:59 PM. If you have any questions, ask your nurse or doctor.        amLODipine 10 MG tablet Commonly known as: NORVASC Take 10 mg by mouth daily.   aspirin EC 81 MG tablet Take 81 mg by mouth daily.   fluticasone 50 MCG/ACT nasal spray Commonly known as: FLONASE Place 1 spray into both nostrils daily as needed for allergies or rhinitis.   hydroxypropyl methylcellulose / hypromellose 2.5 % ophthalmic solution Commonly known as: ISOPTO TEARS / GONIOVISC Place 1 drop into both eyes 3 (three) times daily as needed for dry eyes.   metoprolol succinate 100 MG 24 hr tablet Commonly known as: TOPROL-XL Take 100 mg by mouth daily.   multivitamin with minerals Tabs tablet Take 1 tablet by mouth daily.   potassium chloride 10 MEQ tablet Commonly known as: KLOR-CON Take by mouth. What changed: Another medication with the same name was removed. Continue taking this medication, and follow the directions you see here. Changed by: Hollice Espy, MD   PriLOSEC OTC 20 MG tablet Generic drug: omeprazole Take 20 mg by mouth daily.   sildenafil 20 MG tablet Commonly known as: Revatio Take 1 tablet (20 mg total) by  mouth as needed. Take 1-5 tabs as needed prior to intercourse   simvastatin 20 MG tablet Commonly known as: ZOCOR Take 20 mg by mouth at bedtime.   triamterene-hydrochlorothiazide 37.5-25 MG tablet Commonly known as: MAXZIDE-25 Take 1 tablet by mouth daily.   Vitamin D3 125 MCG (5000 UT) Caps Take 5,000 Units by mouth daily.       Allergies: No Known Allergies  Family History: Family History  Problem Relation Age of Onset   Bone cancer Mother    Hypertension Father    Stroke Father     Social History:  reports that he has never smoked. He  has never used smokeless tobacco. He reports current alcohol use. He reports that he does not use drugs.   Physical Exam: BP 138/84    Pulse 77    Ht 6' (1.829 m)    Wt 274 lb (124.3 kg)    BMI 37.16 kg/m   Constitutional:  Alert and oriented, No acute distress. HEENT: [place] AT, moist mucus membranes.  Trachea midline, no masses. Cardiovascular: No clubbing, cyanosis, or edema. Respiratory: Normal respiratory effort, no increased work of breathing. Skin: No rashes, bruises or suspicious lesions. Neurologic: Grossly intact, no focal deficits, moving all 4 extremities. Psychiatric: Normal mood and affect.  Assessment & Plan:    1. Prostate cancer  pT3 N0 PSA undetectable as of 11/23/19 We will plan to manage him conservatively and check his PSA for the first year on a every 6 month basis.  If his PSA begins to rise, could consider adjuvant/ salvacge therapy as needed. He is agreeable this plan.  2. Stress incontinence, male Encourage continue physical therapy and exercises, anticipate continued improvement  3. Erectile dysfunction after radical prostatectomy We discussed penile rehab at length today Continue sildenafil and penile stretching.   We also discussed the option of injections again today should he be interested in this, declined.   All questions answered today  PSA only in 6 months, PSA with MD visit in [place] [address], [place], [place] [address] [phone]  I, [name], am acting as a scribe for Dr. Hollice Espy,  I have reviewed the above documentation for accuracy and completeness, and I agree with the above.   Hollice Espy, MD

## 2019-11-25 ENCOUNTER — Ambulatory Visit (INDEPENDENT_AMBULATORY_CARE_PROVIDER_SITE_OTHER): Payer: BC Managed Care – PPO | Admitting: Urology

## 2019-11-25 ENCOUNTER — Other Ambulatory Visit: Payer: Self-pay

## 2019-11-25 VITALS — BP 138/84 | HR 77 | Ht 72.0 in | Wt 274.0 lb

## 2019-11-25 DIAGNOSIS — N393 Stress incontinence (female) (male): Secondary | ICD-10-CM | POA: Diagnosis not present

## 2019-11-25 DIAGNOSIS — N5231 Erectile dysfunction following radical prostatectomy: Secondary | ICD-10-CM

## 2019-11-25 DIAGNOSIS — C61 Malignant neoplasm of prostate: Secondary | ICD-10-CM

## 2020-01-05 IMAGING — MR MR PROSTATE WO/W CM
56 series · 56 of 56 positions shown · IV contrast (10ml Gadavist)
Comparison: None.

CLINICAL DATA: Recent elevation of PSA level at 7.9. Gleason 3+4=7
prostate adenocarcinoma in 9% of a core at the left apex on
08/19/2018.

EXAM:
MR PROSTATE WITHOUT AND WITH CONTRAST
TECHNIQUE: Multiplanar multisequence MRI images were obtained of the pelvis
centered about the prostate. Pre and post contrast images were
obtained.
CONTRAST:  10mL GADAVIST GADOBUTROL 1 MMOL/ML IV SOLN

[Series 3: T1 · axial · 8.0mm · 0.74mm/px · 1 of 25 slices shown (1 of 48)]
[im 1/25]
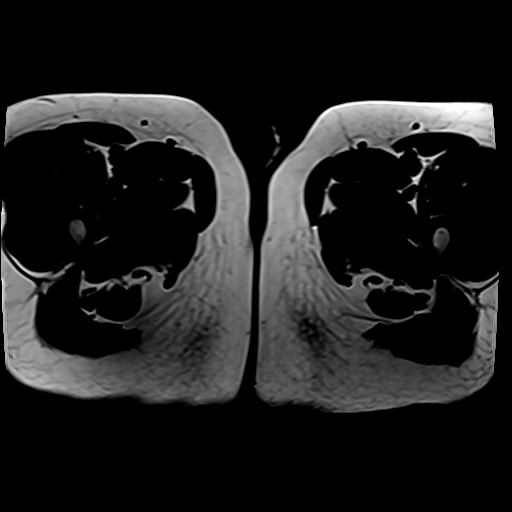

[Series 4: bSSFP fat-sat · axial · 8.0mm · 0.74mm/px · 1 of 25 slices shown]
[im 1/25]
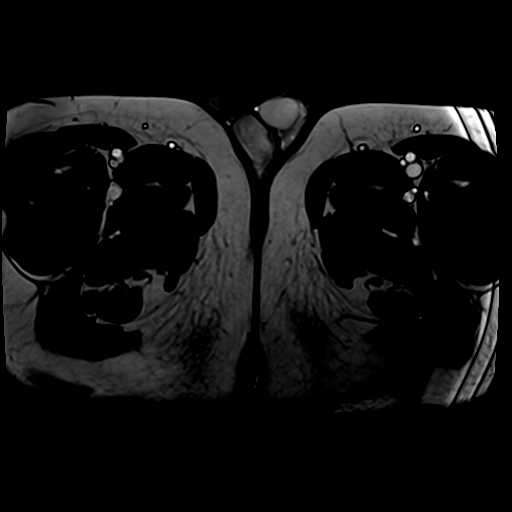

[Series 5: T2 · coronal · 3.0mm · 0.70mm/px · 1 of 33 slices shown (1 of 3)]
[im 1/33]
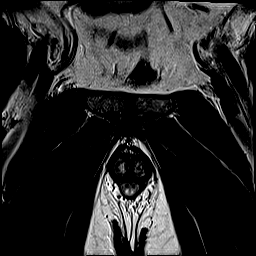

[Series 6: T2 · sagittal · 3.5mm · 0.62mm/px · 1 of 39 slices shown (2 of 3)]
[im 1/39]
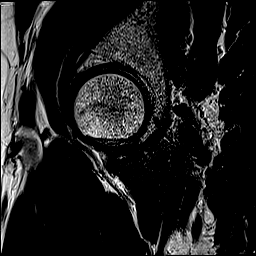

[Series 7: T1 · axial · 3.0mm · 0.35mm/px · 1 of 33 slices shown (2 of 48)]
[im 1/33]
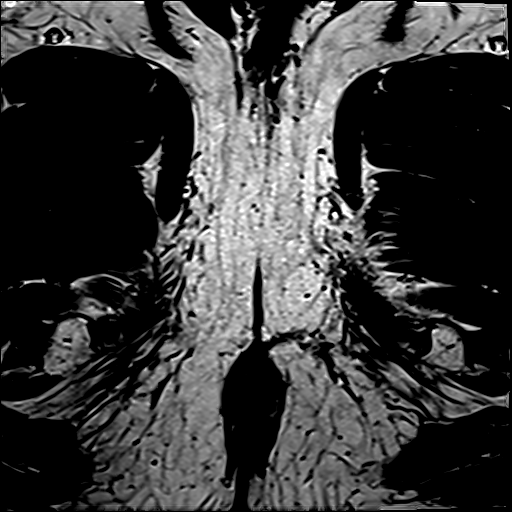

[Series 8: T2 · axial · 3.5mm · 0.56mm/px · 1 of 27 slices shown (3 of 3)]
[im 1/27]
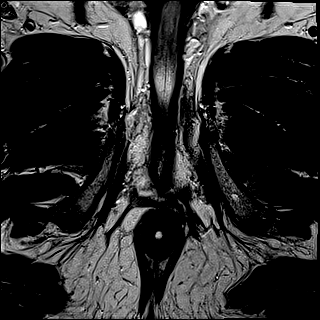

[Series 9: ax dwi_tracew · axial · 3.5mm · 0.78mm/px · 1 of 81 slices shown]
[im 1/81]
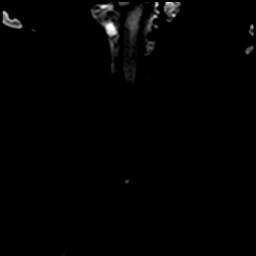

[Series 10: ax dwi_adc · axial · 3.5mm · 0.78mm/px · 1 of 27 slices shown]
[im 1/27]
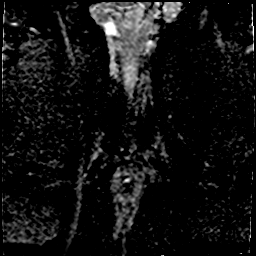

[Series 11: ax dwi_calc_bval · axial · 3.5mm · 0.78mm/px · 1 of 27 slices shown]
[im 1/27]
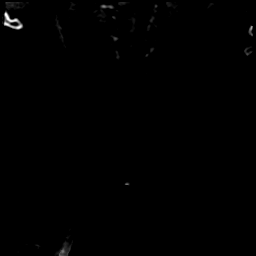

[Series 12: t2_space_tra_p2_288 · axial · 1.0mm · 1.04mm/px · 1 of 88 slices shown]
[im 1/88]
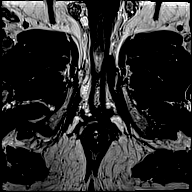

[Series 13: T1 · axial · 3.0mm · 1.15mm/px · 1 of 32 slices shown (3 of 48)]
[im 1/32]
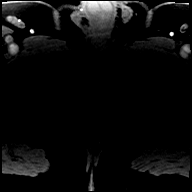

[Series 14: T1 · axial · 3.0mm · 1.15mm/px · 1 of 32 slices shown (4 of 48)]
[im 1/32]
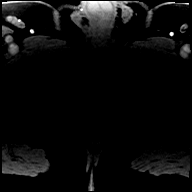

[Series 15: T1 · axial · 3.0mm · 1.15mm/px · 1 of 27 slices shown (5 of 48)]
[im 1/27]
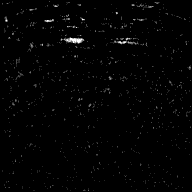

[Series 16: T1 · axial · 3.0mm · 1.15mm/px · 1 of 32 slices shown (6 of 48)]
[im 1/32]
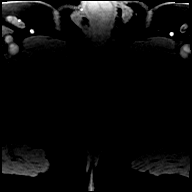

[Series 17: T1 · axial · 3.0mm · 1.15mm/px · 1 of 31 slices shown (7 of 48)]
[im 1/31]
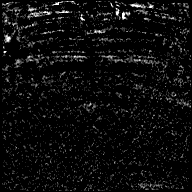

[Series 18: T1 · axial · 3.0mm · 1.15mm/px · 1 of 32 slices shown (8 of 48)]
[im 1/32]
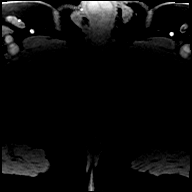

[Series 19: T1 · axial · 3.0mm · 1.15mm/px · 1 of 30 slices shown (9 of 48)]
[im 1/30]
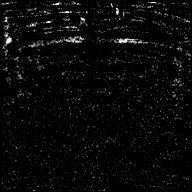

[Series 20: T1 · axial · 3.0mm · 1.15mm/px · 1 of 32 slices shown (10 of 48)]
[im 1/32]
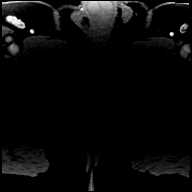

[Series 21: T1 · axial · 3.0mm · 1.15mm/px · 1 of 32 slices shown (11 of 48)]
[im 1/32]
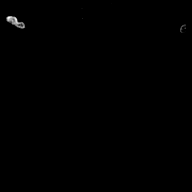

[Series 22: T1 · axial · 3.0mm · 1.15mm/px · 1 of 32 slices shown (12 of 48)]
[im 1/32]
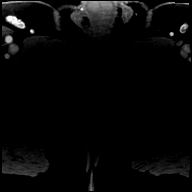

[Series 23: T1 · axial · 3.0mm · 1.15mm/px · 1 of 32 slices shown (13 of 48)]
[im 1/32]
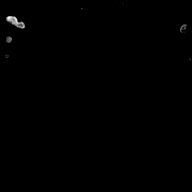

[Series 24: T1 · axial · 3.0mm · 1.15mm/px · 1 of 32 slices shown (14 of 48)]
[im 1/32]
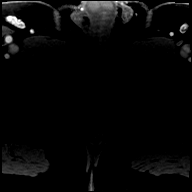

[Series 25: T1 · axial · 3.0mm · 1.15mm/px · 1 of 32 slices shown (15 of 48)]
[im 1/32]
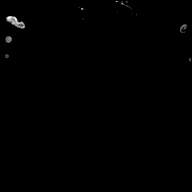

[Series 26: T1 · axial · 3.0mm · 1.15mm/px · 1 of 32 slices shown (16 of 48)]
[im 1/32]
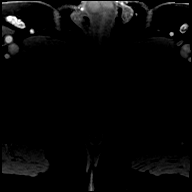

[Series 27: T1 · axial · 3.0mm · 1.15mm/px · 1 of 32 slices shown (17 of 48)]
[im 1/32]
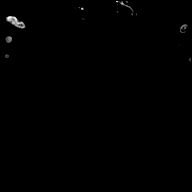

[Series 28: T1 · axial · 3.0mm · 1.15mm/px · 1 of 32 slices shown (18 of 48)]
[im 1/32]
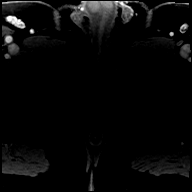

[Series 29: T1 · axial · 3.0mm · 1.15mm/px · 1 of 32 slices shown (19 of 48)]
[im 1/32]
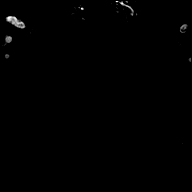

[Series 30: T1 · axial · 3.0mm · 1.15mm/px · 1 of 32 slices shown (20 of 48)]
[im 1/32]
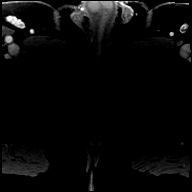

[Series 31: T1 · axial · 3.0mm · 1.15mm/px · 1 of 32 slices shown (21 of 48)]
[im 1/32]
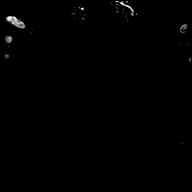

[Series 32: T1 · axial · 3.0mm · 1.15mm/px · 1 of 32 slices shown (22 of 48)]
[im 1/32]
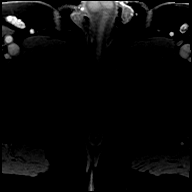

[Series 33: T1 · axial · 3.0mm · 1.15mm/px · 1 of 32 slices shown (23 of 48)]
[im 1/32]
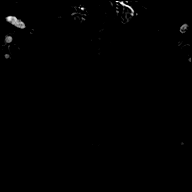

[Series 34: T1 · axial · 3.0mm · 1.15mm/px · 1 of 32 slices shown (24 of 48)]
[im 1/32]
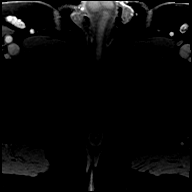

[Series 35: T1 · axial · 3.0mm · 1.15mm/px · 1 of 32 slices shown (25 of 48)]
[im 1/32]
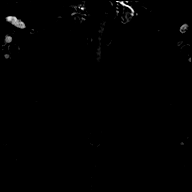

[Series 36: T1 · axial · 3.0mm · 1.15mm/px · 1 of 32 slices shown (26 of 48)]
[im 1/32]
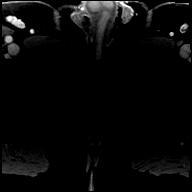

[Series 37: T1 · axial · 3.0mm · 1.15mm/px · 1 of 32 slices shown (27 of 48)]
[im 1/32]
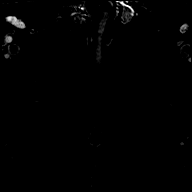

[Series 38: T1 · axial · 3.0mm · 1.15mm/px · 1 of 32 slices shown (28 of 48)]
[im 1/32]
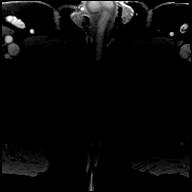

[Series 39: T1 · axial · 3.0mm · 1.15mm/px · 1 of 32 slices shown (29 of 48)]
[im 1/32]
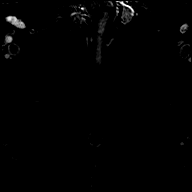

[Series 40: T1 · axial · 3.0mm · 1.15mm/px · 1 of 32 slices shown (30 of 48)]
[im 1/32]
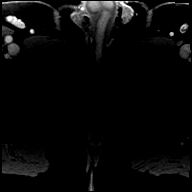

[Series 41: T1 · axial · 3.0mm · 1.15mm/px · 1 of 32 slices shown (31 of 48)]
[im 1/32]
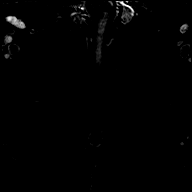

[Series 42: T1 · axial · 3.0mm · 1.15mm/px · 1 of 32 slices shown (32 of 48)]
[im 1/32]
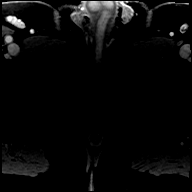

[Series 43: T1 · axial · 3.0mm · 1.15mm/px · 1 of 32 slices shown (33 of 48)]
[im 1/32]
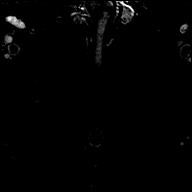

[Series 44: T1 · axial · 3.0mm · 1.15mm/px · 1 of 32 slices shown (34 of 48)]
[im 1/32]
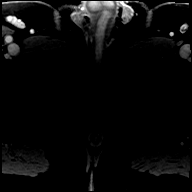

[Series 45: T1 · axial · 3.0mm · 1.15mm/px · 1 of 32 slices shown (35 of 48)]
[im 1/32]
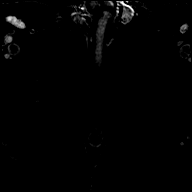

[Series 46: T1 · axial · 3.0mm · 1.15mm/px · 1 of 32 slices shown (36 of 48)]
[im 1/32]
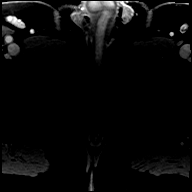

[Series 47: T1 · axial · 3.0mm · 1.15mm/px · 1 of 32 slices shown (37 of 48)]
[im 1/32]
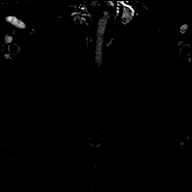

[Series 48: T1 · axial · 3.0mm · 1.15mm/px · 1 of 32 slices shown (38 of 48)]
[im 1/32]
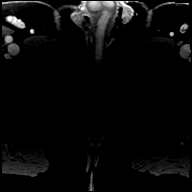

[Series 49: T1 · axial · 3.0mm · 1.15mm/px · 1 of 32 slices shown (39 of 48)]
[im 1/32]
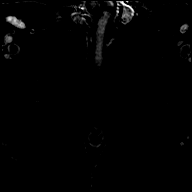

[Series 50: T1 · axial · 3.0mm · 1.15mm/px · 1 of 32 slices shown (40 of 48)]
[im 1/32]
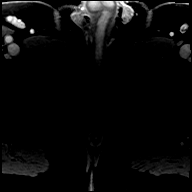

[Series 51: T1 · axial · 3.0mm · 1.15mm/px · 1 of 32 slices shown (41 of 48)]
[im 1/32]
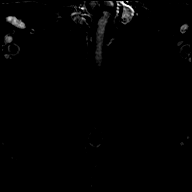

[Series 52: T1 · axial · 3.0mm · 1.15mm/px · 1 of 32 slices shown (42 of 48)]
[im 1/32]
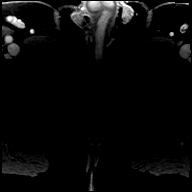

[Series 53: T1 · axial · 3.0mm · 1.15mm/px · 1 of 32 slices shown (43 of 48)]
[im 1/32]
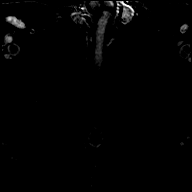

[Series 54: T1 · axial · 3.0mm · 1.15mm/px · 1 of 32 slices shown (44 of 48)]
[im 1/32]
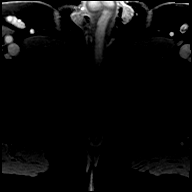

[Series 55: T1 · axial · 3.0mm · 1.15mm/px · 1 of 32 slices shown (45 of 48)]
[im 1/32]
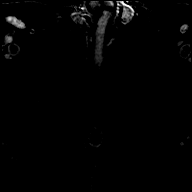

[Series 56: T1 · axial · 3.0mm · 1.15mm/px · 1 of 32 slices shown (46 of 48)]
[im 1/32]
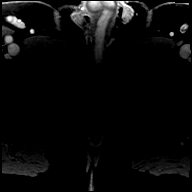

[Series 57: T1 · axial · 3.0mm · 1.15mm/px · 1 of 32 slices shown (47 of 48)]
[im 1/32]
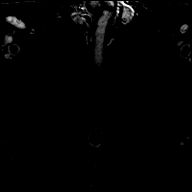

[Series 58: T1 · axial · 3.0mm · 1.15mm/px · 1 of 32 slices shown (48 of 48)]
[im 1/32]
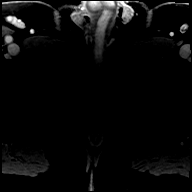

[56 of 56 positions shown; findings below may reference images not displayed]

FINDINGS: Prostate:

Region of interest # 1: PI-RADS category 5 lesion of the left
anterior peripheral zone of the mid gland extending into the apex
with low ADC map activity, very high diffusion weighted restriction,
and early enhancement. This lesion measures 1.7 by 1.1 by 1.4 cm
(0.92 cubic cm).

Otherwise there is hazy and nonfocal T2 stranding throughout the
peripheral zone of the prostate gland bilaterally with generalized
enhancement.

Volume: 3D volumetric analysis: The prostate measures 4.1 by 3.6 by
4.1 cm (29.28 cubic cm).

Transcapsular spread: Not directly visualized, but there is 1.6 cm
of contact of the lesion designated in region of interest # 1 with
the capsular surface, which does increase risk of occult
transcapsular spread.

Seminal vesicle involvement: Absent

Neurovascular bundle involvement: Absent

Pelvic adenopathy: Absent

Bone metastasis: Absent

Other findings: Mild sigmoid colon diverticulosis.
IMPRESSION: 1. PI-RADS category 5 lesion of the left anterior peripheral zone in
mid gland extending into the apex. Appropriate targeting data sent
to [facility]. This lesion has 1.6 cm with contact with the capsule, and
although trans capsular spread is not directly identified, this
magnitude of contact with the capsule does increase risk of occult
trans capsular spread.
2. Mild sigmoid colon diverticulosis.

## 2020-05-09 ENCOUNTER — Other Ambulatory Visit: Payer: Self-pay | Admitting: Family Medicine

## 2020-05-26 ENCOUNTER — Other Ambulatory Visit: Payer: Self-pay

## 2020-05-26 ENCOUNTER — Other Ambulatory Visit: Payer: Self-pay | Admitting: Family Medicine

## 2020-05-26 ENCOUNTER — Other Ambulatory Visit: Payer: BC Managed Care – PPO

## 2020-05-26 DIAGNOSIS — C61 Malignant neoplasm of prostate: Secondary | ICD-10-CM

## 2020-05-27 LAB — PSA: Prostate Specific Ag, Serum: <0.1 ng/mL (ref 0.0–4.0)

## 2020-06-01 ENCOUNTER — Other Ambulatory Visit: Payer: Self-pay

## 2020-06-01 ENCOUNTER — Ambulatory Visit: Payer: BC Managed Care – PPO | Admitting: Urology

## 2020-06-01 ENCOUNTER — Encounter: Payer: Self-pay | Admitting: Urology

## 2020-06-01 VITALS — BP 148/81 | HR 65

## 2020-06-01 DIAGNOSIS — N5231 Erectile dysfunction following radical prostatectomy: Secondary | ICD-10-CM

## 2020-06-01 DIAGNOSIS — C61 Malignant neoplasm of prostate: Secondary | ICD-10-CM

## 2020-06-01 DIAGNOSIS — N393 Stress incontinence (female) (male): Secondary | ICD-10-CM | POA: Diagnosis not present

## 2020-06-01 NOTE — Progress Notes (Signed)
06/01/2020 9:11 AM   Philip Arnold. August 10, 1960 [id number]  Referring provider: [name], MD [place],  [place] [address]  Chief Complaint  Patient presents with  . Prostate Cancer    HPI: Philip Arnold. is a 59 y.o. M    He is status post radical prostatectomy including lymph node dissection 05/08/2019. Partial nerve sparing was performed on the left with full nerve sparing on the right.  Surgical pathologypT3a N0 M0.Gleason score primarily 3+4, with some Gleason 3+5. Extraprostatic extension was present, nonfocal with presence of bladder neck invasion. All margins negative including bladder neck margin. Negative seminal vesicles. Negative lymph nodes.  PSA remains undetectable.  He reports that he has gained some weight recently secondary to a knee injury, unable to walk longer distances like he had been doing.  In terms of continence, he sometimes wears a safety pad but is gone to the point where he no longer has to wear 1 most of the time.  He is learned to watch his beverage intake when taking long car trips, etc. and adapting.  He is not particularly bothered by his urinary symptoms.  In terms of erections, he is able to achieve satisfactory erections with PDE 5 inhibitors.  He is satisfied with this.  PMH: Past Medical History:  Diagnosis Date  . Cancer [facility])    Prostate Cancer  . Elevated PSA   . GERD (gastroesophageal reflux disease)   . Hip pain   . History of kidney stones   . Hyperglycemia   . Hypertension   . Obesity     Surgical History: Past Surgical History:  Procedure Laterality Date  . COLONOSCOPY WITH PROPOFOL    . PELVIC LYMPH NODE DISSECTION Bilateral 04/24/2019   Procedure: PELVIC LYMPH NODE DISSECTION;  Surgeon: Hollice Espy, MD;  Location: [facility] ORS;  Service: Urology;  Laterality: Bilateral;  . ROBOT ASSISTED LAPAROSCOPIC RADICAL PROSTATECTOMY N/A 04/24/2019   Procedure: XI ROBOTIC ASSISTED  LAPAROSCOPIC RADICAL PROSTATECTOMY;  Surgeon: Hollice Espy, MD;  Location: [facility] ORS;  Service: Urology;  Laterality: N/A;    Home Medications:  Allergies as of 06/01/2020   No Known Allergies     Medication List       Accurate as of June 01, 2020  9:11 AM. If you have any questions, ask your nurse or doctor.        STOP taking these medications   tamsulosin 0.4 MG Caps capsule Commonly known as: FLOMAX Stopped by: Hollice Espy, MD     TAKE these medications   amLODipine 10 MG tablet Commonly known as: NORVASC Take 10 mg by mouth daily.   aspirin EC 81 MG tablet Take 81 mg by mouth daily.   fluticasone 50 MCG/ACT nasal spray Commonly known as: FLONASE Place 1 spray into both nostrils daily as needed for allergies or rhinitis.   hydroxypropyl methylcellulose / hypromellose 2.5 % ophthalmic solution Commonly known as: ISOPTO TEARS / GONIOVISC Place 1 drop into both eyes 3 (three) times daily as needed for dry eyes.   metoprolol succinate 100 MG 24 hr tablet Commonly known as: TOPROL-XL Take 100 mg by mouth daily.   multivitamin with minerals Tabs tablet Take 1 tablet by mouth daily.   omeprazole 20 MG tablet Commonly known as: PRILOSEC OTC Take 20 mg by mouth daily.   potassium chloride 10 MEQ tablet Commonly known as: KLOR-CON Take by mouth.   sildenafil 20 MG tablet Commonly known as: Revatio Take 1 tablet (  20 mg total) by mouth as needed. Take 1-5 tabs as needed prior to intercourse   simvastatin 20 MG tablet Commonly known as: ZOCOR Take 20 mg by mouth at bedtime.   triamterene-hydrochlorothiazide 37.5-25 MG tablet Commonly known as: MAXZIDE-25 Take 1 tablet by mouth daily.   Vitamin D3 125 MCG (5000 UT) Caps Take 5,000 Units by mouth daily.       Allergies: No Known Allergies  Family History: Family History  Problem Relation Age of Onset  . Bone cancer Mother   . Hypertension Father   . Stroke Father     Social History:   reports that he has never smoked. He has never used smokeless tobacco. He reports current alcohol use. He reports that he does not use drugs.   Physical Exam: BP (!) 148/81   Pulse 65   Constitutional:  Alert and oriented, No acute distress. HEENT: [place] AT, moist mucus membranes.  Trachea midline, no masses. Cardiovascular: No clubbing, cyanosis, or edema. Respiratory: Normal respiratory effort, no increased work of breathing. Skin: No rashes, bruises or suspicious lesions. Neurologic: Grossly intact, no focal deficits, moving all 4 extremities. Psychiatric: Normal mood and affect.   Assessment & Plan:    1. Prostate cancer ([place]) NED PSA remains undetectable but given presence of extension and bladder neck and other adverse features, will continue to follow closely with every 6 month PSA He is agreeable this plan  2. Stress incontinence, male Minimal, safety pad only Encourage weight loss  3. Erectile dysfunction after radical prostatectomy Continue sildenafil as needed, not interested in further intervention or therapy at this time   6 mo with PSA prior  Hollice Espy, MD  [place] [address], [place],  [address] [phone]

## 2020-10-04 ENCOUNTER — Other Ambulatory Visit: Payer: Self-pay | Admitting: Family Medicine

## 2020-10-04 DIAGNOSIS — C61 Malignant neoplasm of prostate: Secondary | ICD-10-CM

## 2020-11-30 ENCOUNTER — Other Ambulatory Visit: Payer: BC Managed Care – PPO

## 2020-11-30 ENCOUNTER — Other Ambulatory Visit: Payer: Self-pay

## 2020-11-30 DIAGNOSIS — C61 Malignant neoplasm of prostate: Secondary | ICD-10-CM

## 2020-12-01 LAB — PSA: Prostate Specific Ag, Serum: <0.1 ng/mL (ref 0.0–4.0)

## 2020-12-06 ENCOUNTER — Ambulatory Visit (INDEPENDENT_AMBULATORY_CARE_PROVIDER_SITE_OTHER): Payer: BC Managed Care – PPO | Admitting: Urology

## 2020-12-06 ENCOUNTER — Encounter: Payer: Self-pay | Admitting: Urology

## 2020-12-06 ENCOUNTER — Other Ambulatory Visit: Payer: Self-pay

## 2020-12-06 VITALS — BP 168/88 | HR 67 | Ht 72.0 in | Wt 283.0 lb

## 2020-12-06 DIAGNOSIS — N393 Stress incontinence (female) (male): Secondary | ICD-10-CM | POA: Diagnosis not present

## 2020-12-06 DIAGNOSIS — N5231 Erectile dysfunction following radical prostatectomy: Secondary | ICD-10-CM | POA: Diagnosis not present

## 2020-12-06 DIAGNOSIS — C61 Malignant neoplasm of prostate: Secondary | ICD-10-CM | POA: Diagnosis not present

## 2020-12-06 MED ORDER — SILDENAFIL CITRATE 20 MG PO TABS: 20.0000 mg | ORAL_TABLET | ORAL | 11 refills | Status: DC | PRN

## 2020-12-06 NOTE — Progress Notes (Signed)
12/06/2020 9:04 AM   Philip Arnold. 13-Jun-1961 [id number]  Referring provider: [name], MD [place],  [place] [address]  Chief Complaint  Patient presents with   Prostate Cancer    HPI: 60 year old male with a personal history of prostate cancer returns today for routine follow-up.   He is status post radical prostatectomy including lymph node dissection 05/08/2019.  Partial nerve sparing was performed on the left with full nerve sparing on the right.   Surgical pathology pT3a N0 M0.  Gleason score primarily 3+4, with some Gleason 3+5.  Extraprostatic extension was present, nonfocal with presence of bladder neck invasion.  All margins negative including bladder neck margin.  Negative seminal vesicles.  Negative lymph nodes.   PSA remains undetectable as of 11/30/20.  He continues to carry an emergency pad with him just in case he has risk factors for leakage like not close to a bathroom or with strenuous activity.  This is primarily as a safety pad.  Overall, he is doing very well from a continence standpoint.  He is run out of sildenafil.  This was helpful in maintaining and achieving erections.  He is able to get some spontaneous erections but not consistently.   PMH: Past Medical History:  Diagnosis Date   Cancer [facility])    Prostate Cancer   Elevated PSA    GERD (gastroesophageal reflux disease)    Hip pain    History of kidney stones    Hyperglycemia    Hypertension    Obesity     Surgical History: Past Surgical History:  Procedure Laterality Date   COLONOSCOPY WITH PROPOFOL     PELVIC LYMPH NODE DISSECTION Bilateral 04/24/2019   Procedure: PELVIC LYMPH NODE DISSECTION;  Surgeon: Hollice Espy, MD;  Location: [facility] ORS;  Service: Urology;  Laterality: Bilateral;   ROBOT ASSISTED LAPAROSCOPIC RADICAL PROSTATECTOMY N/A 04/24/2019   Procedure: XI ROBOTIC ASSISTED LAPAROSCOPIC RADICAL PROSTATECTOMY;  Surgeon: Hollice Espy, MD;   Location: [facility] ORS;  Service: Urology;  Laterality: N/A;    Home Medications:  Allergies as of 12/06/2020   No Known Allergies      Medication List        Accurate as of December 06, 2020  9:04 AM. If you have any questions, ask your nurse or doctor.          amLODipine 10 MG tablet Commonly known as: NORVASC Take 10 mg by mouth daily.   aspirin EC 81 MG tablet Take 81 mg by mouth daily.   fluticasone 50 MCG/ACT nasal spray Commonly known as: FLONASE Place 1 spray into both nostrils daily as needed for allergies or rhinitis.   hydroxypropyl methylcellulose / hypromellose 2.5 % ophthalmic solution Commonly known as: ISOPTO TEARS / GONIOVISC Place 1 drop into both eyes 3 (three) times daily as needed for dry eyes.   metoprolol succinate 100 MG 24 hr tablet Commonly known as: TOPROL-XL Take 100 mg by mouth daily.   multivitamin with minerals Tabs tablet Take 1 tablet by mouth daily.   omeprazole 20 MG tablet Commonly known as: PRILOSEC OTC Take 20 mg by mouth daily.   potassium chloride 10 MEQ tablet Commonly known as: KLOR-CON Take by mouth 3 (three) times daily.   sildenafil 20 MG tablet Commonly known as: Revatio Take 1 tablet (20 mg total) by mouth as needed. Take 1-5 tabs as needed prior to intercourse   simvastatin 20 MG tablet Commonly known as: ZOCOR Take 20 mg by mouth  at bedtime.   triamterene-hydrochlorothiazide 37.5-25 MG tablet Commonly known as: MAXZIDE-25 Take 1 tablet by mouth daily.   Vitamin D3 125 MCG (5000 UT) Caps Take 5,000 Units by mouth daily.        Allergies: No Known Allergies  Family History: Family History  Problem Relation Age of Onset   Bone cancer Mother    Hypertension Father    Stroke Father     Social History:  reports that he has never smoked. He has never used smokeless tobacco. He reports current alcohol use. He reports that he does not use drugs.   Physical Exam: BP (!) 168/88   Pulse 67   Ht 6' (1.829  m)   Wt 283 lb (128.4 kg)   BMI 38.38 kg/m   Constitutional:  Alert and oriented, No acute distress. HEENT: [place] AT, moist mucus membranes.  Trachea midline, no masses. Cardiovascular: No clubbing, cyanosis, or edema. Respiratory: Normal respiratory effort, no increased work of breathing. GI: Abdomen is soft, nontender, nondistended, no abdominal masses Skin: No rashes, bruises or suspicious lesions. Neurologic: Grossly intact, no focal deficits, moving all 4 extremities. Psychiatric: Normal mood and affect.   Assessment & Plan:    1. Prostate cancer ([place]) PSA remains undetectable, no evidence of disease  Given his history of bladder neck invasion on pathology, continue to follow closely and consider salvage if his PSA begins to rise.  Recheck PSA in 6 months with lab visit only and then MD visit in 6 months with repeat PSA. - PSA; Future - PSA; Future  2. Stress incontinence, male We discussed the importance of weight loss as it relates to urinary incontinence.  Overall continence is minimal but would improve with reduction in abdominal girth.  3. Erectile dysfunction after radical prostatectomy Sildenafil prescription sent to [facility] in [place], use as needed    Hollice Espy, MD  [place] [address], [place], [place] [address] [phone]

## 2021-06-08 ENCOUNTER — Other Ambulatory Visit: Payer: BC Managed Care – PPO

## 2021-06-08 ENCOUNTER — Other Ambulatory Visit: Payer: Self-pay

## 2021-06-08 DIAGNOSIS — C61 Malignant neoplasm of prostate: Secondary | ICD-10-CM

## 2021-06-09 ENCOUNTER — Other Ambulatory Visit: Payer: Self-pay

## 2021-06-09 LAB — PSA: Prostate Specific Ag, Serum: <0.1 ng/mL (ref 0.0–4.0)

## 2021-06-16 ENCOUNTER — Telehealth: Payer: Self-pay

## 2021-06-16 NOTE — Telephone Encounter (Signed)
Notified patient as advised, pt's wife (OK per DPR) expressed understanding.

## 2021-06-16 NOTE — Telephone Encounter (Signed)
-----   Message from Debroah Loop, [place] sent at 06/16/2021  9:48 AM EST ----- PSA remains undetectable, great news, keep follow up mid-next year. ----- Message ----- From: Interface, [facility] Lab Results In Sent: 06/09/2021   5:37 AM EST To: Hollice Espy, MD

## 2021-12-01 ENCOUNTER — Other Ambulatory Visit: Payer: BC Managed Care – PPO

## 2021-12-01 DIAGNOSIS — C61 Malignant neoplasm of prostate: Secondary | ICD-10-CM

## 2021-12-02 LAB — PSA: Prostate Specific Ag, Serum: <0.1 ng/mL (ref 0.0–4.0)

## 2021-12-06 ENCOUNTER — Encounter: Payer: Self-pay | Admitting: Urology

## 2021-12-06 ENCOUNTER — Ambulatory Visit: Payer: BC Managed Care – PPO | Admitting: Urology

## 2021-12-06 VITALS — BP 173/90 | HR 64 | Ht 72.0 in | Wt 283.0 lb

## 2021-12-06 DIAGNOSIS — Z8546 Personal history of malignant neoplasm of prostate: Secondary | ICD-10-CM | POA: Diagnosis not present

## 2021-12-06 DIAGNOSIS — N5231 Erectile dysfunction following radical prostatectomy: Secondary | ICD-10-CM

## 2021-12-06 DIAGNOSIS — C61 Malignant neoplasm of prostate: Secondary | ICD-10-CM

## 2021-12-06 NOTE — Progress Notes (Signed)
12/06/2021 2:27 PM   Philip Arnold. 11-25-1960 [id number]  Referring provider:  [name], MD [place],  [place] [id number] Chief Complaint  Patient presents with   Prostate Cancer    1 year follow up     HPI: Philip Arnold. is a 61 y.o.male with a personal history of prostate cancer who presents today for a  1 year follow-up with PSA.   He is s/p radical prostatectomy including lymph node dissection 05/08/2019.  Partial nerve sparing was performed on the left with full nerve sparing on the right.   Surgical pathology pT3a N0 M0.  Gleason score primarily 3+4, with some Gleason 3+5.  Extraprostatic extension was present, nonfocal with presence of bladder neck invasion.  All margins negative including bladder neck margin.  Negative seminal vesicles.  Negative lymph nodes.   His PSA remains undetectable on 12/01/2021.   He is still using the generic Viagra he has intermittent improvement on his erectile dysfunction.  He sometimes gets erections spontaneously without the need for this.  No significant incontinence.   PMH: Past Medical History:  Diagnosis Date   Cancer [facility])    Prostate Cancer   Elevated PSA    GERD (gastroesophageal reflux disease)    Hip pain    History of kidney stones    Hyperglycemia    Hypertension    Obesity     Surgical History: Past Surgical History:  Procedure Laterality Date   COLONOSCOPY WITH PROPOFOL     PELVIC LYMPH NODE DISSECTION Bilateral 04/24/2019   Procedure: PELVIC LYMPH NODE DISSECTION;  Surgeon: Hollice Espy, MD;  Location: [facility] ORS;  Service: Urology;  Laterality: Bilateral;   ROBOT ASSISTED LAPAROSCOPIC RADICAL PROSTATECTOMY N/A 04/24/2019   Procedure: XI ROBOTIC ASSISTED LAPAROSCOPIC RADICAL PROSTATECTOMY;  Surgeon: Hollice Espy, MD;  Location: [facility] ORS;  Service: Urology;  Laterality: N/A;    Home Medications:  Allergies as of 12/06/2021   No Known Allergies      Medication List         Accurate as of December 06, 2021 11:59 PM. If you have any questions, ask your nurse or doctor.          STOP taking these medications    fluticasone 50 MCG/ACT nasal spray Commonly known as: FLONASE Stopped by: Hollice Espy, MD   hydroxypropyl methylcellulose / hypromellose 2.5 % ophthalmic solution Commonly known as: ISOPTO TEARS / GONIOVISC Stopped by: Hollice Espy, MD   omeprazole 20 MG tablet Commonly known as: PRILOSEC OTC Stopped by: Hollice Espy, MD   potassium chloride 10 MEQ tablet Commonly known as: KLOR-CON Stopped by: Hollice Espy, MD       TAKE these medications    amLODipine 10 MG tablet Commonly known as: NORVASC Take 10 mg by mouth daily.   aspirin EC 81 MG tablet Take 81 mg by mouth daily.   metoprolol succinate 100 MG 24 hr tablet Commonly known as: TOPROL-XL Take 100 mg by mouth daily.   montelukast 10 MG tablet Commonly known as: SINGULAIR Take 10 mg by mouth daily.   multivitamin with minerals Tabs tablet Take 1 tablet by mouth daily.   potassium chloride SA 20 MEQ tablet Commonly known as: KLOR-CON M Take 20 mEq by mouth 2 (two) times daily.   sildenafil 20 MG tablet Commonly known as: Revatio Take 1 tablet (20 mg total) by mouth as needed. Take 1-5 tabs as needed prior to intercourse   simvastatin 20 MG tablet Commonly  known as: ZOCOR Take 20 mg by mouth at bedtime.   triamterene-hydrochlorothiazide 37.5-25 MG tablet Commonly known as: MAXZIDE-25 Take 1 tablet by mouth daily.   Vitamin D3 125 MCG (5000 UT) Caps Take 5,000 Units by mouth daily.        Allergies:  No Known Allergies  Family History: Family History  Problem Relation Age of Onset   Bone cancer Mother    Hypertension Father    Stroke Father     Social History:  reports that he has never smoked. He has never used smokeless tobacco. He reports current alcohol use. He reports that he does not use drugs.   Physical Exam: BP (!) 173/90    Pulse 64   Ht 6' (1.829 m)   Wt 283 lb (128.4 kg)   BMI 38.38 kg/m   Constitutional:  Alert and oriented, No acute distress. HEENT: [place] AT, moist mucus membranes.  Trachea midline, no masses. Cardiovascular: No clubbing, cyanosis, or edema. Respiratory: Normal respiratory effort, no increased work of breathing. Skin: No rashes, bruises or suspicious lesions. Neurologic: Grossly intact, no focal deficits, moving all 4 extremities. Psychiatric: Normal mood and affect.  Laboratory Data:  Lab Results  Component Value Date   CREATININE 1.22 04/25/2019   No results found for: "HGBA1C"   Assessment & Plan:   1. Prostate cancer ([place]) -PSA remains undetectable, no evidence of disease - Given his history of bladder neck invasion on pathology, continue to follow closely and consider salvage if his PSA begins to rise.  Recheck PSA in 6 months with lab visit only and then MD visit in 6 months with repeat PSA. - PSA; Future - PSA; Future  2. Erectile dysfunction after radical prostatectomy  -Continue sildenafil   Return in 6 months for PSA oonly and 1 year for office visit  Conley Rolls as a scribe for Hollice Espy, MD.,have documented all relevant documentation on the behalf of Hollice Espy, MD,as directed by  Hollice Espy, MD while in the presence of Hollice Espy, MD.  I have reviewed the above documentation for accuracy and completeness, and I agree with the above.   Hollice Espy, MD   [facility] [address] [place],  [address] [phone]

## 2022-06-05 ENCOUNTER — Other Ambulatory Visit: Payer: BC Managed Care – PPO

## 2022-06-05 DIAGNOSIS — C61 Malignant neoplasm of prostate: Secondary | ICD-10-CM

## 2022-06-06 LAB — PSA: Prostate Specific Ag, Serum: <0.1 ng/mL (ref 0.0–4.0)

## 2022-07-31 ENCOUNTER — Encounter: Payer: Self-pay | Admitting: Emergency Medicine

## 2022-07-31 ENCOUNTER — Emergency Department: Payer: BC Managed Care – PPO

## 2022-07-31 ENCOUNTER — Inpatient Hospital Stay
Admission: EM | Admit: 2022-07-31 | Discharge: 2022-08-02 | DRG: 375 | Disposition: A | Discharge status: Home / Self Care | Payer: BC Managed Care – PPO | Attending: Family Medicine | Admitting: Family Medicine

## 2022-07-31 ENCOUNTER — Other Ambulatory Visit: Payer: Self-pay

## 2022-07-31 DIAGNOSIS — Z6834 Body mass index (BMI) 34.0-34.9, adult: Secondary | ICD-10-CM

## 2022-07-31 DIAGNOSIS — R590 Localized enlarged lymph nodes: Secondary | ICD-10-CM | POA: Diagnosis present

## 2022-07-31 DIAGNOSIS — Z87442 Personal history of urinary calculi: Secondary | ICD-10-CM | POA: Diagnosis not present

## 2022-07-31 DIAGNOSIS — C61 Malignant neoplasm of prostate: Secondary | ICD-10-CM

## 2022-07-31 DIAGNOSIS — Z7982 Long term (current) use of aspirin: Secondary | ICD-10-CM | POA: Diagnosis not present

## 2022-07-31 DIAGNOSIS — C179 Malignant neoplasm of small intestine, unspecified: Principal | ICD-10-CM | POA: Diagnosis present

## 2022-07-31 DIAGNOSIS — C786 Secondary malignant neoplasm of retroperitoneum and peritoneum: Secondary | ICD-10-CM | POA: Diagnosis present

## 2022-07-31 DIAGNOSIS — C799 Secondary malignant neoplasm of unspecified site: Secondary | ICD-10-CM

## 2022-07-31 DIAGNOSIS — Z823 Family history of stroke: Secondary | ICD-10-CM | POA: Diagnosis not present

## 2022-07-31 DIAGNOSIS — Z9079 Acquired absence of other genital organ(s): Secondary | ICD-10-CM | POA: Diagnosis not present

## 2022-07-31 DIAGNOSIS — D72829 Elevated white blood cell count, unspecified: Secondary | ICD-10-CM | POA: Diagnosis present

## 2022-07-31 DIAGNOSIS — K219 Gastro-esophageal reflux disease without esophagitis: Secondary | ICD-10-CM | POA: Diagnosis present

## 2022-07-31 DIAGNOSIS — Z8546 Personal history of malignant neoplasm of prostate: Secondary | ICD-10-CM | POA: Diagnosis not present

## 2022-07-31 DIAGNOSIS — R634 Abnormal weight loss: Secondary | ICD-10-CM | POA: Diagnosis present

## 2022-07-31 DIAGNOSIS — Z809 Family history of malignant neoplasm, unspecified: Secondary | ICD-10-CM

## 2022-07-31 DIAGNOSIS — E785 Hyperlipidemia, unspecified: Secondary | ICD-10-CM | POA: Diagnosis not present

## 2022-07-31 DIAGNOSIS — D509 Iron deficiency anemia, unspecified: Secondary | ICD-10-CM | POA: Diagnosis present

## 2022-07-31 DIAGNOSIS — I10 Essential (primary) hypertension: Secondary | ICD-10-CM | POA: Diagnosis not present

## 2022-07-31 DIAGNOSIS — I700 Atherosclerosis of aorta: Secondary | ICD-10-CM | POA: Diagnosis present

## 2022-07-31 DIAGNOSIS — R5383 Other fatigue: Secondary | ICD-10-CM | POA: Diagnosis present

## 2022-07-31 DIAGNOSIS — D75839 Thrombocytosis, unspecified: Secondary | ICD-10-CM | POA: Diagnosis present

## 2022-07-31 DIAGNOSIS — R188 Other ascites: Secondary | ICD-10-CM | POA: Diagnosis present

## 2022-07-31 DIAGNOSIS — Z8249 Family history of ischemic heart disease and other diseases of the circulatory system: Secondary | ICD-10-CM | POA: Diagnosis not present

## 2022-07-31 DIAGNOSIS — R972 Elevated prostate specific antigen [PSA]: Secondary | ICD-10-CM | POA: Diagnosis present

## 2022-07-31 DIAGNOSIS — C787 Secondary malignant neoplasm of liver and intrahepatic bile duct: Secondary | ICD-10-CM | POA: Diagnosis present

## 2022-07-31 DIAGNOSIS — Z79899 Other long term (current) drug therapy: Secondary | ICD-10-CM | POA: Diagnosis not present

## 2022-07-31 DIAGNOSIS — E669 Obesity, unspecified: Secondary | ICD-10-CM | POA: Diagnosis present

## 2022-07-31 DIAGNOSIS — R7989 Other specified abnormal findings of blood chemistry: Secondary | ICD-10-CM | POA: Diagnosis present

## 2022-07-31 LAB — CBC WITH DIFFERENTIAL/PLATELET
Abs Immature Granulocytes: 0.1 10*3/uL — ABNORMAL HIGH (ref 0.00–0.07)
Basophils Absolute: 0.1 10*3/uL (ref 0.0–0.1)
Basophils Relative: 1 %
Eosinophils Absolute: 0.1 10*3/uL (ref 0.0–0.5)
Eosinophils Relative: 0 %
HCT: 35.6 % — ABNORMAL LOW (ref 39.0–52.0)
Hemoglobin: 10.6 g/dL — ABNORMAL LOW (ref 13.0–17.0)
Immature Granulocytes: 1 %
Lymphocytes Relative: 9 %
Lymphs Abs: 1.5 10*3/uL (ref 0.7–4.0)
MCH: 22.4 pg — ABNORMAL LOW (ref 26.0–34.0)
MCHC: 29.8 g/dL — ABNORMAL LOW (ref 30.0–36.0)
MCV: 75.3 fL — ABNORMAL LOW (ref 80.0–100.0)
Monocytes Absolute: 1.6 10*3/uL — ABNORMAL HIGH (ref 0.1–1.0)
Monocytes Relative: 9 %
Neutro Abs: 13.9 10*3/uL — ABNORMAL HIGH (ref 1.7–7.7)
Neutrophils Relative %: 80 %
Platelets: 748 10*3/uL — ABNORMAL HIGH (ref 150–400)
RBC: 4.73 MIL/uL (ref 4.22–5.81)
RDW: 19.4 % — ABNORMAL HIGH (ref 11.5–15.5)
WBC: 17.2 10*3/uL — ABNORMAL HIGH (ref 4.0–10.5)
nRBC: 0 % (ref 0.0–0.2)

## 2022-07-31 LAB — COMPREHENSIVE METABOLIC PANEL
ALT: 58 U/L — ABNORMAL HIGH (ref 0–44)
AST: 94 U/L — ABNORMAL HIGH (ref 15–41)
Albumin: 2.8 g/dL — ABNORMAL LOW (ref 3.5–5.0)
Alkaline Phosphatase: 310 U/L — ABNORMAL HIGH (ref 38–126)
Anion gap: 11 (ref 5–15)
BUN: 14 mg/dL (ref 8–23)
CO2: 26 mmol/L (ref 22–32)
Calcium: 12.9 mg/dL — ABNORMAL HIGH (ref 8.9–10.3)
Chloride: 94 mmol/L — ABNORMAL LOW (ref 98–111)
Creatinine, Ser: 0.89 mg/dL (ref 0.61–1.24)
GFR, Estimated: >60 mL/min (ref >60)
Glucose, Bld: 104 mg/dL — ABNORMAL HIGH (ref 70–99)
Potassium: 4.1 mmol/L (ref 3.5–5.1)
Sodium: 131 mmol/L — ABNORMAL LOW (ref 135–145)
Total Bilirubin: 2.1 mg/dL — ABNORMAL HIGH (ref 0.3–1.2)
Total Protein: 7.8 g/dL (ref 6.5–8.1)

## 2022-07-31 LAB — PROTIME-INR
INR: 1.2 (ref 0.8–1.2)
Prothrombin Time: 15.3 seconds — ABNORMAL HIGH (ref 11.4–15.2)

## 2022-07-31 LAB — RETICULOCYTES
Immature Retic Fract: 32.8 % — ABNORMAL HIGH (ref 2.3–15.9)
RBC.: 4.39 MIL/uL (ref 4.22–5.81)
Retic Count, Absolute: 111.5 10*3/uL (ref 19.0–186.0)
Retic Ct Pct: 2.5 % (ref 0.4–3.1)

## 2022-07-31 LAB — APTT: aPTT: 29 seconds (ref 24–36)

## 2022-07-31 LAB — VITAMIN B12: Vitamin B-12: 2310 pg/mL — ABNORMAL HIGH (ref 180–914)

## 2022-07-31 LAB — IRON AND TIBC
Iron: 24 ug/dL — ABNORMAL LOW (ref 45–182)
Saturation Ratios: 12 % — ABNORMAL LOW (ref 17.9–39.5)
TIBC: 202 ug/dL — ABNORMAL LOW (ref 250–450)
UIBC: 178 ug/dL

## 2022-07-31 LAB — FOLATE: Folate: 15.5 ng/mL (ref >5.9)

## 2022-07-31 LAB — FERRITIN: Ferritin: 103 ng/mL (ref 24–336)

## 2022-07-31 LAB — HIV ANTIBODY (ROUTINE TESTING W REFLEX): HIV Screen 4th Generation wRfx: NONREACTIVE

## 2022-07-31 MED ORDER — ALBUTEROL SULFATE (2.5 MG/3ML) 0.083% IN NEBU: 3.0000 mL | INHALATION_SOLUTION | RESPIRATORY_TRACT | Status: DC | PRN

## 2022-07-31 MED ORDER — VITAMIN D 25 MCG (1000 UNIT) PO TABS: 5000.0000 [IU] | ORAL_TABLET | Freq: Every day | ORAL | Status: DC

## 2022-07-31 MED ORDER — IOHEXOL 300 MG/ML  SOLN
100.0000 mL | Freq: Once | INTRAMUSCULAR | Status: AC | PRN
  Administered 2022-07-31: 100 mL via INTRAVENOUS

## 2022-07-31 MED ORDER — SIMVASTATIN 20 MG PO TABS
20.0000 mg | ORAL_TABLET | Freq: Every day | ORAL | Status: DC
  Administered 2022-07-31 – 2022-08-01 (×2): 20 mg via ORAL
  Filled 2022-07-31: qty 2
  Filled 2022-07-31: qty 1

## 2022-07-31 MED ORDER — FERROUS SULFATE 325 (65 FE) MG PO TABS
325.0000 mg | ORAL_TABLET | Freq: Every day | ORAL | Status: DC
  Administered 2022-07-31 – 2022-08-02 (×3): 325 mg via ORAL
  Filled 2022-07-31 (×3): qty 1

## 2022-07-31 MED ORDER — SIMVASTATIN 10 MG PO TABS: 20.0000 mg | ORAL_TABLET | Freq: Every day | ORAL | Status: DC

## 2022-07-31 MED ORDER — FERROUS SULFATE 325 (65 FE) MG PO TABS: 325.0000 mg | ORAL_TABLET | Freq: Every day | ORAL | Status: DC

## 2022-07-31 MED ORDER — SODIUM CHLORIDE 0.9 % IV BOLUS
1000.0000 mL | Freq: Once | INTRAVENOUS | Status: AC
  Administered 2022-07-31: 1000 mL via INTRAVENOUS

## 2022-07-31 MED ORDER — PANTOPRAZOLE SODIUM 40 MG PO TBEC: 40.0000 mg | DELAYED_RELEASE_TABLET | Freq: Every day | ORAL | Status: DC

## 2022-07-31 MED ORDER — AMLODIPINE BESYLATE 10 MG PO TABS
10.0000 mg | ORAL_TABLET | Freq: Every day | ORAL | Status: DC
  Administered 2022-07-31 – 2022-08-02 (×3): 10 mg via ORAL
  Filled 2022-07-31: qty 2
  Filled 2022-07-31 (×2): qty 1

## 2022-07-31 MED ORDER — PANTOPRAZOLE SODIUM 40 MG PO TBEC
40.0000 mg | DELAYED_RELEASE_TABLET | Freq: Every day | ORAL | Status: DC
  Administered 2022-07-31 – 2022-08-02 (×3): 40 mg via ORAL
  Filled 2022-07-31 (×3): qty 1

## 2022-07-31 MED ORDER — METOPROLOL SUCCINATE ER 50 MG PO TB24
100.0000 mg | ORAL_TABLET | Freq: Every day | ORAL | Status: DC
  Administered 2022-07-31 – 2022-08-02 (×3): 100 mg via ORAL
  Filled 2022-07-31 (×3): qty 2

## 2022-07-31 MED ORDER — ONDANSETRON HCL 4 MG/2ML IJ SOLN: 4.0000 mg | Freq: Three times a day (TID) | INTRAMUSCULAR | Status: DC | PRN

## 2022-07-31 MED ORDER — HEPARIN SODIUM (PORCINE) 5000 UNIT/ML IJ SOLN
5000.0000 [IU] | Freq: Three times a day (TID) | INTRAMUSCULAR | Status: DC
  Administered 2022-07-31 – 2022-08-02 (×4): 5000 [IU] via SUBCUTANEOUS
  Filled 2022-07-31 (×5): qty 1

## 2022-07-31 MED ORDER — SODIUM CHLORIDE 0.9 % IV SOLN: INTRAVENOUS | Status: DC

## 2022-07-31 MED ORDER — ASPIRIN 81 MG PO TBEC: 81.0000 mg | DELAYED_RELEASE_TABLET | Freq: Every day | ORAL | Status: DC

## 2022-07-31 MED ORDER — SODIUM CHLORIDE 0.9 % IV SOLN
90.0000 mg | Freq: Once | INTRAVENOUS | Status: AC
  Administered 2022-07-31: 90 mg via INTRAVENOUS
  Filled 2022-07-31: qty 10

## 2022-07-31 MED ORDER — FLUTICASONE PROPIONATE 50 MCG/ACT NA SUSP: 1.0000 | Freq: Every day | NASAL | Status: DC | PRN

## 2022-07-31 MED ORDER — METOPROLOL SUCCINATE ER 50 MG PO TB24: 100.0000 mg | ORAL_TABLET | Freq: Every day | ORAL | Status: DC

## 2022-07-31 MED ORDER — HYDRALAZINE HCL 20 MG/ML IJ SOLN: 5.0000 mg | INTRAMUSCULAR | Status: DC | PRN

## 2022-07-31 MED ORDER — VITAMIN D 25 MCG (1000 UNIT) PO TABS
5000.0000 [IU] | ORAL_TABLET | Freq: Every day | ORAL | Status: DC
  Administered 2022-07-31 – 2022-08-02 (×3): 5000 [IU] via ORAL
  Filled 2022-07-31 (×3): qty 5

## 2022-07-31 MED ORDER — ASPIRIN 81 MG PO TBEC
81.0000 mg | DELAYED_RELEASE_TABLET | Freq: Every day | ORAL | Status: DC
  Administered 2022-07-31: 81 mg via ORAL
  Filled 2022-07-31: qty 1

## 2022-07-31 MED ORDER — AMLODIPINE BESYLATE 5 MG PO TABS: 10.0000 mg | ORAL_TABLET | Freq: Every day | ORAL | Status: DC

## 2022-07-31 MED ORDER — ADULT MULTIVITAMIN W/MINERALS CH: 1.0000 | ORAL_TABLET | Freq: Every day | ORAL | Status: DC

## 2022-07-31 MED ORDER — ADULT MULTIVITAMIN W/MINERALS CH
1.0000 | ORAL_TABLET | Freq: Every day | ORAL | Status: DC
  Administered 2022-07-31 – 2022-08-02 (×3): 1 via ORAL
  Filled 2022-07-31 (×3): qty 1

## 2022-07-31 NOTE — Progress Notes (Signed)
Patient arrived to the unit from ER in stable condition.Alert and oriented, but tearful.  Patient states there are "a lot of things going on" in his family right now.  Patient expresses being retired from [employer] and married for "20 some" years.

## 2022-07-31 NOTE — ED Triage Notes (Signed)
Patient sent by PCP for abnormal labs. Seen at PCP for recent 30 lb weight loss. Blood work showed an increased calcium level, increased liver enzymes, and increased WBC.

## 2022-07-31 NOTE — H&P (Signed)
History and Physical    Kelby Aline. RO:[id number] DOB: July 20, 1960 DOA: 07/31/2022  Referring MD/NP/PA:   PCP: [name], MD   Patient coming from:  The patient is coming from home.  At baseline, pt is independent for most of ADL.        Chief Complaint: Abnormal lab, weight loss  HPI: Philip Arnold. is a 62 y.o. male with medical history significant of prostate cancer (s/p for prostatectomy), hypertension, hyperlipidemia, kidney stone, who presents with abnormal lab and weight loss.    Patient states that he has generalized weakness and fatigue recently.  He has 30-40 pounds unintentional weight loss recently.  Patient denies nausea, vomiting, abdominal pain.  He has mild SOB, no cough or chest pain.  No fever or chills.  No symptoms of UTI.  Patient was seen by PCP and found to have abnormal lab with hypercalcemia, abnormal liver function and leukocytosis.  Patient is sent to ED for further evaluation and treatment.   Data reviewed independently and ED Course: pt was found to have WBC 17.2, calcium 12.9, potassium 4.1, temperature normal, blood pressure 164/88, heart rate 81, RR 25, oxygen saturation 96% on room air.  Patient is admitted to [place] bed as inpatient.  Dr. Grayland Ormond of oncology is consulted  CT of the abdomen/pelvis: 1. Widespread metastatic disease, including to liver, abdominal nodes, peritoneum, and likely the left pleural space. 2. 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 3. Small bibasilar pulmonary nodules, nonspecific but mildly suspicious for metastatic disease. 4. Small volume abdominopelvic ascites. 5. Prostatectomy, without local recurrence. Distribution of metastatic disease felt unlikely to represent prostate. 6. Indeterminate left adrenal nodule. 7.  Aortic Atherosclerosis (ICD10-I70.0).   EKG: I have personally reviewed.  Sinus rhythm, QTc 309,  LAD, poor R wave progression   Review of Systems:   General: no fevers, chills, has body weight loss, has fatigue HEENT: no blurry vision, hearing changes or sore throat Respiratory: has mold dyspnea, no coughing, wheezing CV: no chest pain, no palpitations GI: no nausea, vomiting, abdominal pain, diarrhea, constipation GU: no dysuria, burning on urination, increased urinary frequency, hematuria  Ext: no leg edema Neuro: no unilateral weakness, numbness, or tingling, no vision change or hearing loss Skin: no rash, no skin tear. MSK: No muscle spasm, no deformity, no limitation of range of movement in spin Heme: No easy bruising.  Travel history: No recent long distant travel.   Allergy: No Known Allergies  Past Medical History:  Diagnosis Date   Cancer ([place])    Prostate Cancer   Elevated PSA    GERD (gastroesophageal reflux disease)    Hip pain    History of kidney stones    Hyperglycemia    Hypertension    Obesity     Past Surgical History:  Procedure Laterality Date   COLONOSCOPY WITH PROPOFOL     PELVIC LYMPH NODE DISSECTION Bilateral 04/24/2019   Procedure: PELVIC LYMPH NODE DISSECTION;  Surgeon: Hollice Espy, MD;  Location: [facility] ORS;  Service: Urology;  Laterality: Bilateral;   ROBOT ASSISTED LAPAROSCOPIC RADICAL PROSTATECTOMY N/A 04/24/2019   Procedure: XI ROBOTIC ASSISTED LAPAROSCOPIC RADICAL PROSTATECTOMY;  Surgeon: Hollice Espy, MD;  Location: [facility] ORS;  Service: Urology;  Laterality: N/A;    Social History:  reports that he has never smoked. He has never used smokeless tobacco. He reports current alcohol use. He reports that he does not use drugs.  Family History:  Family History  Problem Relation Age of Onset   Bone cancer Mother    Hypertension Father    Stroke Father      Prior to Admission medications   Medication Sig Start Date End Date Taking? Authorizing Provider  amLODipine (NORVASC) 10 MG tablet Take 10 mg by mouth daily.  09/03/17    [provider]  aspirin EC 81 MG tablet Take 81 mg by mouth daily.  06/05/17   [provider]  Cholecalciferol (VITAMIN D3) 125 MCG (5000 UT) CAPS Take 5,000 Units by mouth daily.     [provider]  metoprolol succinate (TOPROL-XL) 100 MG 24 hr tablet Take 100 mg by mouth daily.  09/03/17   [provider]  montelukast (SINGULAIR) 10 MG tablet Take 10 mg by mouth daily. 11/08/21   [provider]  Multiple Vitamin (MULTIVITAMIN WITH MINERALS) TABS tablet Take 1 tablet by mouth daily.    [provider]  potassium chloride SA (KLOR-CON M) 20 MEQ tablet Take 20 mEq by mouth 2 (two) times daily. 10/20/21   [provider]  sildenafil (REVATIO) 20 MG tablet Take 1 tablet (20 mg total) by mouth as needed. Take 1-5 tabs as needed prior to intercourse 12/06/20   Hollice Espy, MD  simvastatin (ZOCOR) 20 MG tablet Take 20 mg by mouth at bedtime.  09/03/17   [provider]  triamterene-hydrochlorothiazide (MAXZIDE-25) 37.5-25 MG tablet Take 1 tablet by mouth daily.  09/03/17   [provider]    Physical Exam: Vitals:   07/31/22 1100 07/31/22 1130 07/31/22 1350 07/31/22 1353  BP: (!) 164/88 (!) 159/83 (!) 159/80   Pulse: 81 78 89   Resp: (!) 25 17 (!) 23   Temp:    98.5 F (36.9 C)  TempSrc:    Oral  SpO2: 96% 99% 96%   Weight:      Height:       General: Not in acute distress HEENT:       Eyes: PERRL, EOMI, no scleral icterus.       ENT: No discharge from the ears and nose, no pharynx injection, no tonsillar enlargement.        Neck: No JVD, no bruit, no mass felt. Heme: No neck lymph node enlargement. Cardiac: S1/S2, RRR, No murmurs, No gallops or rubs. Respiratory: No rales, wheezing, rhonchi or rubs. GI: Soft, nondistended, nontender, no rebound pain, no organomegaly, BS present. GU: No hematuria Ext: No pitting leg edema bilaterally. 1+DP/PT pulse bilaterally. Musculoskeletal: No joint deformities, No  joint redness or warmth, no limitation of ROM in spin. Skin: No rashes.  Neuro: Alert, oriented X3, cranial nerves II-XII grossly intact, moves all extremities normally.  Psych: Patient is not psychotic, no suicidal or hemocidal ideation.  Labs on Admission: I have personally reviewed following labs and imaging studies  CBC: Recent Labs  Lab 07/31/22 0900  WBC 17.2*  NEUTROABS 13.9*  HGB 10.6*  HCT 35.6*  MCV 75.3*  PLT [id number]*   Basic Metabolic Panel: Recent Labs  Lab 07/31/22 0906  NA 131*  K 4.1  CL 94*  CO2 26  GLUCOSE 104*  BUN 14  CREATININE 0.89  CALCIUM 12.9*   GFR: Estimated Creatinine Clearance: 109.8 mL/min (by C-G formula based on SCr of 0.89 mg/dL). Liver Function Tests: Recent Labs  Lab 07/31/22 0906  AST 94*  ALT 58*  ALKPHOS 310*  BILITOT 2.1*  PROT 7.8  ALBUMIN 2.8*   No results for input(s): "LIPASE", "AMYLASE" in  the last 168 hours. No results for input(s): "AMMONIA" in the last 168 hours. Coagulation Profile: Recent Labs  Lab 07/31/22 1302  INR 1.2   Cardiac Enzymes: No results for input(s): "CKTOTAL", "CKMB", "CKMBINDEX", "TROPONINI" in the last 168 hours. BNP (last 3 results) No results for input(s): "PROBNP" in the last 8760 hours. HbA1C: No results for input(s): "HGBA1C" in the last 72 hours. CBG: No results for input(s): "GLUCAP" in the last 168 hours. Lipid Profile: No results for input(s): "CHOL", "HDL", "LDLCALC", "TRIG", "CHOLHDL", "LDLDIRECT" in the last 72 hours. Thyroid Function Tests: No results for input(s): "TSH", "T4TOTAL", "FREET4", "T3FREE", "THYROIDAB" in the last 72 hours. Anemia Panel: Recent Labs    07/31/22 1300  RETICCTPCT 2.5   Urine analysis:    Component Value Date/Time   COLORURINE YELLOW 12/22/2017 1449   APPEARANCEUR Clear 04/15/2019 1101   LABSPEC 1.020 12/22/2017 1449   PHURINE 6.0 12/22/2017 1449   GLUCOSEU Negative 04/15/2019 1101   HGBUR MODERATE (A) 12/22/2017 1449   BILIRUBINUR  Negative 04/15/2019 1101   KETONESUR NEGATIVE 12/22/2017 1449   PROTEINUR Negative 04/15/2019 1101   PROTEINUR NEGATIVE 12/22/2017 1449   NITRITE Negative 04/15/2019 1101   NITRITE NEGATIVE 12/22/2017 1449   LEUKOCYTESUR Negative 04/15/2019 1101   Sepsis Labs: @LABRCNTIP$ (procalcitonin:4,lacticidven:4) )No results found for this or any previous visit (from the past 240 hour(s)).   Radiological Exams on Admission: CT ABDOMEN PELVIS W CONTRAST  Result Date: 07/31/2022 CLINICAL DATA:  30 pound weight loss. Increased calcium level. Increased liver enzymes. Elevated white blood cell count. Prostatectomy 4 years ago. * Tracking Code: BO * EXAM: CT ABDOMEN AND PELVIS WITH CONTRAST TECHNIQUE: Multidetector CT imaging of the abdomen and pelvis was performed using the standard protocol following bolus administration of intravenous contrast. RADIATION DOSE REDUCTION: This exam was performed according to the departmental dose-optimization program which includes automated exposure control, adjustment of the mA and/or kV according to patient size and/or use of iterative reconstruction technique. CONTRAST:  152m OMNIPAQUE IOHEXOL 300 MG/ML  SOLN COMPARISON:  05/29/2022 chest radiograph.  Prostate MRI 03/06/2019. FINDINGS: Lower chest: Bibasilar pulmonary nodules. Example right middle lobe 5 mm nodule on 08/04. Normal heart size without pericardial or pleural effusion. Areas of left-sided pleural-based nodularity versus subpleural lymph nodes. Example 8 mm on 06/02. Hepatobiliary: Innumerable, bilateral liver masses consistent with metastasis. Example posterior right hepatic lobe (segment 7) 3.7 x 3.0 cm on 21/2. Central anterior segment 2 mass measures 3.4 x 3.2 cm on 20/2. No calcified gallstone or biliary duct dilatation. Pancreas: Normal, without mass or ductal dilatation. Spleen: Normal in size, without focal abnormality. Adrenals/Urinary Tract: Normal right adrenal gland. Mild left adrenal thickening and  nodularity, including at up to 1.0 cm on 25/2. Interpolar left renal 5.7 cm fluid density lesion is likely a cyst . In the absence of clinically indicated signs/symptoms require(s) no independent follow-up. Normal right kidney. No hydronephrosis. Normal urinary bladder. Stomach/Bowel: Proximal gastric underdistention. Scattered colonic diverticula. Normal terminal ileum and appendix. A loop of markedly thickened jejunum is identified on 57/2 and coronal image 57. Vascular/Lymphatic: Aortic atherosclerosis. Porta hepatis adenopathy including a portacaval 1.6 cm node on 36/2. Small bowel mesenteric adenopathy, centered about the thickened small bowel loop. Example nodes at up to 2.6 x 2.4 cm on 55/2. No pelvic sidewall adenopathy. Reproductive: Prostatectomy, without local recurrence. Other: Small volume abdominopelvic ascites. Extensive peritoneal metastasis. Example left omental implant of 4.2 x 4.5 cm on 47/2. No free intraperitoneal air. Tiny fat containing right inguinal hernia. Small  fat containing paraumbilical hernia. Mild right-sided gynecomastia. Musculoskeletal: Presumably degenerative partial fusion of the bilateral sacroiliac joints. Lumbosacral spondylosis. IMPRESSION: 1. Widespread metastatic disease, including to liver, abdominal nodes, peritoneum, and likely the left pleural space. 2. 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 3. Small bibasilar pulmonary nodules, nonspecific but mildly suspicious for metastatic disease. 4. Small volume abdominopelvic ascites. 5. Prostatectomy, without local recurrence. Distribution of metastatic disease felt unlikely to represent prostate. 6. Indeterminate left adrenal nodule. 7.  Aortic Atherosclerosis (ICD10-I70.0). Electronically Signed   By: Abigail Miyamoto M.D.   On: 07/31/2022 11:38      Assessment/Plan Principal Problem:   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HCC) Active Problems:    Hypercalcemia   Hypertension   HLD (hyperlipidemia)   Abnormal LFTs   Leukocytosis   Microcytic anemia   Prostate cancer ([facility])   Obesity (BMI 30-39.9)   Assessment and Plan:   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[place]): CT showed widespread metastatic disease, including to liver, abdominal nodes, peritoneum, and likely the left pleural space, favored primary is small-bowel adenocarcinoma, as evidenced by a markedly thickened loop of jejunum with localized adenopathy. Consulted Dr. Grayland Ormond of oncology --> plan to do US-biopsy of liver  -will admit to MedSurg bed as inpatient -prn zofran -inr/ptt -f/u Dr. Gary Fleet recommendation  Hypercalcemia: Calcium 12.9, most likely due to cancer -Check PTH, vitamin D1,25 level and PTH-related peptid -IVF: 1L of NS, then 125 cc/h -give 90 mg of pamidronate IV  Hypertension -IV hydralazine as needed -Hold Maxide -Amlodipine, metoprolol  HLD (hyperlipidemia) -Zocor  Abnormal LFTs: Likely due to metastasized to liver disease.  ALP 310, AST 94, ALT 58, total bilirubin 2.1 -Avoid using Tylenol  Leukocytosis: WBC 17.2.  No source of infection identified.  Likely reactive -Follow-up with CBC  Microcytic anemia: Hemoglobin 10.6 -Continue iron supplement  Prostate cancer [facility]): S/p of prostatectomy.  Recent PSA 0.01 on 07/30/2022  Obesity (BMI 30-39.9): Body weight 112.5 kg, BMI 34.59 -Exercise and healthy diet -Encourage to lose weight    DVT ppx: SQ Heparin    Code Status: Full code  Family Communication:  Yes, patient's wife   at bed side.     Disposition Plan:  Anticipate discharge back to previous environment  Consults called:  Dr. Grayland Ormond of oncology  Admission status and Level of care: Med-Surg:    as inpt       Dispo: The patient is from: Home              Anticipated d/c is to: Home              Anticipated d/c date is: 2 days              Patient currently is not medically stable to d/c.    Severity of Illness:  The  appropriate patient status for this patient is INPATIENT. Inpatient status is judged to be reasonable and necessary in order to provide the required intensity of service to ensure the patient's safety. The patient's presenting symptoms, physical exam findings, and initial radiographic and laboratory data in the context of their chronic comorbidities is felt to place them at high risk for further clinical deterioration. Furthermore, it is not anticipated that the patient will be medically stable for discharge from the hospital within 2 midnights of admission.   * I certify that at the point of admission it is my clinical judgment that the patient will require inpatient hospital  care spanning beyond 2 midnights from the point of admission due to high intensity of service, high risk for further deterioration and high frequency of surveillance required.*       Date of Service 07/31/2022    Ivor Costa [facility]   If 7PM-7AM, please contact night-coverage [email or web address] 07/31/2022, 2:24 PM

## 2022-07-31 NOTE — ED Provider Notes (Signed)
[facility] Provider Note    Event Date/Time   First MD Initiated Contact with Patient 07/31/22 [phone]     (approximate)   History   Abnormal Labs   HPI  Philip Arnold. is a 62 y.o. male  who presents to the emergency department today at the advice of PCP because of abnormal blood work performed yesterday. Says white blood cell count was elevated. Patient went to his PCP because of concern for weight loss. Family states he has lost around 40 pounds in 1-2 months. Says he only has some discomfort in his upper abdomen but attributes that to a cough he has had for roughly 6 months. He has also had decreased energy levels. Patient has history of prostate cancer s/p prostatectomy but says he gets frequent PSA levels checked and they have been good.       Physical Exam   Triage Vital Signs: ED Triage Vitals  Enc Vitals Group     BP 07/31/22 0902 122/89     Pulse Rate 07/31/22 0902 90     Resp 07/31/22 0902 18     Temp 07/31/22 0902 98.3 F (36.8 C)     Temp Source 07/31/22 0902 Oral     SpO2 07/31/22 0902 95 %     Weight 07/31/22 0854 248 lb (112.5 kg)     Height 07/31/22 0854 5' 11"$  (1.803 m)     Head Circumference --      Peak Flow --      Pain Score 07/31/22 0853 0     Pain Loc --      Pain Edu? --      Excl. in [place]? --     Most recent vital signs: Vitals:   07/31/22 0902  BP: 122/89  Pulse: 90  Resp: 18  Temp: 98.3 F (36.8 C)  SpO2: 95%   General: Awake, alert, oriented. CV:  Good peripheral perfusion. Regular rate and rhythm. Resp:  Normal effort. Lungs clear. Abd:  No distention. Minimal tenderness in upper abdomen.    ED Results / Procedures / Treatments   Labs (all labs ordered are listed, but only abnormal results are displayed) Labs Reviewed  CBC WITH DIFFERENTIAL/PLATELET - Abnormal; Notable for the following components:      Result Value   WBC 17.2 (*)    Hemoglobin 10.6 (*)    HCT 35.6 (*)    MCV 75.3 (*)     MCH 22.4 (*)    MCHC 29.8 (*)    RDW 19.4 (*)    Platelets 748 (*)    Neutro Abs 13.9 (*)    Monocytes Absolute 1.6 (*)    Abs Immature Granulocytes 0.10 (*)    All other components within normal limits  COMPREHENSIVE METABOLIC PANEL - Abnormal; Notable for the following components:   Sodium 131 (*)    Chloride 94 (*)    Glucose, Bld 104 (*)    Calcium 12.9 (*)    Albumin 2.8 (*)    AST 94 (*)    ALT 58 (*)    Alkaline Phosphatase 310 (*)    Total Bilirubin 2.1 (*)    All other components within normal limits  PROTIME-INR  APTT  PARATHYROID HORMONE, INTACT (NO CA)  PTH-RELATED PEPTIDE  CALCITRIOL (1,25 DI-OH VIT D)  VITAMIN B12  FOLATE  IRON AND TIBC  FERRITIN  RETICULOCYTES  HIV ANTIBODY (ROUTINE TESTING W REFLEX)     EKG  I, Nance Pear, attending  physician, personally viewed and interpreted this EKG  EKG Time: 0901 Rate: 93 Rhythm: sinus rhythm Axis: left axis deviation Intervals: qtc 398 QRS: narrow, q waves v1 ST changes: no st elevation Impression: abnormal ekg  RADIOLOGY I independently interpreted and visualized the CT abd/pel. My interpretation: Multiple hepatic lesions Radiology interpretation:  IMPRESSION:  1. Widespread metastatic disease, including to liver, abdominal  nodes, peritoneum, and likely the left pleural space.  2. 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  3. Small bibasilar pulmonary nodules, nonspecific but mildly  suspicious for metastatic disease.  4. Small volume abdominopelvic ascites.  5. Prostatectomy, without local recurrence. Distribution of  metastatic disease felt unlikely to represent prostate.  6. Indeterminate left adrenal nodule.  7.  Aortic Atherosclerosis (ICD10-I70.0).      PROCEDURES:  Critical Care performed: No  Procedures   MEDICATIONS ORDERED IN ED: Medications - No data to display   IMPRESSION / MDM /  [place] / ED COURSE  I reviewed the triage vital signs and the nursing notes.                              Differential diagnosis includes, but is not limited to, cancer, lab error, thyroid  Patient's presentation is most consistent with acute presentation with potential threat to life or bodily function.  Patient presented to the emergency department today after blood work ordered from PCPs office yesterday was concerning for hypercalcemia and elevated LFTs.  On exam patient does have some slight abdominal discomfort.  Blood work here confirms elevated LFTs and hypercalcemia.  He was started on IV fluids.  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  Discussed this finding with the patient.  Discussed with Dr. Blaine Hamper with the hospitalist service who will plan on admission.   FINAL CLINICAL IMPRESSION(S) / ED DIAGNOSES   Final diagnoses:  Hypercalcemia  Weight loss  Metastatic malignant neoplasm, unspecified site [facility])     Note:  This document was prepared using Dragon voice recognition software and may include unintentional dictation errors.    Nance Pear, MD 07/31/22 [phone]

## 2022-07-31 NOTE — Plan of Care (Signed)
  Problem: Education: Goal: Knowledge of General Education information will improve Description: Including pain rating scale, medication(s)/side effects and non-pharmacologic comfort measures Outcome: Progressing   Problem: Health Behavior/Discharge Planning: Goal: Ability to manage health-related needs will improve Outcome: Progressing   Problem: Clinical Measurements: Goal: Ability to maintain clinical measurements within normal limits will improve Outcome: Progressing Goal: Respiratory complications will improve Outcome: Progressing   Problem: Nutrition: Goal: Adequate nutrition will be maintained Outcome: Progressing   Problem: Coping: Goal: Level of anxiety will decrease Outcome: Progressing   Problem: Elimination: Goal: Will not experience complications related to bowel motility Outcome: Progressing Goal: Will not experience complications related to urinary retention Outcome: Progressing

## 2022-08-01 DIAGNOSIS — C179 Malignant neoplasm of small intestine, unspecified: Secondary | ICD-10-CM | POA: Diagnosis not present

## 2022-08-01 LAB — CBC
HCT: 32 % — ABNORMAL LOW (ref 39.0–52.0)
Hemoglobin: 9.7 g/dL — ABNORMAL LOW (ref 13.0–17.0)
MCH: 22.6 pg — ABNORMAL LOW (ref 26.0–34.0)
MCHC: 30.3 g/dL (ref 30.0–36.0)
MCV: 74.6 fL — ABNORMAL LOW (ref 80.0–100.0)
Platelets: 645 10*3/uL — ABNORMAL HIGH (ref 150–400)
RBC: 4.29 MIL/uL (ref 4.22–5.81)
RDW: 19.8 % — ABNORMAL HIGH (ref 11.5–15.5)
WBC: 14.3 10*3/uL — ABNORMAL HIGH (ref 4.0–10.5)
nRBC: 0 % (ref 0.0–0.2)

## 2022-08-01 LAB — COMPREHENSIVE METABOLIC PANEL
ALT: 50 U/L — ABNORMAL HIGH (ref 0–44)
AST: 89 U/L — ABNORMAL HIGH (ref 15–41)
Albumin: 2.4 g/dL — ABNORMAL LOW (ref 3.5–5.0)
Alkaline Phosphatase: 287 U/L — ABNORMAL HIGH (ref 38–126)
Anion gap: 6 (ref 5–15)
BUN: 15 mg/dL (ref 8–23)
CO2: 28 mmol/L (ref 22–32)
Calcium: 11.1 mg/dL — ABNORMAL HIGH (ref 8.9–10.3)
Chloride: 99 mmol/L (ref 98–111)
Creatinine, Ser: 0.87 mg/dL (ref 0.61–1.24)
GFR, Estimated: >60 mL/min (ref >60)
Glucose, Bld: 99 mg/dL (ref 70–99)
Potassium: 4.1 mmol/L (ref 3.5–5.1)
Sodium: 133 mmol/L — ABNORMAL LOW (ref 135–145)
Total Bilirubin: 2 mg/dL — ABNORMAL HIGH (ref 0.3–1.2)
Total Protein: 6.7 g/dL (ref 6.5–8.1)

## 2022-08-01 LAB — PARATHYROID HORMONE, INTACT (NO CA): PTH: 6 pg/mL — ABNORMAL LOW (ref 15–65)

## 2022-08-01 LAB — CALCITRIOL (1,25 DI-OH VIT D): Vit D, 1,25-Dihydroxy: 92 pg/mL — ABNORMAL HIGH (ref 24.8–81.5)

## 2022-08-01 MED ORDER — ENSURE ENLIVE PO LIQD
237.0000 mL | Freq: Two times a day (BID) | ORAL | Status: DC
  Administered 2022-08-01: 237 mL via ORAL

## 2022-08-01 NOTE — Progress Notes (Signed)
Initial Nutrition Assessment  DOCUMENTATION CODES:   Obesity unspecified  INTERVENTION:   -Ensure Enlive po BID, each supplement provides 350 kcal and 20 grams of protein -MVI with minerals daily  NUTRITION DIAGNOSIS:   Increased nutrient needs related to cancer and cancer related treatments as evidenced by estimated needs.  GOAL:   Patient will meet greater than or equal to 90% of their needs  MONITOR:   PO intake, Supplement acceptance  REASON FOR ASSESSMENT:   Malnutrition Screening Tool    ASSESSMENT:   Pt with PMH significant for prostate cancer s/p prostatectomy, hypertension, hyperlipidemia, kidney stones presented with abnormal labs and weight loss.  Pt admitted with unintentional weight loss and carcinoma of small bowel.   Reviewed I/O's: +2.5 L x 24 hours  Per IR notes, plan for outpatient liver biopsy on 08/06/22.   Per MD notes, CT of abdomen and pelvis revealed widespread metastatic disease to liver, abdominal nodes, and peritoneum.   Per H&P, pt reports 30-40# wt loss. Reviewed wt hx; pt has experienced a 12.4% wt loss over the past 8 months.   Unable to get good history from pt. Pt in with RN and MD during times of attempted visits.   Pt currently on a regular diet. Noted meal completions 60%. Noted lunch tray was 100% completed.   Medications reviewed and include vitamin D3 and 0.9% sodium chloride infusion @ 125 ml/hr.   Labs reviewed.   NUTRITION - FOCUSED PHYSICAL EXAM:  Flowsheet Row Most Recent Value  Orbital Region No depletion  Upper Arm Region No depletion  Thoracic and Lumbar Region No depletion  Buccal Region No depletion  Temple Region No depletion  Clavicle Bone Region No depletion  Clavicle and Acromion Bone Region No depletion  Scapular Bone Region No depletion  Dorsal Hand No depletion  Patellar Region No depletion  Anterior Thigh Region No depletion  Posterior Calf Region No depletion  Edema (RD Assessment) None  Hair  Reviewed  Eyes Reviewed  Mouth Reviewed  Skin Reviewed  Nails Reviewed       Diet Order:   Diet Order             Diet regular Room service appropriate? Yes; Fluid consistency: Thin  Diet effective now                   EDUCATION NEEDS:   No education needs have been identified at this time  Skin:  Skin Assessment: Reviewed RN Assessment  Last BM:  08/01/22  Height:   Ht Readings from Last 1 Encounters:  07/31/22 5' 11"$  (1.803 m)    Weight:   Wt Readings from Last 1 Encounters:  07/31/22 112.5 kg    Ideal Body Weight:  78.2 kg  BMI:  Body mass index is 34.59 kg/m.  Estimated Nutritional Needs:   Kcal:  [id number]  Protein:  120-135 grams  Fluid:  > 2 L    Loistine Chance, RD, LDN, [place] Registered Dietitian II Certified Diabetes Care and Education Specialist Please refer to [facility] for RD and/or RD on-call/weekend/after hours pager

## 2022-08-01 NOTE — Consult Note (Signed)
[facility]  [phone] [phone]  ID: Philip Arnold. OB: [birth year]  MR#: [id number]  [id number]  Patient Care Team: [name], MD as PCP - General (Internal Medicine)  CHIEF COMPLAINT: Innumerable liver lesions, highly concerning for underlying malignancy.  INTERVAL HISTORY: Patient is a 62 year old male who was in his usual state of health until 1 or 2 months ago when he started having increased weakness and fatigue and unintentional weight loss.  Upon presentation to the emergency room, CT scan of the abdomen revealed innumerable liver metastasis as well as small bowel thickening highly concerning for malignancy, possibly small bowel/GI in origin.  He has no neurologic complaints.  He denies any recent fevers or illnesses.  He has a good appetite and denies weight loss.  He has no chest pain, shortness of breath, cough, or hemoptysis.  He denies any nausea, vomiting, constipation, or diarrhea.  He has no melena or hematochezia.  He has no urinary complaints.  Patient offers no further specific complaints today.  REVIEW OF SYSTEMS:   Review of Systems  Constitutional:  Positive for malaise/fatigue and weight loss. Negative for fever.  Respiratory: Negative.  Negative for cough, hemoptysis and shortness of breath.   Cardiovascular: Negative.  Negative for chest pain and leg swelling.  Gastrointestinal: Negative.  Negative for abdominal pain, blood in stool, constipation and melena.  Genitourinary: Negative.  Negative for frequency.  Musculoskeletal: Negative.  Negative for back pain.  Skin: Negative.  Negative for rash.  Neurological:  Positive for weakness. Negative for dizziness, focal weakness and headaches.  Psychiatric/Behavioral: Negative.  The patient is not nervous/anxious.     As per HPI. Otherwise, a complete review of systems is negative.  PAST MEDICAL HISTORY: Past Medical History:  Diagnosis Date   Cancer [facility])     Prostate Cancer   Elevated PSA    GERD (gastroesophageal reflux disease)    Hip pain    History of kidney stones    Hyperglycemia    Hypertension    Obesity     PAST SURGICAL HISTORY: Past Surgical History:  Procedure Laterality Date   COLONOSCOPY WITH PROPOFOL     PELVIC LYMPH NODE DISSECTION Bilateral 04/24/2019   Procedure: PELVIC LYMPH NODE DISSECTION;  Surgeon: Hollice Espy, MD;  Location: [facility] ORS;  Service: Urology;  Laterality: Bilateral;   ROBOT ASSISTED LAPAROSCOPIC RADICAL PROSTATECTOMY N/A 04/24/2019   Procedure: XI ROBOTIC ASSISTED LAPAROSCOPIC RADICAL PROSTATECTOMY;  Surgeon: Hollice Espy, MD;  Location: [facility] ORS;  Service: Urology;  Laterality: N/A;    FAMILY HISTORY: Family History  Problem Relation Age of Onset   Bone cancer Mother    Hypertension Father    Stroke Father     ADVANCED DIRECTIVES (Y/N):  @ADVDIR$ @  HEALTH MAINTENANCE: Social History   Tobacco Use   Smoking status: Never   Smokeless tobacco: Never  Vaping Use   Vaping Use: Never used  Substance Use Topics   Alcohol use: Yes    Comment: occasion   Drug use: Never     Colonoscopy:  PAP:  Bone density:  Lipid panel:  No Known Allergies  Current Facility-Administered Medications  Medication Dose Route Frequency Provider Last Rate Last Admin   0.9 %  sodium chloride infusion   Intravenous Continuous Ivor Costa, MD 125 mL/hr at 08/01/22 0956 Infusion Verify at 08/01/22 0956   albuterol (PROVENTIL) (2.5 MG/3ML) 0.083% nebulizer solution 3 mL  3 mL Inhalation Q4H PRN Ivor Costa, MD  amLODipine (NORVASC) tablet 10 mg  10 mg Oral Daily Ivor Costa, MD   10 mg at 08/01/22 0920   cholecalciferol (VITAMIN D3) 25 MCG (1000 UNIT) tablet 5,000 Units  5,000 Units Oral Daily Ivor Costa, MD   5,000 Units at 08/01/22 0919   feeding supplement (ENSURE ENLIVE / ENSURE PLUS) liquid 237 mL  237 mL Oral BID BM Shawna Clamp, MD   237 mL at 08/01/22 1536   ferrous sulfate tablet 325 mg  325 mg  Oral Q breakfast Ivor Costa, MD   325 mg at 08/01/22 0920   fluticasone (FLONASE) 50 MCG/ACT nasal spray 1 spray  1 spray Each Nare Daily PRN Ivor Costa, MD       heparin injection 5,000 Units  5,000 Units Subcutaneous Q8H Ivor Costa, MD   5,000 Units at 07/31/22 2139   hydrALAZINE (APRESOLINE) injection 5 mg  5 mg Intravenous Q2H PRN Ivor Costa, MD       metoprolol succinate (TOPROL-XL) 24 hr tablet 100 mg  100 mg Oral Daily Ivor Costa, MD   100 mg at 08/01/22 0920   multivitamin with minerals tablet 1 tablet  1 tablet Oral Daily Ivor Costa, MD   1 tablet at 08/01/22 0920   ondansetron (ZOFRAN) injection 4 mg  4 mg Intravenous Q8H PRN Ivor Costa, MD       pantoprazole (PROTONIX) EC tablet 40 mg  40 mg Oral Daily Ivor Costa, MD   40 mg at 08/01/22 0920   simvastatin (ZOCOR) tablet 20 mg  20 mg Oral Gordan Payment, MD   20 mg at 07/31/22 1514    OBJECTIVE: Vitals:   08/01/22 0819 08/01/22 1542  BP: 135/74 (!) 140/71  Pulse: 82 81  Resp: 19 18  Temp: 99.6 F (37.6 C) 99.9 F (37.7 C)  SpO2: 96% 97%     Body mass index is 34.59 kg/m.    ECOG FS:1 - Symptomatic but completely ambulatory  General: Well-developed, well-nourished, no acute distress. Eyes: Pink conjunctiva, anicteric sclera. HEENT: Normocephalic, moist mucous membranes. Lungs: No audible wheezing or coughing. Heart: Regular rate and rhythm. Abdomen: Soft, nontender, no obvious distention. Musculoskeletal: No edema, cyanosis, or clubbing. Neuro: Alert, answering all questions appropriately. Cranial nerves grossly intact. Skin: No rashes or petechiae noted. Psych: Normal affect. Lymphatics: No cervical, calvicular, axillary or inguinal LAD.   LAB RESULTS:  Lab Results  Component Value Date   NA 133 (L) 08/01/2022   K 4.1 08/01/2022   CL 99 08/01/2022   CO2 28 08/01/2022   GLUCOSE 99 08/01/2022   BUN 15 08/01/2022   CREATININE 0.87 08/01/2022   CALCIUM 11.1 (H) 08/01/2022   PROT 6.7 08/01/2022   ALBUMIN 2.4 (L)  08/01/2022   AST 89 (H) 08/01/2022   ALT 50 (H) 08/01/2022   ALKPHOS 287 (H) 08/01/2022   BILITOT 2.0 (H) 08/01/2022   GFRNONAA >60 08/01/2022   GFRAA >60 04/25/2019    Lab Results  Component Value Date   WBC 14.3 (H) 08/01/2022   NEUTROABS 13.9 (H) 07/31/2022   HGB 9.7 (L) 08/01/2022   HCT 32.0 (L) 08/01/2022   MCV 74.6 (L) 08/01/2022   PLT 645 (H) 08/01/2022     STUDIES: CT ABDOMEN PELVIS W CONTRAST  Result Date: 07/31/2022 CLINICAL DATA:  30 pound weight loss. Increased calcium level. Increased liver enzymes. Elevated white blood cell count. Prostatectomy 4 years ago. * Tracking Code: BO * EXAM: CT ABDOMEN AND PELVIS WITH CONTRAST TECHNIQUE: Multidetector CT imaging of the  abdomen and pelvis was performed using the standard protocol following bolus administration of intravenous contrast. RADIATION DOSE REDUCTION: This exam was performed according to the departmental dose-optimization program which includes automated exposure control, adjustment of the mA and/or kV according to patient size and/or use of iterative reconstruction technique. CONTRAST:  11m OMNIPAQUE IOHEXOL 300 MG/ML  SOLN COMPARISON:  05/29/2022 chest radiograph.  Prostate MRI 03/06/2019. FINDINGS: Lower chest: Bibasilar pulmonary nodules. Example right middle lobe 5 mm nodule on 08/04. Normal heart size without pericardial or pleural effusion. Areas of left-sided pleural-based nodularity versus subpleural lymph nodes. Example 8 mm on 06/02. Hepatobiliary: Innumerable, bilateral liver masses consistent with metastasis. Example posterior right hepatic lobe (segment 7) 3.7 x 3.0 cm on 21/2. Central anterior segment 2 mass measures 3.4 x 3.2 cm on 20/2. No calcified gallstone or biliary duct dilatation. Pancreas: Normal, without mass or ductal dilatation. Spleen: Normal in size, without focal abnormality. Adrenals/Urinary Tract: Normal right adrenal gland. Mild left adrenal thickening and nodularity, including at up to 1.0 cm  on 25/2. Interpolar left renal 5.7 cm fluid density lesion is likely a cyst . In the absence of clinically indicated signs/symptoms require(s) no independent follow-up. Normal right kidney. No hydronephrosis. Normal urinary bladder. Stomach/Bowel: Proximal gastric underdistention. Scattered colonic diverticula. Normal terminal ileum and appendix. A loop of markedly thickened jejunum is identified on 57/2 and coronal image 57. Vascular/Lymphatic: Aortic atherosclerosis. Porta hepatis adenopathy including a portacaval 1.6 cm node on 36/2. Small bowel mesenteric adenopathy, centered about the thickened small bowel loop. Example nodes at up to 2.6 x 2.4 cm on 55/2. No pelvic sidewall adenopathy. Reproductive: Prostatectomy, without local recurrence. Other: Small volume abdominopelvic ascites. Extensive peritoneal metastasis. Example left omental implant of 4.2 x 4.5 cm on 47/2. No free intraperitoneal air. Tiny fat containing right inguinal hernia. Small fat containing paraumbilical hernia. Mild right-sided gynecomastia. Musculoskeletal: Presumably degenerative partial fusion of the bilateral sacroiliac joints. Lumbosacral spondylosis. IMPRESSION: 1. Widespread metastatic disease, including to liver, abdominal nodes, peritoneum, and likely the left pleural space. 2. 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 3. Small bibasilar pulmonary nodules, nonspecific but mildly suspicious for metastatic disease. 4. Small volume abdominopelvic ascites. 5. Prostatectomy, without local recurrence. Distribution of metastatic disease felt unlikely to represent prostate. 6. Indeterminate left adrenal nodule. 7.  Aortic Atherosclerosis (ICD10-I70.0). Electronically Signed   By: KAbigail MiyamotoM.D.   On: 07/31/2022 11:38    ASSESSMENT: Innumerable liver lesions, highly concerning for underlying malignancy.  PLAN:    Innumerable liver lesions, highly  concerning for underlying malignancy: CT scan results from July 31, 2022 reviewed independently and reported as above with widespread metastatic disease in the liver, abdominal lymph nodes, and peritoneum.  Patient has a markedly thickened loop of jejunum with localized adenopathy raising the suspicion that this may be a small bowel carcinoma.  CT-guided biopsy was not completed secondary to patient taking aspirin.  This can be scheduled as an outpatient next Monday or Tuesday.  Okay to discharge from an oncology standpoint.  Will arrange follow-up in the cancer center 4 to 5 days after completion of biopsy. Hypercalcemia: Likely secondary to malignancy.  Patient's calcium level is improved to 11.1. Transaminitis/hyperbilirubinemia: Improving.  Likely secondary to metastatic disease. Leukocytosis: Likely reactive, monitor. Iron deficiency anemia: Likely related to underlying GI malignancy.  Hemoglobin currently 9.7 with decreased total iron and iron saturation ratio. Thrombocytosis: Likely reactive.  Possibly secondary to iron deficiency anemia.  Appreciate  consult, will follow.   Lloyd Huger, MD   08/01/2022 5:12 PM

## 2022-08-01 NOTE — Progress Notes (Signed)
   08/01/22 1100  Spiritual Encounters  Type of Visit Initial  Care provided to: Family;Pt not available  Referral source Clinical staff  OnCall Visit Yes   Chaplain responded to [facility] consult. Patient unavailable. Chaplain communicated to family member that [place] would return later.

## 2022-08-01 NOTE — Progress Notes (Signed)
PROGRESS NOTE    Philip Arnold.  [id number] DOB: Feb 17, 1961 DOA: 07/31/2022 PCP: [name], MD    Brief Narrative:  This 62 years old Male with PMH significant for prostate cancer s/p prostatectomy, hypertension, hyperlipidemia, kidney stones presented to the ED with abnormal labs and weight loss.  Patient reports having generalized weakness and fatigue recently,  He has lost about 30 to 40 pounds unintentional weight loss.  He also reports mild shortness of breath but denies any chest pain or cough. He was seen by his PCP and found to have abnormal labs with hypercalcemia,  abnormal liver functions and leukocytosis.  Patient was sent in the ED for further evaluation. Pertinent labs in the ED showed calcium 12.9.   CT abdomen and pelvis showed widespread metastatic disease including to the liver, abdominal nodes, peritoneum and likely left pleural space.  Primary is small cell adenocarcinoma. Oncology is consulted.  Patient may need liver biopsy.  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 CT abdomen/ pelvis showed widespread metastatic disease including to the liver, abdominal nodes, peritoneum and likely to left pleural space favored primary small bowel adenocarcinoma. Oncology Dr. Grayland Ormond was consulted.  He recommended  liver biopsy. Continue IV fluid resuscitation. Continue IV Zofran as needed for nausea and vomiting. Patient need to hold aspirin for 5 days before having liver biopsy.  Hypercalcemia: Calcium of 12.9 most likely due to cancer. Continue IV fluid resuscitation. Patient was given 90 mg of IV pamidronate. Serum  calcium has slightly down to 11.1. Continue to monitor calcium  Essential Hypertension: Continue IV hydralazine as needed. Continue amlodipine and metoprolol, Hold Maxide.  Hyperlipidemia: Continue Zocor.  Abnormal LFTs: Likely due to metastatic liver disease.  ALP 321, AST 94, ALT 58, total bilirubin 2.9, Continue to monitor liver profile Avoid Tylenol.  Leukocytosis:  WBC 17.2 could be reactive, No source of infection identified.   Continue to monitor CBC.  Microcytic anemia Hemoglobin 10.6. Continue iron supplementation.  Prostate cancer s/p  Prostatectomy. Continue outpatient follow-up.  Obesity Exercise and diet discussed in detail. Estimated body mass index is 34.59 kg/m as calculated from the following:   Height as of this encounter: 5' 11"$  (1.803 m).   Weight as of this encounter: 112.5 kg.    DVT prophylaxis: Heparin Code Status: Full code. Family Communication: No family at bed side. Disposition Plan:    Status is: Inpatient Remains inpatient appropriate because: Admitted with unintentional weight loss found to have small cell adenocarcinoma, oncology is consulted patient require liver biopsy.   Consultants:  Oncology Interventional radiology  Procedures: CT abdomen and pelvis Antimicrobials: None  Subjective: Patient was seen and examined at bedside.  Overnight events noted.   Patient remains depressed, denies any pain, concerned about significant weight loss.  Objective: Vitals:   07/31/22 1627 07/31/22 2011 08/01/22 0453 08/01/22 0819  BP: (!) 151/82 129/79 (!) 147/82 135/74  Pulse: 87 76 80 82  Resp: 18 18 18 19  $ Temp: 98 F (36.7 C) 99.3 F (37.4 C) 98.9 F (37.2 C) 99.6 F (37.6 C)  TempSrc:   Oral   SpO2: 97% 97% 97% 96%  Weight:      Height:        Intake/Output Summary (Last 24 hours) at 08/01/2022 1130 Last data filed at 08/01/2022 0956 Gross per 24 hour  Intake 3051.24 ml  Output --  Net 3051.24 ml   Filed Weights    07/31/22 0854  Weight: 112.5 kg    Examination:  General exam: Appears calm and comfortable, deconditioned. Respiratory system: Clear to auscultation. Respiratory effort normal.  RR 15 Cardiovascular system: S1 & S2 heard, RRR. No JVD, murmurs, rubs, gallops or clicks. No pedal edema. Gastrointestinal system: Abdomen is soft, mildly sore, nondistended, BS+ Central nervous system: Alert and oriented. No focal neurological deficits. Extremities: No edema, no cyanosis, no clubbing. Skin: No rashes, lesions or ulcers Psychiatry: Judgement and insight appear normal. Mood & affect appropriate.     Data Reviewed: I have personally reviewed following labs and imaging studies  CBC: Recent Labs  Lab 07/31/22 0900 08/01/22 0446  WBC 17.2* 14.3*  NEUTROABS 13.9*  --   HGB 10.6* 9.7*  HCT 35.6* 32.0*  MCV 75.3* 74.6*  PLT 748* [id number]*   Basic Metabolic Panel: Recent Labs  Lab 07/31/22 0906 08/01/22 0446  NA 131* 133*  K 4.1 4.1  CL 94* 99  CO2 26 28  GLUCOSE 104* 99  BUN 14 15  CREATININE 0.89 0.87  CALCIUM 12.9* 11.1*   GFR: Estimated Creatinine Clearance: 112.3 mL/min (by C-G formula based on SCr of 0.87 mg/dL). Liver Function Tests: Recent Labs  Lab 07/31/22 0906 08/01/22 0446  AST 94* 89*  ALT 58* 50*  ALKPHOS 310* 287*  BILITOT 2.1* 2.0*  PROT 7.8 6.7  ALBUMIN 2.8* 2.4*   No results for input(s): "LIPASE", "AMYLASE" in the last 168 hours. No results for input(s): "AMMONIA" in the last 168 hours. Coagulation Profile: Recent Labs  Lab 07/31/22 1302  INR 1.2   Cardiac Enzymes: No results for input(s): "CKTOTAL", "CKMB", "CKMBINDEX", "TROPONINI" in the last 168 hours. BNP (last 3 results) No results for input(s): "PROBNP" in the last 8760 hours. HbA1C: No results for input(s): "HGBA1C" in the last 72 hours. CBG: No results for input(s): "GLUCAP" in the last 168 hours. Lipid Profile: No results for input(s): "CHOL", "HDL", "LDLCALC", "TRIG", "CHOLHDL",  "LDLDIRECT" in the last 72 hours. Thyroid Function Tests: No results for input(s): "TSH", "T4TOTAL", "FREET4", "T3FREE", "THYROIDAB" in the last 72 hours. Anemia Panel: Recent Labs    07/31/22 1300 07/31/22 1302  VITAMINB12  --  2,310*  FOLATE  --  15.5  FERRITIN  --  103  TIBC  --  202*  IRON  --  24*  RETICCTPCT 2.5  --    Sepsis Labs: No results for input(s): "PROCALCITON", "LATICACIDVEN" in the last 168 hours.  No results found for this or any previous visit (from the past 240 hour(s)).       Radiology Studies: CT ABDOMEN PELVIS W CONTRAST  Result Date: 07/31/2022 CLINICAL DATA:  30 pound weight loss. Increased calcium level. Increased liver enzymes. Elevated white blood cell count. Prostatectomy 4 years ago. * Tracking Code: BO * EXAM: CT ABDOMEN AND PELVIS WITH CONTRAST TECHNIQUE: Multidetector CT imaging of the abdomen and pelvis was performed using the standard protocol following bolus administration of intravenous contrast. RADIATION DOSE REDUCTION: This exam was performed according to the departmental dose-optimization program which includes automated exposure control, adjustment of the mA and/or kV according to patient size and/or use of iterative reconstruction technique. CONTRAST:  134m OMNIPAQUE IOHEXOL 300 MG/ML  SOLN  COMPARISON:  05/29/2022 chest radiograph.  Prostate MRI 03/06/2019. FINDINGS: Lower chest: Bibasilar pulmonary nodules. Example right middle lobe 5 mm nodule on 08/04. Normal heart size without pericardial or pleural effusion. Areas of left-sided pleural-based nodularity versus subpleural lymph nodes. Example 8 mm on 06/02. Hepatobiliary: Innumerable, bilateral liver masses consistent with metastasis. Example posterior right hepatic lobe (segment 7) 3.7 x 3.0 cm on 21/2. Central anterior segment 2 mass measures 3.4 x 3.2 cm on 20/2. No calcified gallstone or biliary duct dilatation. Pancreas: Normal, without mass or ductal dilatation. Spleen: Normal in size,  without focal abnormality. Adrenals/Urinary Tract: Normal right adrenal gland. Mild left adrenal thickening and nodularity, including at up to 1.0 cm on 25/2. Interpolar left renal 5.7 cm fluid density lesion is likely a cyst . In the absence of clinically indicated signs/symptoms require(s) no independent follow-up. Normal right kidney. No hydronephrosis. Normal urinary bladder. Stomach/Bowel: Proximal gastric underdistention. Scattered colonic diverticula. Normal terminal ileum and appendix. A loop of markedly thickened jejunum is identified on 57/2 and coronal image 57. Vascular/Lymphatic: Aortic atherosclerosis. Porta hepatis adenopathy including a portacaval 1.6 cm node on 36/2. Small bowel mesenteric adenopathy, centered about the thickened small bowel loop. Example nodes at up to 2.6 x 2.4 cm on 55/2. No pelvic sidewall adenopathy. Reproductive: Prostatectomy, without local recurrence. Other: Small volume abdominopelvic ascites. Extensive peritoneal metastasis. Example left omental implant of 4.2 x 4.5 cm on 47/2. No free intraperitoneal air. Tiny fat containing right inguinal hernia. Small fat containing paraumbilical hernia. Mild right-sided gynecomastia. Musculoskeletal: Presumably degenerative partial fusion of the bilateral sacroiliac joints. Lumbosacral spondylosis. IMPRESSION: 1. Widespread metastatic disease, including to liver, abdominal nodes, peritoneum, and likely the left pleural space. 2. 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 3. Small bibasilar pulmonary nodules, nonspecific but mildly suspicious for metastatic disease. 4. Small volume abdominopelvic ascites. 5. Prostatectomy, without local recurrence. Distribution of metastatic disease felt unlikely to represent prostate. 6. Indeterminate left adrenal nodule. 7.  Aortic Atherosclerosis (ICD10-I70.0). Electronically Signed   By: Abigail Miyamoto M.D.   On:  07/31/2022 11:38     Scheduled Meds:  amLODipine  10 mg Oral Daily   cholecalciferol  5,000 Units Oral Daily   ferrous sulfate  325 mg Oral Q breakfast   heparin  5,000 Units Subcutaneous Q8H   metoprolol succinate  100 mg Oral Daily   multivitamin with minerals  1 tablet Oral Daily   pantoprazole  40 mg Oral Daily   simvastatin  20 mg Oral QHS   Continuous Infusions:  sodium chloride 125 mL/hr at 08/01/22 0956     LOS: 1 day    Time spent: 50 mins    Kodee Ravert, MD [facility]   If 7PM-7AM, please contact night-coverage

## 2022-08-01 NOTE — Progress Notes (Signed)
Request received for liver mass biopsy during admission. Imaging was reviewed and approved by Dr. Denna Haggard for biopsy. Pt last dose ASA 07/31/22. Per guidelines, ASA requires a 5 day hold for this procedure. Per admitting provider, pt will be d/c prior to 5 day hold. Outpatient order placed for liver biopsy to be performed 08/06/22. [name] aware that order has been placed and will schedule pt as soon as he is d/c home. Admitting has held ASA and will advise pt to continue ASA hold until after liver biopsy is complete.     [name], AGNP-BC 08/01/2022, 3:11 PM

## 2022-08-02 DIAGNOSIS — C179 Malignant neoplasm of small intestine, unspecified: Secondary | ICD-10-CM | POA: Diagnosis not present

## 2022-08-02 LAB — CEA: CEA: 140 ng/mL — ABNORMAL HIGH (ref 0.0–4.7)

## 2022-08-02 NOTE — Plan of Care (Signed)

## 2022-08-02 NOTE — Discharge Summary (Signed)
Physician Discharge Summary  Philip Arnold. RO:[id number] DOB: September 22, 1960 DOA: 07/31/2022  PCP: [name], MD  Admit date: 07/31/2022  Discharge date: 08/02/2022  Admitted From: Home  Disposition:  Home.  Recommendations for Outpatient Follow-up:  Follow up with PCP in 1-2 weeks. Please obtain BMP/CBC in one week. Advised to follow-up with oncology scheduled for liver biopsy on Monday. Advised to hold aspirin for 5 days before having liver biopsy.   Advised to follow-up with oncology as scheduled.  Home Health:None Equipment/Devices:None  Discharge Condition: Stable CODE STATUS:Full code Diet recommendation: Heart Healthy   Brief [facility] Course: This 62 years old Male with PMH significant for prostate cancer s/p prostatectomy, hypertension, hyperlipidemia, kidney stones presented to the ED with abnormal labs and weight loss. Patient reports having generalized weakness and fatigue recently,  He has lost about 30 to 40 pounds unintentional weight loss. He also reports mild shortness of breath but denies any chest pain or cough. He was seen by his PCP and found to have abnormal labs with hypercalcemia,  abnormal liver functions and leukocytosis. Patient was sent in the ED for further evaluation. Pertinent labs in the ED showed calcium 12.9.  CT abdomen and pelvis showed widespread metastatic disease including to the liver, abdominal nodes, peritoneum and likely left pleural space.  Primary is small cell adenocarcinoma. Oncology is consulted. Patient may need liver biopsy.  Patient was admitted for further evaluation.  Oncology was consulted , recommended IR consult for liver biopsy. Since patient was taking aspirin. He has to stop aspirin for 5 days before having liver biopsy. Serum calcium has improved with IV fluid resuscitation. Serum calcium has improved.  Patient does not want to wait 5 days in the hospital to have a liver biopsy and wants to be discharged. Oncology  is in agreement.  Patient is being discharged home and will follow-up on Monday for liver biopsy.  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 CT abdomen/ pelvis showed widespread metastatic disease including to the liver, abdominal nodes, peritoneum and likely to left pleural space favored primary small bowel adenocarcinoma. Oncology Dr. Grayland Ormond was consulted. He recommended  liver biopsy. Continue IV fluid resuscitation. Continue IV Zofran as needed for nausea and vomiting. Patient need to hold aspirin for 5 days before having liver biopsy. Patient wants to be discharged and wants to have outpatient liver biopsy. Oncology is in agreement.   Hypercalcemia: Calcium of 12.9 most likely due to cancer. Continue IV fluid resuscitation. Patient was given 90 mg of IV pamidronate. Serum calcium has slightly down to 11.1. Continue to monitor calcium   Essential Hypertension: Continue IV hydralazine as needed. Continue amlodipine and metoprolol. Resume Maxide.   Hyperlipidemia: Continue Zocor.   Abnormal LFTs: Likely due to metastatic liver disease.  ALP 321, AST 94, ALT 58, total bilirubin 2.9, Continue to monitor liver profile Avoid Tylenol.   Leukocytosis:  WBC 17.2 could be reactive, No source of infection identified.   Continue to monitor CBC.   Microcytic anemia Hemoglobin 10.6. Continue iron supplementation.   Prostate cancer s/p  Prostatectomy. Continue outpatient follow-up.   Obesity Exercise and diet discussed in detail. Estimated  body mass index is 34.59 kg/m as calculated from the following:   Height as of this encounter:  5' 11"$  (1.803 m).   Weight as of this encounter: 112.5 kg.   Discharge Instructions  Discharge Instructions     Call MD for:  difficulty breathing, headache or visual disturbances   Complete by: As directed    Call MD for:  persistant dizziness or light-headedness   Complete by: As directed    Call MD for:  persistant nausea and vomiting   Complete by: As directed    Diet - low sodium heart healthy   Complete by: As directed    Diet Carb Modified   Complete by: As directed    Discharge instructions   Complete by: As directed    Advised to follow-up with primary care physician in 1 week. Advised to follow-up with oncology scheduled for liver biopsy on Monday. Advised to hold aspirin for 5 days before having liver biopsy.   Advised to follow-up with oncology as scheduled.   Increase activity slowly   Complete by: As directed       Allergies as of 08/02/2022   No Known Allergies      Medication List     STOP taking these medications    Fluticasone Propionate (Inhal) 100 MCG/ACT Aepb   montelukast 10 MG tablet Commonly known as: SINGULAIR   potassium chloride 10 MEQ tablet Commonly known as: KLOR-CON   sildenafil 20 MG tablet Commonly known as: Revatio       TAKE these medications    albuterol 108 (90 Base) MCG/ACT inhaler Commonly known as: VENTOLIN HFA Inhale 1-2 puffs into the lungs every 4 (four) hours as needed for shortness of breath or wheezing.   amLODipine 10 MG tablet Commonly known as: NORVASC Take 10 mg by mouth daily.   aspirin EC 81 MG tablet Take 81 mg by mouth daily.   ferrous sulfate 325 (65 FE) MG EC tablet Take 1 tablet by mouth daily with breakfast.   fluticasone 50 MCG/ACT nasal spray Commonly known as: FLONASE Place 1 spray into both nostrils daily as needed for rhinitis or allergies.   losartan 25 MG tablet Commonly known as: COZAAR Take 1 tablet by mouth daily.   metoprolol succinate 100 MG 24 hr tablet Commonly known as:  TOPROL-XL Take 100 mg by mouth daily.   multivitamin with minerals Tabs tablet Take 1 tablet by mouth daily.   omeprazole 20 MG tablet Commonly known as: PRILOSEC OTC Take 20 mg by mouth daily.   potassium chloride SA 20 MEQ tablet Commonly known as: KLOR-CON M Take 20 mEq by mouth 2 (two) times daily.   simvastatin 20 MG tablet Commonly known as: ZOCOR Take 20 mg by mouth at bedtime.   triamterene-hydrochlorothiazide 37.5-25 MG tablet Commonly known as: MAXZIDE-25 Take 1 tablet by mouth daily.   Vitamin D3 125 MCG (5000 UT) capsule Generic drug: Cholecalciferol Take 5,000 Units by mouth daily.        Follow-up Information     [name], MD. Go on 08/08/2022.   Specialty: Internal Medicine Why: @8$ :15 Contact information: [place] [address] [phone]         Lloyd Huger, MD. Go on 08/08/2022.   Specialty: Oncology Why: @ 9:30 Contact information: [place] [address] [phone]                No Known Allergies  Consultations: Oncology IR  Procedures/Studies: CT ABDOMEN PELVIS W CONTRAST  Result Date: 07/31/2022 CLINICAL DATA:  30 pound weight loss. Increased calcium level. Increased liver enzymes. Elevated white blood cell count. Prostatectomy 4 years ago. * Tracking Code: BO * EXAM: CT ABDOMEN AND PELVIS WITH CONTRAST TECHNIQUE: Multidetector CT imaging of the abdomen and pelvis was performed using the standard protocol following bolus administration of intravenous contrast. RADIATION DOSE REDUCTION: This exam was performed according to the departmental dose-optimization program which includes automated exposure control, adjustment of the mA and/or kV according to patient size and/or use of iterative reconstruction technique. CONTRAST:  159m OMNIPAQUE IOHEXOL 300 MG/ML  SOLN COMPARISON:  05/29/2022 chest radiograph.  Prostate MRI 03/06/2019. FINDINGS: Lower chest: Bibasilar pulmonary  nodules. Example right middle lobe 5 mm nodule on 08/04. Normal heart size without pericardial or pleural effusion. Areas of left-sided pleural-based nodularity versus subpleural lymph nodes. Example 8 mm on 06/02. Hepatobiliary: Innumerable, bilateral liver masses consistent with metastasis. Example posterior right hepatic lobe (segment 7) 3.7 x 3.0 cm on 21/2. Central anterior segment 2 mass measures 3.4 x 3.2 cm on 20/2. No calcified gallstone or biliary duct dilatation. Pancreas: Normal, without mass or ductal dilatation. Spleen: Normal in size, without focal abnormality. Adrenals/Urinary Tract: Normal right adrenal gland. Mild left adrenal thickening and nodularity, including at up to 1.0 cm on 25/2. Interpolar left renal 5.7 cm fluid density lesion is likely a cyst . In the absence of clinically indicated signs/symptoms require(s) no independent follow-up. Normal right kidney. No hydronephrosis. Normal urinary bladder. Stomach/Bowel: Proximal gastric underdistention. Scattered colonic diverticula. Normal terminal ileum and appendix. A loop of markedly thickened jejunum is identified on 57/2 and coronal image 57. Vascular/Lymphatic: Aortic atherosclerosis. Porta hepatis adenopathy including a portacaval 1.6 cm node on 36/2. Small bowel mesenteric adenopathy, centered about the thickened small bowel loop. Example nodes at up to 2.6 x 2.4 cm on 55/2. No pelvic sidewall adenopathy. Reproductive: Prostatectomy, without local recurrence. Other: Small volume abdominopelvic ascites. Extensive peritoneal metastasis. Example left omental implant of 4.2 x 4.5 cm on 47/2. No free intraperitoneal air. Tiny fat containing right inguinal hernia. Small fat containing paraumbilical hernia. Mild right-sided gynecomastia. Musculoskeletal: Presumably degenerative partial fusion of the bilateral sacroiliac joints. Lumbosacral spondylosis. IMPRESSION: 1. Widespread metastatic disease, including to liver, abdominal nodes,  peritoneum, and likely the left pleural space. 2. 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 3. Small bibasilar pulmonary nodules, nonspecific but mildly suspicious for metastatic disease. 4. Small volume abdominopelvic ascites. 5. Prostatectomy, without local recurrence. Distribution of metastatic disease felt unlikely to represent prostate. 6. Indeterminate left adrenal nodule. 7.  Aortic Atherosclerosis (ICD10-I70.0). Electronically Signed   By: KAbigail MiyamotoM.D.   On: 07/31/2022 11:38     Subjective: Patient was seen and examined at bedside.  Overnight events noted.   Patient reports doing much better and wants to be discharged. Patient is scheduled for outpatient liver biopsy.  Discharge Exam: Vitals:   08/02/22 0522 08/02/22 0829  BP: 139/84 (!) 148/87  Pulse: 81 87  Resp: 20 18  Temp: 98.9 F (37.2 C) 98 F (36.7 C)  SpO2: 95% 97%   Vitals:   08/01/22 1542 08/01/22 2141 08/02/22 0522 08/02/22 0829  BP: (!) 140/71 (!) 143/81 139/84 (!) 148/87  Pulse: 81 81 81 87  Resp: 18 18 20 18  $ Temp: 99.9 F (37.7 C) 99 F (37.2 C) 98.9 F (37.2 C) 98 F (36.7 C)  TempSrc:  Oral  SpO2: 97% 96% 95% 97%  Weight:      Height:        General: Pt is alert, awake, not in acute distress Cardiovascular: RRR, S1/S2 +, no rubs, no gallops Respiratory: CTA bilaterally, no wheezing, no rhonchi Abdominal: Soft, NT, ND, bowel sounds + Extremities: no edema, no cyanosis    The results of significant diagnostics from this hospitalization (including imaging, microbiology, ancillary and laboratory) are listed below for reference.     Microbiology: No results found for this or any previous visit (from the past 240 hour(s)).   Labs: BNP (last 3 results) No results for input(s): "BNP" in the last 8760 hours. Basic Metabolic Panel: Recent Labs  Lab 07/31/22 0906 08/01/22 0446  NA 131* 133*  K 4.1 4.1   CL 94* 99  CO2 26 28  GLUCOSE 104* 99  BUN 14 15  CREATININE 0.89 0.87  CALCIUM 12.9* 11.1*   Liver Function Tests: Recent Labs  Lab 07/31/22 0906 08/01/22 0446  AST 94* 89*  ALT 58* 50*  ALKPHOS 310* 287*  BILITOT 2.1* 2.0*  PROT 7.8 6.7  ALBUMIN 2.8* 2.4*   No results for input(s): "LIPASE", "AMYLASE" in the last 168 hours. No results for input(s): "AMMONIA" in the last 168 hours. CBC: Recent Labs  Lab 07/31/22 0900 08/01/22 0446  WBC 17.2* 14.3*  NEUTROABS 13.9*  --   HGB 10.6* 9.7*  HCT 35.6* 32.0*  MCV 75.3* 74.6*  PLT 748* 645*   Cardiac Enzymes: No results for input(s): "CKTOTAL", "CKMB", "CKMBINDEX", "TROPONINI" in the last 168 hours. BNP: Invalid input(s): "POCBNP" CBG: No results for input(s): "GLUCAP" in the last 168 hours. D-Dimer No results for input(s): "DDIMER" in the last 72 hours. Hgb A1c No results for input(s): "HGBA1C" in the last 72 hours. Lipid Profile No results for input(s): "CHOL", "HDL", "LDLCALC", "TRIG", "CHOLHDL", "LDLDIRECT" in the last 72 hours. Thyroid function studies No results for input(s): "TSH", "T4TOTAL", "T3FREE", "THYROIDAB" in the last 72 hours.  Invalid input(s): "FREET3" Anemia work up Recent Labs    07/31/22 1300 07/31/22 1302  VITAMINB12  --  2,310*  FOLATE  --  15.5  FERRITIN  --  103  TIBC  --  202*  IRON  --  24*  RETICCTPCT 2.5  --    Urinalysis    Component Value Date/Time   COLORURINE YELLOW 12/22/2017 1449   APPEARANCEUR Clear 04/15/2019 1101   LABSPEC 1.020 12/22/2017 1449   PHURINE 6.0 12/22/2017 1449   GLUCOSEU Negative 04/15/2019 1101   HGBUR MODERATE (A) 12/22/2017 1449   BILIRUBINUR Negative 04/15/2019 1101   KETONESUR NEGATIVE 12/22/2017 1449   PROTEINUR Negative 04/15/2019 1101   PROTEINUR NEGATIVE 12/22/2017 1449   NITRITE Negative 04/15/2019 1101   NITRITE NEGATIVE 12/22/2017 1449   LEUKOCYTESUR Negative 04/15/2019 1101   Sepsis Labs Recent Labs  Lab 07/31/22 0900  08/01/22 0446  WBC 17.2* 14.3*   Microbiology No results found for this or any previous visit (from the past 240 hour(s)).   Time coordinating discharge: Over 30 minutes  SIGNED:   Shawna Clamp, MD  [facility] 08/02/2022, 3:25 PM Pager   If 7PM-7AM, please contact night-coverage

## 2022-08-02 NOTE — Discharge Instructions (Signed)
Advised to follow-up with primary care physician in 1 week. Advised to follow-up with oncology scheduled for liver biopsy on Monday. Advised to hold aspirin for 5 days before having liver biopsy.   Advised to follow-up with oncology as scheduled.

## 2022-08-03 NOTE — Progress Notes (Signed)
Patient for US guided Liver Biopsy on Monday 08/06/2022, I called and spoke with the patient's wife Roland Rack on the phone and gave pre-procedure instructions. Pt's wife was made aware to have the pt here at 9:30a at the new entrance, last dose of ASA 45m was Tues 07/31/2022 while he was an inpatient here, NPO after MN prior to procedure as well as driver post procedure/recovery/discharge. Pt's wife stated understanding.  Called 08/03/2022

## 2022-08-05 NOTE — H&P (Signed)
Chief Complaint: Elevated liver enzymes with metastatic disease on CT. Request is liver biopsy  Referring Physician(s): Dr. Derrek Monaco  Supervising Physician: Michaelle Birks  Patient Status: [facility] - Out-pt  History of Present Illness: Philip Arnold. is a 62 y.o. male outpatient. History of HTN, GERD, prostate cancer s/p prostatectomy. Recently admitted to [facility] for abnormal labs values  including liver enzymes)  and 40 lbs weight loss. Found to have  widespread metastatic disease.  CT abd pelvis dated 2.13.24. Small volume abdominopelvic ascites. Extensive peritoneal metastasis. Example left omental implant of 4.2 x 4.5 cm on 47/2. Team is requesting liver biopsy for further evaluation of tissue diagnosis. Case approved by IR Attending Dr. Darreld Mclean. El-Abd.   Wife at bedside. Patient alert and laying in bed, calm.  Endorses lower right abdominal pain. Denies any fevers, headache, chest pain, SOB, cough,nausea, vomiting or bleeding. Return precautions and treatment recommendations and follow-up discussed with the patient and his wife both who are agreeable with the plan.    Past Medical History:  Diagnosis Date   Cancer [facility])    Prostate Cancer   Elevated PSA    GERD (gastroesophageal reflux disease)    Hip pain    History of kidney stones    Hyperglycemia    Hypertension    Obesity     Past Surgical History:  Procedure Laterality Date   COLONOSCOPY WITH PROPOFOL     PELVIC LYMPH NODE DISSECTION Bilateral 04/24/2019   Procedure: PELVIC LYMPH NODE DISSECTION;  Surgeon: Hollice Espy, MD;  Location: [facility] ORS;  Service: Urology;  Laterality: Bilateral;   ROBOT ASSISTED LAPAROSCOPIC RADICAL PROSTATECTOMY N/A 04/24/2019   Procedure: XI ROBOTIC ASSISTED LAPAROSCOPIC RADICAL PROSTATECTOMY;  Surgeon: Hollice Espy, MD;  Location: [facility] ORS;  Service: Urology;  Laterality: N/A;    Allergies: Patient has no known allergies.  Medications: Prior to Admission medications    Medication Sig Start Date End Date Taking? Authorizing Provider  albuterol (VENTOLIN HFA) 108 (90 Base) MCG/ACT inhaler Inhale 1-2 puffs into the lungs every 4 (four) hours as needed for shortness of breath or wheezing. 03/28/22 03/28/23  [provider]  amLODipine (NORVASC) 10 MG tablet Take 10 mg by mouth daily.  09/03/17   [provider]  aspirin EC 81 MG tablet Take 81 mg by mouth daily.  06/05/17   [provider]  Cholecalciferol (VITAMIN D3) 125 MCG (5000 UT) CAPS Take 5,000 Units by mouth daily.     [provider]  ferrous sulfate 325 (65 FE) MG EC tablet Take 1 tablet by mouth daily with breakfast. 05/16/22   [provider]  fluticasone (FLONASE) 50 MCG/ACT nasal spray Place 1 spray into both nostrils daily as needed for rhinitis or allergies. 07/19/22 07/19/23  [provider]  losartan (COZAAR) 25 MG tablet Take 1 tablet by mouth daily. 05/30/22 05/30/23  [provider]  metoprolol succinate (TOPROL-XL) 100 MG 24 hr tablet Take 100 mg by mouth daily.  09/03/17   [provider]  Multiple Vitamin (MULTIVITAMIN WITH MINERALS) TABS tablet Take 1 tablet by mouth daily.    [provider]  omeprazole (PRILOSEC OTC) 20 MG tablet Take 20 mg by mouth daily. 07/19/22   [provider]  potassium chloride SA (KLOR-CON M) 20 MEQ tablet Take 20 mEq by mouth 2 (two) times daily. 10/20/21   [provider]  simvastatin (ZOCOR) 20 MG tablet Take 20 mg by mouth at bedtime.  09/03/17   [provider]  triamterene-hydrochlorothiazide (MAXZIDE-25) 37.5-25 MG tablet Take 1 tablet by mouth daily.  09/03/17   [provider]     Family History  Problem Relation Age of Onset   Bone cancer Mother    Hypertension Father    Stroke Father     Social History   Socioeconomic History   Marital status: Married    Spouse name: Not on file   Number of children: Not on file   Years of education:  Not on file   Highest education level: Not on file  Occupational History   Not on file  Tobacco Use   Smoking status: Never   Smokeless tobacco: Never  Vaping Use   Vaping Use: Never used  Substance and Sexual Activity   Alcohol use: Yes    Comment: occasion   Drug use: Never   Sexual activity: Yes  Other Topics Concern   Not on file  Social History Narrative   Not on file   Social Determinants of Health   Financial Resource Strain: Not on file  Food Insecurity: No Food Insecurity (07/31/2022)   Hunger Vital Sign    Worried About Running Out of Food in the Last Year: Never true    Ran Out of Food in the Last Year: Never true  Transportation Needs: No Transportation Needs (07/31/2022)   PRAPARE - Hydrologist (Medical): No    Lack of Transportation (Non-Medical): No  Physical Activity: Not on file  Stress: Not on file  Social Connections: Not on file     Review of Systems: A 12 point ROS discussed and pertinent positives are indicated in the HPI above.  All other systems are negative.  Review of Systems  Constitutional:  Negative for fever.  HENT:  Negative for congestion.   Respiratory:  Negative for cough and shortness of breath.   Cardiovascular:  Negative for chest pain.  Gastrointestinal:  Positive for abdominal pain (right lower quadrant).  Neurological:  Negative for headaches.  Psychiatric/Behavioral:  Negative for behavioral problems and confusion.     Vital Signs: BP 123/79   Pulse 89   Temp 98 F (36.7 C) (Oral)   Resp (!) 21   Ht 5' 11"$  (1.803 m)   Wt 248 lb (112.5 kg)   SpO2 94%   BMI 34.59 kg/m     Physical Exam Vitals and nursing note reviewed.  Constitutional:      Appearance: He is well-developed. He is obese.  HENT:     Head: Normocephalic.  Cardiovascular:     Rate and Rhythm: Normal rate and regular rhythm.     Heart sounds: Normal heart sounds.  Pulmonary:     Effort: Pulmonary effort is normal.      Breath sounds: Normal breath sounds.  Musculoskeletal:        General: Normal range of motion.     Cervical back: Normal range of motion.  Skin:    General: Skin is dry.  Neurological:     Mental Status: He is alert and oriented to person, place, and time.  Psychiatric:        Mood and Affect: Mood normal.        Behavior: Behavior normal.        Thought Content: Thought content normal.        Judgment: Judgment normal.     Imaging: CT ABDOMEN PELVIS W CONTRAST  Result Date: 07/31/2022 CLINICAL DATA:  30 pound weight loss. Increased calcium level. Increased liver  enzymes. Elevated white blood cell count. Prostatectomy 4 years ago. * Tracking Code: BO * EXAM: CT ABDOMEN AND PELVIS WITH CONTRAST TECHNIQUE: Multidetector CT imaging of the abdomen and pelvis was performed using the standard protocol following bolus administration of intravenous contrast. RADIATION DOSE REDUCTION: This exam was performed according to the departmental dose-optimization program which includes automated exposure control, adjustment of the mA and/or kV according to patient size and/or use of iterative reconstruction technique. CONTRAST:  148m OMNIPAQUE IOHEXOL 300 MG/ML  SOLN COMPARISON:  05/29/2022 chest radiograph.  Prostate MRI 03/06/2019. FINDINGS: Lower chest: Bibasilar pulmonary nodules. Example right middle lobe 5 mm nodule on 08/04. Normal heart size without pericardial or pleural effusion. Areas of left-sided pleural-based nodularity versus subpleural lymph nodes. Example 8 mm on 06/02. Hepatobiliary: Innumerable, bilateral liver masses consistent with metastasis. Example posterior right hepatic lobe (segment 7) 3.7 x 3.0 cm on 21/2. Central anterior segment 2 mass measures 3.4 x 3.2 cm on 20/2. No calcified gallstone or biliary duct dilatation. Pancreas: Normal, without mass or ductal dilatation. Spleen: Normal in size, without focal abnormality. Adrenals/Urinary Tract: Normal right adrenal gland. Mild left  adrenal thickening and nodularity, including at up to 1.0 cm on 25/2. Interpolar left renal 5.7 cm fluid density lesion is likely a cyst . In the absence of clinically indicated signs/symptoms require(s) no independent follow-up. Normal right kidney. No hydronephrosis. Normal urinary bladder. Stomach/Bowel: Proximal gastric underdistention. Scattered colonic diverticula. Normal terminal ileum and appendix. A loop of markedly thickened jejunum is identified on 57/2 and coronal image 57. Vascular/Lymphatic: Aortic atherosclerosis. Porta hepatis adenopathy including a portacaval 1.6 cm node on 36/2. Small bowel mesenteric adenopathy, centered about the thickened small bowel loop. Example nodes at up to 2.6 x 2.4 cm on 55/2. No pelvic sidewall adenopathy. Reproductive: Prostatectomy, without local recurrence. Other: Small volume abdominopelvic ascites. Extensive peritoneal metastasis. Example left omental implant of 4.2 x 4.5 cm on 47/2. No free intraperitoneal air. Tiny fat containing right inguinal hernia. Small fat containing paraumbilical hernia. Mild right-sided gynecomastia. Musculoskeletal: Presumably degenerative partial fusion of the bilateral sacroiliac joints. Lumbosacral spondylosis. IMPRESSION: 1. Widespread metastatic disease, including to liver, abdominal nodes, peritoneum, and likely the left pleural space. 2. 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 3. Small bibasilar pulmonary nodules, nonspecific but mildly suspicious for metastatic disease. 4. Small volume abdominopelvic ascites. 5. Prostatectomy, without local recurrence. Distribution of metastatic disease felt unlikely to represent prostate. 6. Indeterminate left adrenal nodule. 7.  Aortic Atherosclerosis (ICD10-I70.0). Electronically Signed   By: KAbigail MiyamotoM.D.   On: 07/31/2022 11:38    Labs:  CBC: Recent Labs    07/31/22 0900 08/01/22 0446  08/06/22 1006  WBC 17.2* 14.3* 18.5*  HGB 10.6* 9.7* 10.7*  HCT 35.6* 32.0* 35.2*  PLT 748* 645* 725*    COAGS: Recent Labs    07/31/22 1302  INR 1.2  APTT 29    BMP: Recent Labs    07/31/22 0906 08/01/22 0446  NA 131* 133*  K 4.1 4.1  CL 94* 99  CO2 26 28  GLUCOSE 104* 99  BUN 14 15  CALCIUM 12.9* 11.1*  CREATININE 0.89 0.87  GFRNONAA >60 >60    LIVER FUNCTION TESTS: Recent Labs    07/31/22 0906 08/01/22 0446  BILITOT 2.1* 2.0*  AST 94* 89*  ALT 58* 50*  ALKPHOS 310* 287*  PROT 7.8 6.7  ALBUMIN 2.8* 2.4*     Assessment and Plan:  62 y.o. male outpatient. History of HTN, GERD, prostate cancer s/p prostatectomy. Recently admitted to [facility] for abnormal labs values  including liver enzymes)  and 40 lbs weight loss. Found to have  widespread metastatic disease.  CT abd pelvis dated 2.13.24. Small volume abdominopelvic ascites. Extensive peritoneal metastasis. Example left omental implant of 4.2 x 4.5 cm on 47/2. Team is requesting liver biopsy for further evaluation of tissue diagnosis. Case approved by IR Attending Dr. Darreld Mclean. El-Abd.  WBC 18.5 .All labs are within acceptable parameters. Patient is on ASA 81 mg. Last dose given 5 days ago per wife at bedside. NKDA. Patient has been NPO since midnight.   Risks and benefits of liver was discussed with the patient and/or patient's family including, but not limited to bleeding, infection, damage to adjacent structures or low yield requiring additional tests.  All of the questions were answered and there is agreement to proceed.  Consent signed and in chart.  Thank you for this interesting consult.  I greatly enjoyed meeting Xaver Hames. and look forward to participating in their care.  A copy of this report was sent to the requesting provider on this date.  Electronically Signed: Jacqualine Mau, NP 08/06/2022, 10:26 AM   I spent a total of  30 Minutes   in face to face in clinical consultation, greater  than 50% of which was counseling/coordinating care for liver lesion biopsy

## 2022-08-06 ENCOUNTER — Other Ambulatory Visit: Payer: Self-pay | Admitting: Student

## 2022-08-06 ENCOUNTER — Ambulatory Visit
Admission: RE | Admit: 2022-08-06 | Discharge: 2022-08-06 | Disposition: A | Discharge status: Home / Self Care | Payer: BC Managed Care – PPO | Source: Ambulatory Visit | Attending: Internal Medicine | Admitting: Internal Medicine

## 2022-08-06 ENCOUNTER — Ambulatory Visit: Payer: BC Managed Care – PPO

## 2022-08-06 DIAGNOSIS — R160 Hepatomegaly, not elsewhere classified: Secondary | ICD-10-CM

## 2022-08-06 DIAGNOSIS — C799 Secondary malignant neoplasm of unspecified site: Secondary | ICD-10-CM

## 2022-08-06 DIAGNOSIS — C787 Secondary malignant neoplasm of liver and intrahepatic bile duct: Secondary | ICD-10-CM | POA: Diagnosis not present

## 2022-08-06 DIAGNOSIS — K769 Liver disease, unspecified: Secondary | ICD-10-CM | POA: Diagnosis present

## 2022-08-06 LAB — COMPREHENSIVE METABOLIC PANEL
ALT: 82 U/L — ABNORMAL HIGH (ref 0–44)
AST: 149 U/L — ABNORMAL HIGH (ref 15–41)
Albumin: 2.5 g/dL — ABNORMAL LOW (ref 3.5–5.0)
Alkaline Phosphatase: 352 U/L — ABNORMAL HIGH (ref 38–126)
Anion gap: 7 (ref 5–15)
BUN: 10 mg/dL (ref 8–23)
CO2: 27 mmol/L (ref 22–32)
Calcium: 8.8 mg/dL — ABNORMAL LOW (ref 8.9–10.3)
Chloride: 98 mmol/L (ref 98–111)
Creatinine, Ser: 0.69 mg/dL (ref 0.61–1.24)
GFR, Estimated: >60 mL/min (ref >60)
Glucose, Bld: 113 mg/dL — ABNORMAL HIGH (ref 70–99)
Potassium: 4 mmol/L (ref 3.5–5.1)
Sodium: 132 mmol/L — ABNORMAL LOW (ref 135–145)
Total Bilirubin: 2.9 mg/dL — ABNORMAL HIGH (ref 0.3–1.2)
Total Protein: 6.9 g/dL (ref 6.5–8.1)

## 2022-08-06 LAB — PROTIME-INR
INR: 1.2 (ref 0.8–1.2)
Prothrombin Time: 14.8 seconds (ref 11.4–15.2)

## 2022-08-06 LAB — CBC
HCT: 35.2 % — ABNORMAL LOW (ref 39.0–52.0)
Hemoglobin: 10.7 g/dL — ABNORMAL LOW (ref 13.0–17.0)
MCH: 22.7 pg — ABNORMAL LOW (ref 26.0–34.0)
MCHC: 30.4 g/dL (ref 30.0–36.0)
MCV: 74.6 fL — ABNORMAL LOW (ref 80.0–100.0)
Platelets: 725 10*3/uL — ABNORMAL HIGH (ref 150–400)
RBC: 4.72 MIL/uL (ref 4.22–5.81)
RDW: 21.6 % — ABNORMAL HIGH (ref 11.5–15.5)
WBC: 18.5 10*3/uL — ABNORMAL HIGH (ref 4.0–10.5)
nRBC: 0.1 % (ref 0.0–0.2)

## 2022-08-06 LAB — PTH-RELATED PEPTIDE: PTH-related peptide: 2.1 pmol/L

## 2022-08-06 MED ORDER — MIDAZOLAM HCL 2 MG/2ML IJ SOLN
INTRAMUSCULAR | Status: AC | PRN
  Administered 2022-08-06: 1 mg via INTRAVENOUS

## 2022-08-06 MED ORDER — FENTANYL CITRATE (PF) 100 MCG/2ML IJ SOLN
INTRAMUSCULAR | Status: AC
  Filled 2022-08-06: qty 2

## 2022-08-06 MED ORDER — SODIUM CHLORIDE 0.9 % IV SOLN: INTRAVENOUS | Status: DC

## 2022-08-06 MED ORDER — FENTANYL CITRATE (PF) 100 MCG/2ML IJ SOLN
INTRAMUSCULAR | Status: AC | PRN
  Administered 2022-08-06: 50 ug via INTRAVENOUS

## 2022-08-06 MED ORDER — LIDOCAINE HCL (PF) 1 % IJ SOLN
10.0000 mL | Freq: Once | INTRAMUSCULAR | Status: AC
  Administered 2022-08-06: 10 mL via INTRADERMAL

## 2022-08-06 MED ORDER — MIDAZOLAM HCL 2 MG/2ML IJ SOLN
INTRAMUSCULAR | Status: AC
  Filled 2022-08-06: qty 2

## 2022-08-06 NOTE — Procedures (Signed)
Vascular and Interventional Radiology Procedure Note  Patient: Philip Arnold. DOB: Sep 26, 1960 Medical Record Number: [id number] Note Date/Time: 08/06/22 11:31 AM   Performing Physician: Michaelle Birks, MD Assistant(s): None  Diagnosis: Liver lesions. No Dx   Procedure: LIVER MASS BIOPSY  Anesthesia: Conscious Sedation Complications: None Estimated Blood Loss: Minimal Specimens: Sent for Pathology  Findings:  Successful Ultrasound-guided biopsy of liver mass. A total of 3 samples were obtained. Hemostasis of the tract was achieved using Gelfoam Slurry Embolization.  Plan: Bed rest for 2 hours.  See detailed procedure note with images in PACS. The patient tolerated the procedure well without incident or complication and was returned to Recovery in stable condition.    Michaelle Birks, MD [facility] Rad[facility]   Pager. [place]

## 2022-08-06 NOTE — Discharge Instructions (Signed)
Liver Biopsy, Care After These instructions give you information about how to care for yourself after your procedure. Your health care provider may also give you more specific instructions. If you have problems or questions, contact your health care provider. What can I expect after the procedure? After your procedure, it is common to have:  Pain and soreness in the area where the biopsy was done.  Bruising around the area where the biopsy was done.  Sleepiness and fatigue for 1-2 days. Follow these instructions at home: Medicines  Take over-the-counter and prescription medicines only as told by your health care provider.  If you were prescribed an antibiotic medicine, take it as told by your health care provider. Do not stop taking the antibiotic even if you start to feel better.  Do not take medicines such as aspirin and ibuprofen unless your health care provider tells you to take them. These medicines thin your blood and can increase the risk of bleeding.  If you are taking prescription pain medicine, take actions to prevent or treat constipation. Your health care provider may recommend that you: ? Drink enough fluid to keep your urine pale yellow. ? Eat foods that are high in fiber, such as fresh fruits and vegetables, whole grains, and beans. ? Limit foods that are high in fat and processed sugars, such as fried or sweet foods. ? Take an over-the-counter or prescription medicine for constipation. Incision care ? Wash your hands with soap and water before you change your bandage (dressing). If soap and water are not available, use hand sanitizer. ? Change your bandage tomorrow after you shower and then remove the next day.  Check your incision area every day for signs of infection. Check for: ? Redness, swelling, or pain. ? Fluid or blood. ? Warmth. ? Pus or a bad smell. You may shower tomorrow  No lifting more than 5 lbs for 3 days  Return to your normal activities as told  by your health care provider.   Do not drive or use heavy machinery for 24hrs or while taking prescription pain medicine.  Do not play contact sports for 2 weeks after the procedure. General instructions   Do not drink alcohol in the first week after the procedure.  Have someone stay with you for at least 24 hours after the procedure.  It is your responsibility to obtain your test results. Ask your health care provider, or the department that is doing the test: ? When will my results be ready? ? How will I get my results? ? What are my treatment options? ? What other tests do I need? ? What are my next steps?  Keep all follow-up visits as told by your health care provider. This is important. Contact a health care provider if:  You have increased bleeding from an incision, resulting in more than a small spot of blood.  You have redness, swelling, or increasing pain in any incisions.  You notice a discharge or a bad smell coming from any of your incisions.  You have a fever or chills. Get help right away if:  You develop swelling, bloating, or pain in your abdomen.  You become dizzy or faint.  You develop a rash.  You have nausea or you vomit.  You faint, or you have shortness of breath or difficulty breathing.  You develop chest pain.  You have problems with your speech or vision.  You have trouble with your balance or moving your arms or legs. Summary    After the liver biopsy, it is common to have pain, soreness, and bruising in the area, as well as sleepiness and fatigue.  Take over-the-counter and prescription medicines only as told by your health care provider.  Follow instructions from your health care provider about how to care for your incision. Check the incision area daily for signs of infection. This information is not intended to replace advice given to you by your health care provider. Make sure you discuss any questions you have with your health care  provider. Document Released: 12/22/2004 Document Revised: 07/28/2018 Document Reviewed: 06/14/2017 Elsevier Patient Education  2020 [employer] 

## 2022-08-06 NOTE — Progress Notes (Signed)
Patient clinically stable post liver biopsy per Dr Maryelizabeth Kaufmann, tolerated well with vitals stable pre and post procedure. Denies complaints post procedure. Received Versed 1 mg along with Fentanyl 50 mcg IV for procedure. Report given to Genelle Bal Rn post procedure330

## 2022-08-08 ENCOUNTER — Telehealth: Payer: Self-pay

## 2022-08-08 ENCOUNTER — Inpatient Hospital Stay: Payer: BC Managed Care – PPO | Attending: Oncology | Admitting: Oncology

## 2022-08-08 ENCOUNTER — Telehealth: Payer: Self-pay | Admitting: *Deleted

## 2022-08-08 ENCOUNTER — Encounter: Payer: Self-pay | Admitting: Oncology

## 2022-08-08 ENCOUNTER — Other Ambulatory Visit: Payer: Self-pay | Admitting: Anatomic Pathology & Clinical Pathology

## 2022-08-08 VITALS — BP 131/77 | HR 114 | Temp 97.8°F | Resp 18 | Ht 71.0 in | Wt 248.0 lb

## 2022-08-08 DIAGNOSIS — D75839 Thrombocytosis, unspecified: Secondary | ICD-10-CM | POA: Diagnosis not present

## 2022-08-08 DIAGNOSIS — C786 Secondary malignant neoplasm of retroperitoneum and peritoneum: Secondary | ICD-10-CM | POA: Insufficient documentation

## 2022-08-08 DIAGNOSIS — E871 Hypo-osmolality and hyponatremia: Secondary | ICD-10-CM | POA: Insufficient documentation

## 2022-08-08 DIAGNOSIS — C179 Malignant neoplasm of small intestine, unspecified: Secondary | ICD-10-CM | POA: Diagnosis not present

## 2022-08-08 DIAGNOSIS — C61 Malignant neoplasm of prostate: Secondary | ICD-10-CM | POA: Diagnosis not present

## 2022-08-08 DIAGNOSIS — D649 Anemia, unspecified: Secondary | ICD-10-CM | POA: Insufficient documentation

## 2022-08-08 DIAGNOSIS — C787 Secondary malignant neoplasm of liver and intrahepatic bile duct: Secondary | ICD-10-CM | POA: Insufficient documentation

## 2022-08-08 DIAGNOSIS — C801 Malignant (primary) neoplasm, unspecified: Secondary | ICD-10-CM | POA: Insufficient documentation

## 2022-08-08 DIAGNOSIS — D72829 Elevated white blood cell count, unspecified: Secondary | ICD-10-CM | POA: Diagnosis not present

## 2022-08-08 LAB — SURGICAL PATHOLOGY

## 2022-08-08 MED ORDER — PROCHLORPERAZINE MALEATE 10 MG PO TABS: 10.0000 mg | ORAL_TABLET | Freq: Four times a day (QID) | ORAL | 2 refills | Status: DC | PRN

## 2022-08-08 MED ORDER — LIDOCAINE-PRILOCAINE 2.5-2.5 % EX CREA: TOPICAL_CREAM | CUTANEOUS | 3 refills | Status: DC

## 2022-08-08 MED ORDER — ONDANSETRON HCL 8 MG PO TABS: 8.0000 mg | ORAL_TABLET | Freq: Three times a day (TID) | ORAL | 2 refills | Status: DC | PRN

## 2022-08-08 NOTE — Progress Notes (Signed)
[place]  [phone] [phone]  ID: Philip Arnold. 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  INTERVAL HISTORY: Patient returns to clinic today for hospital follow-up, discussion of his biopsy results, and treatment planning.  He has persistent abdominal pain.  He also has a poor appetite.  He has no neurologic complaints.  He denies any recent fevers or illnesses.  He has no chest pain, shortness of breath, cough, or hemoptysis.  He denies any nausea, vomiting, constipation, or diarrhea.  He has no melena or hematochezia.  He has no urinary complaints.  Patient offers no further specific complaints today.  REVIEW OF SYSTEMS:   Review of Systems  Constitutional:  Positive for malaise/fatigue. Negative for fever and weight loss.  Respiratory: Negative.  Negative for cough, hemoptysis and shortness of breath.   Cardiovascular: Negative.  Negative for chest pain and leg swelling.  Gastrointestinal:  Positive for abdominal pain. Negative for blood in stool, constipation, diarrhea and melena.  Genitourinary: Negative.  Negative for dysuria.  Musculoskeletal:  Negative for back pain.  Skin: Negative.  Negative for rash.  Neurological:  Positive for weakness. Negative for dizziness, focal weakness and headaches.  Psychiatric/Behavioral: Negative.  The patient is not nervous/anxious.     As per HPI. Otherwise, a complete review of systems is negative.  PAST MEDICAL HISTORY: Past Medical History:  Diagnosis Date   Cancer [facility])    Prostate Cancer   Elevated PSA    GERD (gastroesophageal reflux disease)    Hip pain    History of kidney stones    Hyperglycemia    Hypertension    Obesity     PAST SURGICAL HISTORY: Past Surgical History:  Procedure Laterality Date   COLONOSCOPY  WITH PROPOFOL     PELVIC LYMPH NODE DISSECTION Bilateral 04/24/2019   Procedure: PELVIC LYMPH NODE DISSECTION;  Surgeon: Hollice Espy, MD;  Location: [facility] ORS;  Service: Urology;  Laterality: Bilateral;   ROBOT ASSISTED LAPAROSCOPIC RADICAL PROSTATECTOMY N/A 04/24/2019   Procedure: XI ROBOTIC ASSISTED LAPAROSCOPIC RADICAL PROSTATECTOMY;  Surgeon: Hollice Espy, MD;  Location: [facility] ORS;  Service: Urology;  Laterality: N/A;    FAMILY HISTORY: Family History  Problem Relation Age of Onset   Bone cancer Mother    Hypertension Father    Stroke Father     ADVANCED DIRECTIVES (Y/N):  N  HEALTH MAINTENANCE: Social History   Tobacco Use   Smoking status: Never   Smokeless tobacco: Never  Vaping Use   Vaping Use: Never used  Substance Use Topics   Alcohol use: Yes    Comment: occasion   Drug use: Never     Colonoscopy:  PAP:  Bone density:  Lipid panel:  No Known Allergies  Current Outpatient Medications  Medication Sig Dispense Refill   amLODipine (NORVASC) 10 MG tablet Take 10 mg by mouth daily.      aspirin EC 81 MG tablet Take 81 mg by mouth daily.      Cholecalciferol (VITAMIN D3) 125 MCG (5000 UT) CAPS Take 5,000 Units by mouth daily.      ferrous sulfate 325 (65 FE) MG EC tablet Take 1 tablet by mouth daily with breakfast.     fluticasone (FLONASE) 50 MCG/ACT nasal spray Place 1 spray into both nostrils daily as needed for rhinitis or allergies.     losartan (COZAAR)  25 MG tablet Take 1 tablet by mouth daily.     metoprolol succinate (TOPROL-XL) 100 MG 24 hr tablet Take 100 mg by mouth daily.      Multiple Vitamin (MULTIVITAMIN WITH MINERALS) TABS tablet Take 1 tablet by mouth daily.     omeprazole (PRILOSEC OTC) 20 MG tablet Take 20 mg by mouth daily.     potassium chloride SA (KLOR-CON M) 20 MEQ tablet Take 20 mEq by mouth 2 (two) times daily.     simvastatin (ZOCOR) 20 MG tablet Take 20 mg by mouth at bedtime.      triamterene-hydrochlorothiazide (MAXZIDE-25)  37.5-25 MG tablet Take 1 tablet by mouth daily.      albuterol (VENTOLIN HFA) 108 (90 Base) MCG/ACT inhaler Inhale 1-2 puffs into the lungs every 4 (four) hours as needed for shortness of breath or wheezing. (Patient not taking: Reported on 08/08/2022)     lidocaine-prilocaine (EMLA) cream Apply to affected area once 30 g 3   ondansetron (ZOFRAN) 8 MG tablet Take 1 tablet (8 mg total) by mouth every 8 (eight) hours as needed for nausea or vomiting. Start on the third day after chemotherapy. 60 tablet 2   prochlorperazine (COMPAZINE) 10 MG tablet Take 1 tablet (10 mg total) by mouth every 6 (six) hours as needed for nausea or vomiting. 60 tablet 2   No current facility-administered medications for this visit.    OBJECTIVE: Vitals:   08/08/22 0943  BP: 131/77  Pulse: (!) 114  Resp: 18  Temp: 97.8 F (36.6 C)  SpO2: 98%     Body mass index is 34.59 kg/m.    ECOG FS:1 - Symptomatic but completely ambulatory  General: Well-developed, well-nourished, no acute distress. Eyes: Pink conjunctiva, anicteric sclera. HEENT: Normocephalic, moist mucous membranes. Lungs: No audible wheezing or coughing. Heart: Regular rate and rhythm. Abdomen: Soft, nontender, no obvious distention. Musculoskeletal: No edema, cyanosis, or clubbing. Neuro: Alert, answering all questions appropriately. Cranial nerves grossly intact. Skin: No rashes or petechiae noted. Psych: Normal affect. Lymphatics: No cervical, calvicular, axillary or inguinal LAD.   LAB RESULTS:  Lab Results  Component Value Date   NA 132 (L) 08/06/2022   K 4.0 08/06/2022   CL 98 08/06/2022   CO2 27 08/06/2022   GLUCOSE 113 (H) 08/06/2022   BUN 10 08/06/2022   CREATININE 0.69 08/06/2022   CALCIUM 8.8 (L) 08/06/2022   PROT 6.9 08/06/2022   ALBUMIN 2.5 (L) 08/06/2022   AST 149 (H) 08/06/2022   ALT 82 (H) 08/06/2022   ALKPHOS 352 (H) 08/06/2022   BILITOT 2.9 (H) 08/06/2022   GFRNONAA >60 08/06/2022   GFRAA >60 04/25/2019    Lab  Results  Component Value Date   WBC 18.5 (H) 08/06/2022   NEUTROABS 13.9 (H) 07/31/2022   HGB 10.7 (L) 08/06/2022   HCT 35.2 (L) 08/06/2022   MCV 74.6 (L) 08/06/2022   PLT 725 (H) 08/06/2022     STUDIES: US BIOPSY (LIVER)  Result Date: 08/06/2022 INDICATION: liver mass EXAM: ULTRASOUND GUIDED LIVER MASS BIOPSY COMPARISON:  CT AP, 07/31/2022. MEDICATIONS: None ANESTHESIA/SEDATION: Moderate (conscious) sedation was employed during this procedure. A total of Versed 1 mg and Fentanyl 50 mcg was administered intravenously. Moderate Sedation Time: 10 minutes. The patient's level of consciousness and vital signs were monitored continuously by radiology nursing throughout the procedure under my direct supervision. COMPLICATIONS: None immediate. PROCEDURE: Informed written consent was obtained from the patient and/or patient's representative after a discussion of the risks, benefits and alternatives to  treatment. The patient understands and consents the procedure. A timeout was performed prior to the initiation of the procedure. Ultrasound scanning was performed of the right upper abdominal quadrant demonstrates multifocal liver masses A LEFT hepatic lobe mass was selected for biopsy and the procedure was planned. The right upper abdominal quadrant was prepped and draped in the usual sterile fashion. The overlying soft tissues were anesthetized with 1% lidocaine with epinephrine. A 17 gauge, 6.8 cm co-axial needle was advanced into a peripheral aspect of the lesion. This was followed by 3 core biopsies with an 18 gauge core device under direct ultrasound guidance. The coaxial needle tract was embolized with a small amount of Gel-Foam slurry and superficial hemostasis was obtained with manual compression. Post procedural scanning was negative for definitive area of hemorrhage or additional complication. A dressing was placed. The patient tolerated the procedure well without immediate post procedural  complication. IMPRESSION: Successful ultrasound guided core needle biopsy of liver mass. Michaelle Birks, MD [facility] Electronically Signed   By: Michaelle Birks M.D.   On: 08/06/2022 14:53   CT ABDOMEN PELVIS W CONTRAST  Result Date: 07/31/2022 CLINICAL DATA:  30 pound weight loss. Increased calcium level. Increased liver enzymes. Elevated white blood cell count. Prostatectomy 4 years ago. * Tracking Code: BO * EXAM: CT ABDOMEN AND PELVIS WITH CONTRAST TECHNIQUE: Multidetector CT imaging of the abdomen and pelvis was performed using the standard protocol following bolus administration of intravenous contrast. RADIATION DOSE REDUCTION: This exam was performed according to the departmental dose-optimization program which includes automated exposure control, adjustment of the mA and/or kV according to patient size and/or use of iterative reconstruction technique. CONTRAST:  170m OMNIPAQUE IOHEXOL 300 MG/ML  SOLN COMPARISON:  05/29/2022 chest radiograph.  Prostate MRI 03/06/2019. FINDINGS: Lower chest: Bibasilar pulmonary nodules. Example right middle lobe 5 mm nodule on 08/04. Normal heart size without pericardial or pleural effusion. Areas of left-sided pleural-based nodularity versus subpleural lymph nodes. Example 8 mm on 06/02. Hepatobiliary: Innumerable, bilateral liver masses consistent with metastasis. Example posterior right hepatic lobe (segment 7) 3.7 x 3.0 cm on 21/2. Central anterior segment 2 mass measures 3.4 x 3.2 cm on 20/2. No calcified gallstone or biliary duct dilatation. Pancreas: Normal, without mass or ductal dilatation. Spleen: Normal in size, without focal abnormality. Adrenals/Urinary Tract: Normal right adrenal gland. Mild left adrenal thickening and nodularity, including at up to 1.0 cm on 25/2. Interpolar left renal 5.7 cm fluid density lesion is likely a cyst . In the absence of clinically indicated signs/symptoms require(s) no  independent follow-up. Normal right kidney. No hydronephrosis. Normal urinary bladder. Stomach/Bowel: Proximal gastric underdistention. Scattered colonic diverticula. Normal terminal ileum and appendix. A loop of markedly thickened jejunum is identified on 57/2 and coronal image 57. Vascular/Lymphatic: Aortic atherosclerosis. Porta hepatis adenopathy including a portacaval 1.6 cm node on 36/2. Small bowel mesenteric adenopathy, centered about the thickened small bowel loop. Example nodes at up to 2.6 x 2.4 cm on 55/2. No pelvic sidewall adenopathy. Reproductive: Prostatectomy, without local recurrence. Other: Small volume abdominopelvic ascites. Extensive peritoneal metastasis. Example left omental implant of 4.2 x 4.5 cm on 47/2. No free intraperitoneal air. Tiny fat containing right inguinal hernia. Small fat containing paraumbilical hernia. Mild right-sided gynecomastia. Musculoskeletal: Presumably degenerative partial fusion of the bilateral sacroiliac joints. Lumbosacral spondylosis. IMPRESSION: 1. Widespread metastatic disease, including to liver, abdominal nodes, peritoneum, and likely the left pleural space. 2. Favored primary is small-bowel adenocarcinoma, as evidenced by a markedly thickened  loop of jejunum with localized adenopathy. 3. Small bibasilar pulmonary nodules, nonspecific but mildly suspicious for metastatic disease. 4. Small volume abdominopelvic ascites. 5. Prostatectomy, without local recurrence. Distribution of metastatic disease felt unlikely to represent prostate. 6. Indeterminate left adrenal nodule. 7.  Aortic Atherosclerosis (ICD10-I70.0). Electronically Signed   By: Abigail Miyamoto M.D.   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   Patient will require chest CT to complete the staging workup.  Case discussed with pathology and biopsy consistent with adenocarcinoma of lower GI origin.  Further testing is required to determine whether this is from small bowel or large bowel.  Patient will benefit from chemotherapy with FOLFOX plus Avastin every 2 weeks.  He will require port placement prior to initiating treatment.  Return to clinic in 1 week for further evaluation and consideration of cycle 1. Pain: Patient was given a prescription for tramadol by his primary care physician.  Continue as prescribed.  Patient has been instructed if he needs something stronger to call clinic. Poor appetite: Monitor.  Consider dietary referral in the future. Leukocytosis: Likely reactive. Anemia: Mild, monitor.  Patient's hemoglobin is 10.7. Thrombocytosis: Likely reactive, monitor. Hyponatremia: Chronic and unchanged.  Patient's most recent sodium is 132.   Patient expressed understanding and was in agreement with this plan. He also understands that He can call clinic at any time with any questions, concerns, or complaints.    Cancer Staging  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[place]) Staging form: Small Intestine - Adenocarcinoma, AJCC 8th Edition - Clinical stage from 08/08/2022: Stage IV (cTX, cN2, pM1) - Signed by Lloyd Huger, MD on 08/08/2022 Stage prefix: Initial diagnosis   Lloyd Huger, MD   08/08/2022 2:54 PM

## 2022-08-08 NOTE — Telephone Encounter (Signed)
Received FMLA form for wife, completed and sent for doctor signature

## 2022-08-08 NOTE — Progress Notes (Signed)
START ON PATHWAY REGIMEN - Colorectal     A cycle is every 14 days:     Bevacizumab-xxxx      Oxaliplatin      Leucovorin      Fluorouracil      Fluorouracil   **Always confirm dose/schedule in your pharmacy ordering system**  Patient Characteristics: Distant Metastases, Nonsurgical Candidate, KRAS/NRAS Mutation Positive/Unknown (BRAF V600 Wild-Type/Unknown), Standard Cytotoxic Therapy, First Line Standard Cytotoxic Therapy, Bevacizumab Eligible, PS = 0,1 Tumor Location: Colon Therapeutic Status: Distant Metastases Microsatellite/Mismatch Repair Status: Unknown BRAF Mutation Status: Awaiting Test Results KRAS/NRAS Mutation Status: Awaiting Test Results Preferred Therapy Approach: Standard Cytotoxic Therapy Standard Cytotoxic Line of Therapy: First Line Standard Cytotoxic Therapy ECOG Performance Status: 1 Bevacizumab Eligibility: Eligible Intent of Therapy: Non-Curative / Palliative Intent, Discussed with Patient 

## 2022-08-08 NOTE — Telephone Encounter (Signed)
Has an appointment scheduled for port placement on Mon 2/26 at 1:30p arrive at 12:30p for his port insertion. Patients wife is aware of appointment date and time and expressed understanding.

## 2022-08-09 ENCOUNTER — Other Ambulatory Visit: Payer: Self-pay

## 2022-08-09 NOTE — Progress Notes (Signed)
Spoke with patient 08/09/22 @ 10:45am. Philip Arnold over pre-procedure instructions for port placement on 08/13/22. Advised patient to arrive at 12:30 for 1:30 procedure, to be NPO after midnight on 08/12/22 and to have driver post-procedure. Patient verbalized understanding.

## 2022-08-10 ENCOUNTER — Other Ambulatory Visit: Payer: Self-pay | Admitting: Internal Medicine

## 2022-08-10 ENCOUNTER — Encounter: Payer: Self-pay | Admitting: Oncology

## 2022-08-10 DIAGNOSIS — C61 Malignant neoplasm of prostate: Secondary | ICD-10-CM

## 2022-08-10 NOTE — Telephone Encounter (Signed)
FMLA completed copied for chart and patient wife will pick up original copy 08/14/22 at his next appt

## 2022-08-10 NOTE — H&P (Signed)
Chief Complaint: Patient was seen in consultation today for stage IV adenocarcinoma at the request of Finnegan,Timothy J  Referring Physician(s): Finnegan,Timothy J  Supervising Physician: Juliet Rude  Patient Status: [facility] - Out-pt  History of Present Illness: Philip Brownsberger. is a 62 y.o. male recently admitted to [facility] for abnormal labs including LFTs and 40 pound unintentional weight loss.  CT AP 07/31/2022 found patient to have widespread metastatic disease, small volume abdominopelvic ascites, extensive peritoneal metastasis and left omental implant.  Patient underwent liver lesion biopsy 08/06/2022 that resulted positive for malignancy as metastatic adenocarcinoma.  Patient has been referred to IR by oncology for tunneled catheter with port placement for treatment.  Past Medical History:  Diagnosis Date   Cancer [facility])    Prostate Cancer   Elevated PSA    GERD (gastroesophageal reflux disease)    Hip pain    History of kidney stones    Hyperglycemia    Hypertension    Obesity     Past Surgical History:  Procedure Laterality Date   COLONOSCOPY WITH PROPOFOL     PELVIC LYMPH NODE DISSECTION Bilateral 04/24/2019   Procedure: PELVIC LYMPH NODE DISSECTION;  Surgeon: Hollice Espy, MD;  Location: [facility] ORS;  Service: Urology;  Laterality: Bilateral;   ROBOT ASSISTED LAPAROSCOPIC RADICAL PROSTATECTOMY N/A 04/24/2019   Procedure: XI ROBOTIC ASSISTED LAPAROSCOPIC RADICAL PROSTATECTOMY;  Surgeon: Hollice Espy, MD;  Location: [facility] ORS;  Service: Urology;  Laterality: N/A;    Allergies: Patient has no known allergies.  Medications: Prior to Admission medications   Medication Sig Start Date End Date Taking? Authorizing Provider  albuterol (VENTOLIN HFA) 108 (90 Base) MCG/ACT inhaler Inhale 1-2 puffs into the lungs every 4 (four) hours as needed for shortness of breath or wheezing. Patient not taking: Reported on 08/08/2022 03/28/22 03/28/23  [provider]   amLODipine (NORVASC) 10 MG tablet Take 10 mg by mouth daily.  09/03/17   [provider]  aspirin EC 81 MG tablet Take 81 mg by mouth daily.  06/05/17   [provider]  Cholecalciferol (VITAMIN D3) 125 MCG (5000 UT) CAPS Take 5,000 Units by mouth daily.     [provider]  ferrous sulfate 325 (65 FE) MG EC tablet Take 1 tablet by mouth daily with breakfast. 05/16/22   [provider]  fluticasone (FLONASE) 50 MCG/ACT nasal spray Place 1 spray into both nostrils daily as needed for rhinitis or allergies. 07/19/22 07/19/23  [provider]  lidocaine-prilocaine (EMLA) cream Apply to affected area once 08/08/22   Lloyd Huger, MD  losartan (COZAAR) 25 MG tablet Take 1 tablet by mouth daily. 05/30/22 05/30/23  [provider]  metoprolol succinate (TOPROL-XL) 100 MG 24 hr tablet Take 100 mg by mouth daily.  09/03/17   [provider]  Multiple Vitamin (MULTIVITAMIN WITH MINERALS) TABS tablet Take 1 tablet by mouth daily.    [provider]  omeprazole (PRILOSEC OTC) 20 MG tablet Take 20 mg by mouth daily. 07/19/22   [provider]  ondansetron (ZOFRAN) 8 MG tablet Take 1 tablet (8 mg total) by mouth every 8 (eight) hours as needed for nausea or vomiting. Start on the third day after chemotherapy. 08/08/22   Lloyd Huger, MD  potassium chloride SA (KLOR-CON M) 20 MEQ tablet Take 20 mEq by mouth 2 (two) times daily. 10/20/21   [provider]  prochlorperazine (COMPAZINE) 10 MG tablet Take 1 tablet (10 mg total) by mouth every 6 (six)  hours as needed for nausea or vomiting. 08/08/22   Lloyd Huger, MD  simvastatin (ZOCOR) 20 MG tablet Take 20 mg by mouth at bedtime.  09/03/17   [provider]  triamterene-hydrochlorothiazide (MAXZIDE-25) 37.5-25 MG tablet Take 1 tablet by mouth daily.  09/03/17   [provider]     Family History  Problem Relation Age of Onset   Bone cancer Mother     Hypertension Father    Stroke Father     Social History   Socioeconomic History   Marital status: Married    Spouse name: Not on file   Number of children: Not on file   Years of education: Not on file   Highest education level: Not on file  Occupational History   Not on file  Tobacco Use   Smoking status: Never   Smokeless tobacco: Never  Vaping Use   Vaping Use: Never used  Substance and Sexual Activity   Alcohol use: Yes    Comment: occasion   Drug use: Never   Sexual activity: Yes  Other Topics Concern   Not on file  Social History Narrative   Not on file   Social Determinants of Health   Financial Resource Strain: Not on file  Food Insecurity: No Food Insecurity (07/31/2022)   Hunger Vital Sign    Worried About Running Out of Food in the Last Year: Never true    Ran Out of Food in the Last Year: Never true  Transportation Needs: No Transportation Needs (07/31/2022)   PRAPARE - Hydrologist (Medical): No    Lack of Transportation (Non-Medical): No  Physical Activity: Not on file  Stress: Not on file  Social Connections: Not on file    ECOG Status: {CHL ONC ECOG [id number]  Review of Systems: A 12 point ROS discussed and pertinent positives are indicated in the HPI above.  All other systems are negative.  Review of Systems  Vital Signs: There were no vitals taken for this visit.    Physical Exam  Imaging: US BIOPSY (LIVER)  Result Date: 08/06/2022 INDICATION: liver mass EXAM: ULTRASOUND GUIDED LIVER MASS BIOPSY COMPARISON:  CT AP, 07/31/2022. MEDICATIONS: None ANESTHESIA/SEDATION: Moderate (conscious) sedation was employed during this procedure. A total of Versed 1 mg and Fentanyl 50 mcg was administered intravenously. Moderate Sedation Time: 10 minutes. The patient's level of consciousness and vital signs were monitored continuously by radiology nursing throughout the procedure under my direct supervision.  COMPLICATIONS: None immediate. PROCEDURE: Informed written consent was obtained from the patient and/or patient's representative after a discussion of the risks, benefits and alternatives to treatment. The patient understands and consents the procedure. A timeout was performed prior to the initiation of the procedure. Ultrasound scanning was performed of the right upper abdominal quadrant demonstrates multifocal liver masses A LEFT hepatic lobe mass was selected for biopsy and the procedure was planned. The right upper abdominal quadrant was prepped and draped in the usual sterile fashion. The overlying soft tissues were anesthetized with 1% lidocaine with epinephrine. A 17 gauge, 6.8 cm co-axial needle was advanced into a peripheral aspect of the lesion. This was followed by 3 core biopsies with an 18 gauge core device under direct ultrasound guidance. The coaxial needle tract was embolized with a small amount of Gel-Foam slurry and superficial hemostasis was obtained with manual compression. Post procedural scanning was negative for definitive area of hemorrhage or additional complication. A dressing was placed. The patient tolerated the procedure  well without immediate post procedural complication. IMPRESSION: Successful ultrasound guided core needle biopsy of liver mass. Michaelle Birks, MD [facility] Electronically Signed   By: Michaelle Birks M.D.   On: 08/06/2022 14:53   CT ABDOMEN PELVIS W CONTRAST  Result Date: 07/31/2022 CLINICAL DATA:  30 pound weight loss. Increased calcium level. Increased liver enzymes. Elevated white blood cell count. Prostatectomy 4 years ago. * Tracking Code: BO * EXAM: CT ABDOMEN AND PELVIS WITH CONTRAST TECHNIQUE: Multidetector CT imaging of the abdomen and pelvis was performed using the standard protocol following bolus administration of intravenous contrast. RADIATION DOSE REDUCTION: This exam was performed according to  the departmental dose-optimization program which includes automated exposure control, adjustment of the mA and/or kV according to patient size and/or use of iterative reconstruction technique. CONTRAST:  17m OMNIPAQUE IOHEXOL 300 MG/ML  SOLN COMPARISON:  05/29/2022 chest radiograph.  Prostate MRI 03/06/2019. FINDINGS: Lower chest: Bibasilar pulmonary nodules. Example right middle lobe 5 mm nodule on 08/04. Normal heart size without pericardial or pleural effusion. Areas of left-sided pleural-based nodularity versus subpleural lymph nodes. Example 8 mm on 06/02. Hepatobiliary: Innumerable, bilateral liver masses consistent with metastasis. Example posterior right hepatic lobe (segment 7) 3.7 x 3.0 cm on 21/2. Central anterior segment 2 mass measures 3.4 x 3.2 cm on 20/2. No calcified gallstone or biliary duct dilatation. Pancreas: Normal, without mass or ductal dilatation. Spleen: Normal in size, without focal abnormality. Adrenals/Urinary Tract: Normal right adrenal gland. Mild left adrenal thickening and nodularity, including at up to 1.0 cm on 25/2. Interpolar left renal 5.7 cm fluid density lesion is likely a cyst . In the absence of clinically indicated signs/symptoms require(s) no independent follow-up. Normal right kidney. No hydronephrosis. Normal urinary bladder. Stomach/Bowel: Proximal gastric underdistention. Scattered colonic diverticula. Normal terminal ileum and appendix. A loop of markedly thickened jejunum is identified on 57/2 and coronal image 57. Vascular/Lymphatic: Aortic atherosclerosis. Porta hepatis adenopathy including a portacaval 1.6 cm node on 36/2. Small bowel mesenteric adenopathy, centered about the thickened small bowel loop. Example nodes at up to 2.6 x 2.4 cm on 55/2. No pelvic sidewall adenopathy. Reproductive: Prostatectomy, without local recurrence. Other: Small volume abdominopelvic ascites. Extensive peritoneal metastasis. Example left omental implant of 4.2 x 4.5 cm on 47/2.  No free intraperitoneal air. Tiny fat containing right inguinal hernia. Small fat containing paraumbilical hernia. Mild right-sided gynecomastia. Musculoskeletal: Presumably degenerative partial fusion of the bilateral sacroiliac joints. Lumbosacral spondylosis. IMPRESSION: 1. Widespread metastatic disease, including to liver, abdominal nodes, peritoneum, and likely the left pleural space. 2. 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 3. Small bibasilar pulmonary nodules, nonspecific but mildly suspicious for metastatic disease. 4. Small volume abdominopelvic ascites. 5. Prostatectomy, without local recurrence. Distribution of metastatic disease felt unlikely to represent prostate. 6. Indeterminate left adrenal nodule. 7.  Aortic Atherosclerosis (ICD10-I70.0). Electronically Signed   By: KAbigail MiyamotoM.D.   On: 07/31/2022 11:38    Labs:  CBC: Recent Labs    07/31/22 0900 08/01/22 0446 08/06/22 1006  WBC 17.2* 14.3* 18.5*  HGB 10.6* 9.7* 10.7*  HCT 35.6* 32.0* 35.2*  PLT 748* 645* 725*    COAGS: Recent Labs    07/31/22 1302 08/06/22 1006  INR 1.2 1.2  APTT 29  --     BMP: Recent Labs    07/31/22 0906 08/01/22 0446 08/06/22 1006  NA 131* 133* 132*  K 4.1 4.1 4.0  CL  94* 99 98  CO2 '26 28 27  '$ GLUCOSE 104* 99 113*  BUN '14 15 10  '$ CALCIUM 12.9* 11.1* 8.8*  CREATININE 0.89 0.87 0.69  GFRNONAA >60 >60 >60    LIVER FUNCTION TESTS: Recent Labs    07/31/22 0906 08/01/22 0446 08/06/22 1006  BILITOT 2.1* 2.0* 2.9*  AST 94* 89* 149*  ALT 58* 50* 82*  ALKPHOS 310* 287* 352*  PROT 7.8 6.7 6.9  ALBUMIN 2.8* 2.4* 2.5*    TUMOR MARKERS: No results for input(s): "AFPTM", "CEA", "CA199", "CHROMGRNA" in the last 8760 hours.  Assessment and Plan:  62 year old male with history of HTN, GERD, prostate cancer status post prostatectomy presents to IR after stage IV adenocarcinoma diagnosis for  tunneled catheter with port placement for treatment.  Risks and benefits of image guided tunneled catheter with port placement with moderate sedation was discussed with the patient including, but not limited to bleeding, infection, pneumothorax, or fibrin sheath development and need for additional procedures.  All of the patient's questions were answered, patient is agreeable to proceed. Consent signed and in chart.  Thank you for this interesting consult.  I greatly enjoyed meeting Philip Garde. and look forward to participating in their care.  A copy of this report was sent to the requesting provider on this date.  Electronically Signed: Tyson Alias, NP 08/10/2022, 9:06 AM   I spent a total of {New [id number] {New Out-Pt:[id number]}  {Established Out-Pt:[id number]} in face to face in clinical consultation, greater than 50% of which was counseling/coordinating care for adenocarcinoma.

## 2022-08-13 ENCOUNTER — Encounter: Payer: Self-pay | Admitting: Radiology

## 2022-08-13 ENCOUNTER — Ambulatory Visit
Admission: RE | Admit: 2022-08-13 | Discharge: 2022-08-13 | Disposition: A | Discharge status: Home / Self Care | Payer: BC Managed Care – PPO | Source: Ambulatory Visit | Attending: Oncology | Admitting: Oncology

## 2022-08-13 ENCOUNTER — Other Ambulatory Visit: Payer: Self-pay

## 2022-08-13 DIAGNOSIS — C787 Secondary malignant neoplasm of liver and intrahepatic bile duct: Secondary | ICD-10-CM | POA: Insufficient documentation

## 2022-08-13 DIAGNOSIS — C786 Secondary malignant neoplasm of retroperitoneum and peritoneum: Secondary | ICD-10-CM | POA: Insufficient documentation

## 2022-08-13 DIAGNOSIS — R188 Other ascites: Secondary | ICD-10-CM | POA: Insufficient documentation

## 2022-08-13 DIAGNOSIS — C61 Malignant neoplasm of prostate: Secondary | ICD-10-CM | POA: Diagnosis present

## 2022-08-13 HISTORY — PX: IR IMAGING GUIDED PORT INSERTION: IMG5740

## 2022-08-13 MED ORDER — HEPARIN SOD (PORK) LOCK FLUSH 100 UNIT/ML IV SOLN
INTRAVENOUS | Status: AC
  Administered 2022-08-13: 500 [IU]
  Filled 2022-08-13: qty 5

## 2022-08-13 MED ORDER — FENTANYL CITRATE (PF) 100 MCG/2ML IJ SOLN
INTRAMUSCULAR | Status: AC | PRN
  Administered 2022-08-13 (×2): 50 ug via INTRAVENOUS

## 2022-08-13 MED ORDER — MIDAZOLAM HCL 2 MG/2ML IJ SOLN
INTRAMUSCULAR | Status: AC | PRN
  Administered 2022-08-13 (×2): 1 mg via INTRAVENOUS

## 2022-08-13 MED ORDER — SODIUM CHLORIDE 0.9 % IV SOLN: INTRAVENOUS | Status: DC

## 2022-08-13 MED ORDER — LIDOCAINE-EPINEPHRINE 1 %-1:100000 IJ SOLN
INTRAMUSCULAR | Status: AC
  Administered 2022-08-13: 10 mL
  Filled 2022-08-13: qty 1

## 2022-08-13 MED ORDER — MIDAZOLAM HCL 2 MG/2ML IJ SOLN
INTRAMUSCULAR | Status: AC
  Filled 2022-08-13: qty 4

## 2022-08-13 MED ORDER — FENTANYL CITRATE (PF) 100 MCG/2ML IJ SOLN
INTRAMUSCULAR | Status: AC
  Filled 2022-08-13: qty 2

## 2022-08-13 NOTE — Procedures (Signed)
Interventional Radiology Procedure Note  Date of Procedure: 08/13/2022  Procedure: Port placement   Findings:  1. Right chest port placement    Complications: No immediate complications noted.   Estimated Blood Loss: minimal  Follow-up and Recommendations: 1. Ready for use    Albin Felling, MD  Vascular [facility] Interventional Radiology  08/13/2022 3:51 PM

## 2022-08-14 ENCOUNTER — Ambulatory Visit
Admission: RE | Admit: 2022-08-14 | Discharge: 2022-08-14 | Disposition: A | Discharge status: Home / Self Care | Payer: BC Managed Care – PPO | Source: Ambulatory Visit | Attending: Oncology | Admitting: Oncology

## 2022-08-14 ENCOUNTER — Inpatient Hospital Stay: Payer: BC Managed Care – PPO

## 2022-08-14 DIAGNOSIS — C179 Malignant neoplasm of small intestine, unspecified: Secondary | ICD-10-CM

## 2022-08-14 MED ORDER — IOHEXOL 300 MG/ML  SOLN
75.0000 mL | Freq: Once | INTRAMUSCULAR | Status: AC | PRN
  Administered 2022-08-14: 75 mL via INTRAVENOUS

## 2022-08-15 ENCOUNTER — Other Ambulatory Visit: Payer: Self-pay | Admitting: Oncology

## 2022-08-15 ENCOUNTER — Telehealth: Payer: Self-pay | Admitting: *Deleted

## 2022-08-15 DIAGNOSIS — C179 Malignant neoplasm of small intestine, unspecified: Secondary | ICD-10-CM

## 2022-08-15 MED FILL — Dexamethasone Sodium Phosphate Inj 100 MG/10ML: INTRAMUSCULAR | Qty: 1 | Status: AC

## 2022-08-15 NOTE — Telephone Encounter (Signed)
Patient is almost out of pain medicine and insurance is refusing to cover the Tramadol ordered by Dr Edwina Barth. She states she called Dr Tillman Sers office about it and was told that it is an insurance issue and did not offer to order anything else. She states Dr Grayland Ormond offered to order something else for patient if needed so she is asking if he will order something else for pain for patient.

## 2022-08-16 ENCOUNTER — Inpatient Hospital Stay: Payer: BC Managed Care – PPO

## 2022-08-16 ENCOUNTER — Encounter: Payer: Self-pay | Admitting: Oncology

## 2022-08-16 ENCOUNTER — Other Ambulatory Visit: Payer: Self-pay | Admitting: *Deleted

## 2022-08-16 ENCOUNTER — Other Ambulatory Visit: Payer: BC Managed Care – PPO

## 2022-08-16 ENCOUNTER — Inpatient Hospital Stay: Payer: BC Managed Care – PPO | Admitting: Oncology

## 2022-08-16 MED ORDER — OXYCODONE HCL 5 MG PO TABS: 5.0000 mg | ORAL_TABLET | ORAL | 0 refills | Status: DC | PRN

## 2022-08-16 NOTE — Telephone Encounter (Signed)
Philip Arnold notified of prescription sent

## 2022-08-17 ENCOUNTER — Inpatient Hospital Stay: Payer: BC Managed Care – PPO

## 2022-08-18 ENCOUNTER — Telehealth: Payer: Self-pay | Admitting: Oncology

## 2022-08-18 NOTE — Telephone Encounter (Signed)
Wife called and report that patient is getting very weak, he needs assistance going to bathroom.  Decreased appetite, not eating for a few days.  No nausea vomiting, diarrhea, fever or chills. He takes pain medication and pain is better controlled. Wife understands that Philip Arnold weakness is likely due to cancer, she is concerned that patient may not be strong enough to get chemotherapy which is currently planned on Monday 08/20/22. We discussed about the option of going to ER and get evaluated, supportive care. Wife says patient would not want to go to the hospital. Recommend encourage hydration, consider palliative care discussion for goals of care next week.  Cc.Dr.Finnegan

## 2022-08-19 ENCOUNTER — Emergency Department: Payer: BC Managed Care – PPO

## 2022-08-19 ENCOUNTER — Encounter: Payer: Self-pay | Admitting: Emergency Medicine

## 2022-08-19 ENCOUNTER — Other Ambulatory Visit: Payer: Self-pay

## 2022-08-19 ENCOUNTER — Inpatient Hospital Stay
Admission: EM | Admit: 2022-08-19 | Discharge: 2022-08-25 | DRG: 435 | Disposition: A | Discharge status: Hospice | Payer: BC Managed Care – PPO | Attending: Internal Medicine | Admitting: Internal Medicine

## 2022-08-19 DIAGNOSIS — Z515 Encounter for palliative care: Secondary | ICD-10-CM | POA: Diagnosis not present

## 2022-08-19 DIAGNOSIS — D75839 Thrombocytosis, unspecified: Secondary | ICD-10-CM

## 2022-08-19 DIAGNOSIS — I10 Essential (primary) hypertension: Secondary | ICD-10-CM | POA: Diagnosis present

## 2022-08-19 DIAGNOSIS — Z66 Do not resuscitate: Secondary | ICD-10-CM | POA: Diagnosis present

## 2022-08-19 DIAGNOSIS — E875 Hyperkalemia: Secondary | ICD-10-CM | POA: Insufficient documentation

## 2022-08-19 DIAGNOSIS — Z7982 Long term (current) use of aspirin: Secondary | ICD-10-CM

## 2022-08-19 DIAGNOSIS — N179 Acute kidney failure, unspecified: Secondary | ICD-10-CM

## 2022-08-19 DIAGNOSIS — Z7401 Bed confinement status: Secondary | ICD-10-CM

## 2022-08-19 DIAGNOSIS — F32A Depression, unspecified: Secondary | ICD-10-CM | POA: Diagnosis present

## 2022-08-19 DIAGNOSIS — C179 Malignant neoplasm of small intestine, unspecified: Secondary | ICD-10-CM | POA: Diagnosis present

## 2022-08-19 DIAGNOSIS — Z6836 Body mass index (BMI) 36.0-36.9, adult: Secondary | ICD-10-CM | POA: Diagnosis not present

## 2022-08-19 DIAGNOSIS — J9601 Acute respiratory failure with hypoxia: Secondary | ICD-10-CM | POA: Diagnosis present

## 2022-08-19 DIAGNOSIS — E785 Hyperlipidemia, unspecified: Secondary | ICD-10-CM | POA: Diagnosis present

## 2022-08-19 DIAGNOSIS — I5021 Acute systolic (congestive) heart failure: Secondary | ICD-10-CM | POA: Diagnosis not present

## 2022-08-19 DIAGNOSIS — R630 Anorexia: Secondary | ICD-10-CM | POA: Diagnosis present

## 2022-08-19 DIAGNOSIS — R7989 Other specified abnormal findings of blood chemistry: Secondary | ICD-10-CM | POA: Diagnosis present

## 2022-08-19 DIAGNOSIS — E871 Hypo-osmolality and hyponatremia: Secondary | ICD-10-CM | POA: Diagnosis present

## 2022-08-19 DIAGNOSIS — G9341 Metabolic encephalopathy: Secondary | ICD-10-CM | POA: Diagnosis present

## 2022-08-19 DIAGNOSIS — R601 Generalized edema: Secondary | ICD-10-CM | POA: Diagnosis present

## 2022-08-19 DIAGNOSIS — E8770 Fluid overload, unspecified: Secondary | ICD-10-CM

## 2022-08-19 DIAGNOSIS — K767 Hepatorenal syndrome: Secondary | ICD-10-CM | POA: Diagnosis present

## 2022-08-19 DIAGNOSIS — E8809 Other disorders of plasma-protein metabolism, not elsewhere classified: Secondary | ICD-10-CM | POA: Diagnosis present

## 2022-08-19 DIAGNOSIS — C787 Secondary malignant neoplasm of liver and intrahepatic bile duct: Secondary | ICD-10-CM | POA: Diagnosis present

## 2022-08-19 DIAGNOSIS — Z8546 Personal history of malignant neoplasm of prostate: Secondary | ICD-10-CM

## 2022-08-19 DIAGNOSIS — C799 Secondary malignant neoplasm of unspecified site: Secondary | ICD-10-CM

## 2022-08-19 DIAGNOSIS — K219 Gastro-esophageal reflux disease without esophagitis: Secondary | ICD-10-CM | POA: Diagnosis present

## 2022-08-19 DIAGNOSIS — Z87442 Personal history of urinary calculi: Secondary | ICD-10-CM

## 2022-08-19 DIAGNOSIS — C7889 Secondary malignant neoplasm of other digestive organs: Secondary | ICD-10-CM

## 2022-08-19 DIAGNOSIS — M7989 Other specified soft tissue disorders: Secondary | ICD-10-CM | POA: Insufficient documentation

## 2022-08-19 DIAGNOSIS — R188 Other ascites: Secondary | ICD-10-CM | POA: Diagnosis present

## 2022-08-19 DIAGNOSIS — D72829 Elevated white blood cell count, unspecified: Secondary | ICD-10-CM | POA: Diagnosis present

## 2022-08-19 DIAGNOSIS — D630 Anemia in neoplastic disease: Secondary | ICD-10-CM | POA: Diagnosis present

## 2022-08-19 DIAGNOSIS — Z8249 Family history of ischemic heart disease and other diseases of the circulatory system: Secondary | ICD-10-CM

## 2022-08-19 DIAGNOSIS — E669 Obesity, unspecified: Secondary | ICD-10-CM | POA: Diagnosis present

## 2022-08-19 DIAGNOSIS — Z6835 Body mass index (BMI) 35.0-35.9, adult: Secondary | ICD-10-CM

## 2022-08-19 DIAGNOSIS — Z79899 Other long term (current) drug therapy: Secondary | ICD-10-CM

## 2022-08-19 LAB — BASIC METABOLIC PANEL
Anion gap: 9 (ref 5–15)
BUN: 39 mg/dL — ABNORMAL HIGH (ref 8–23)
CO2: 22 mmol/L (ref 22–32)
Calcium: 10.8 mg/dL — ABNORMAL HIGH (ref 8.9–10.3)
Chloride: 94 mmol/L — ABNORMAL LOW (ref 98–111)
Creatinine, Ser: 1.1 mg/dL (ref 0.61–1.24)
GFR, Estimated: >60 mL/min (ref >60)
Glucose, Bld: 101 mg/dL — ABNORMAL HIGH (ref 70–99)
Potassium: 5.4 mmol/L — ABNORMAL HIGH (ref 3.5–5.1)
Sodium: 125 mmol/L — ABNORMAL LOW (ref 135–145)

## 2022-08-19 LAB — LIPASE, BLOOD: Lipase: 64 U/L — ABNORMAL HIGH (ref 11–51)

## 2022-08-19 LAB — CBC
HCT: 35 % — ABNORMAL LOW (ref 39.0–52.0)
Hemoglobin: 10.7 g/dL — ABNORMAL LOW (ref 13.0–17.0)
MCH: 23.9 pg — ABNORMAL LOW (ref 26.0–34.0)
MCHC: 30.6 g/dL (ref 30.0–36.0)
MCV: 78.3 fL — ABNORMAL LOW (ref 80.0–100.0)
Platelets: 811 10*3/uL — ABNORMAL HIGH (ref 150–400)
RBC: 4.47 MIL/uL (ref 4.22–5.81)
RDW: 24.1 % — ABNORMAL HIGH (ref 11.5–15.5)
WBC: 19.9 10*3/uL — ABNORMAL HIGH (ref 4.0–10.5)
nRBC: 0 % (ref 0.0–0.2)

## 2022-08-19 LAB — PHOSPHORUS: Phosphorus: 3 mg/dL (ref 2.5–4.6)

## 2022-08-19 LAB — MAGNESIUM: Magnesium: 2.8 mg/dL — ABNORMAL HIGH (ref 1.7–2.4)

## 2022-08-19 LAB — BRAIN NATRIURETIC PEPTIDE: B Natriuretic Peptide: 41.5 pg/mL (ref 0.0–100.0)

## 2022-08-19 LAB — COMPREHENSIVE METABOLIC PANEL
ALT: 64 U/L — ABNORMAL HIGH (ref 0–44)
AST: 138 U/L — ABNORMAL HIGH (ref 15–41)
Albumin: 2.1 g/dL — ABNORMAL LOW (ref 3.5–5.0)
Alkaline Phosphatase: 372 U/L — ABNORMAL HIGH (ref 38–126)
Anion gap: 12 (ref 5–15)
BUN: 38 mg/dL — ABNORMAL HIGH (ref 8–23)
CO2: 21 mmol/L — ABNORMAL LOW (ref 22–32)
Calcium: 11 mg/dL — ABNORMAL HIGH (ref 8.9–10.3)
Chloride: 91 mmol/L — ABNORMAL LOW (ref 98–111)
Creatinine, Ser: 1.31 mg/dL — ABNORMAL HIGH (ref 0.61–1.24)
GFR, Estimated: >60 mL/min (ref >60)
Glucose, Bld: 122 mg/dL — ABNORMAL HIGH (ref 70–99)
Potassium: 5.6 mmol/L — ABNORMAL HIGH (ref 3.5–5.1)
Sodium: 124 mmol/L — ABNORMAL LOW (ref 135–145)
Total Bilirubin: 5.7 mg/dL — ABNORMAL HIGH (ref 0.3–1.2)
Total Protein: 7.2 g/dL (ref 6.5–8.1)

## 2022-08-19 LAB — PROTIME-INR
INR: 1.4 — ABNORMAL HIGH (ref 0.8–1.2)
Prothrombin Time: 16.8 seconds — ABNORMAL HIGH (ref 11.4–15.2)

## 2022-08-19 LAB — TROPONIN I (HIGH SENSITIVITY)
Troponin I (High Sensitivity): 11 ng/L (ref <18)
Troponin I (High Sensitivity): 10 ng/L (ref <18)

## 2022-08-19 LAB — APTT: aPTT: 37 seconds — ABNORMAL HIGH (ref 24–36)

## 2022-08-19 LAB — OSMOLALITY: Osmolality: 278 mOsm/kg (ref 275–295)

## 2022-08-19 LAB — TSH: TSH: 4.507 u[IU]/mL — ABNORMAL HIGH (ref 0.350–4.500)

## 2022-08-19 MED ORDER — SIMVASTATIN 20 MG PO TABS
20.0000 mg | ORAL_TABLET | Freq: Every day | ORAL | Status: DC
  Administered 2022-08-20 – 2022-08-24 (×6): 20 mg via ORAL
  Filled 2022-08-19 (×6): qty 1

## 2022-08-19 MED ORDER — ZOLEDRONIC ACID 4 MG/5ML IV CONC
4.0000 mg | Freq: Once | INTRAVENOUS | Status: AC
  Administered 2022-08-20: 4 mg via INTRAVENOUS
  Filled 2022-08-19: qty 5

## 2022-08-19 MED ORDER — SODIUM CHLORIDE 0.9 % IV SOLN
2.0000 g | Freq: Once | INTRAVENOUS | Status: AC
  Administered 2022-08-19: 2 g via INTRAVENOUS
  Filled 2022-08-19: qty 20

## 2022-08-19 MED ORDER — ENOXAPARIN SODIUM 60 MG/0.6ML IJ SOSY
0.5000 mg/kg | PREFILLED_SYRINGE | INTRAMUSCULAR | Status: DC
  Administered 2022-08-20: 57.5 mg via SUBCUTANEOUS
  Filled 2022-08-19: qty 0.6

## 2022-08-19 MED ORDER — OXYCODONE HCL 5 MG PO TABS
5.0000 mg | ORAL_TABLET | ORAL | Status: DC | PRN
  Administered 2022-08-22: 5 mg via ORAL
  Filled 2022-08-19: qty 1

## 2022-08-19 MED ORDER — METOPROLOL SUCCINATE ER 50 MG PO TB24
100.0000 mg | ORAL_TABLET | Freq: Every day | ORAL | Status: DC
  Administered 2022-08-20 – 2022-08-25 (×6): 100 mg via ORAL
  Filled 2022-08-19 (×6): qty 2

## 2022-08-19 MED ORDER — ASPIRIN 81 MG PO TBEC
81.0000 mg | DELAYED_RELEASE_TABLET | Freq: Every day | ORAL | Status: DC
  Administered 2022-08-20 – 2022-08-25 (×6): 81 mg via ORAL
  Filled 2022-08-19 (×6): qty 1

## 2022-08-19 MED ORDER — OMEPRAZOLE MAGNESIUM 20 MG PO TBEC: 20.0000 mg | DELAYED_RELEASE_TABLET | Freq: Every day | ORAL | Status: DC

## 2022-08-19 MED ORDER — ALBUTEROL SULFATE (2.5 MG/3ML) 0.083% IN NEBU
2.5000 mg | INHALATION_SOLUTION | RESPIRATORY_TRACT | Status: DC | PRN
  Administered 2022-08-21: 2.5 mg via RESPIRATORY_TRACT
  Filled 2022-08-19 (×2): qty 3

## 2022-08-19 MED ORDER — METRONIDAZOLE 500 MG/100ML IV SOLN
500.0000 mg | Freq: Once | INTRAVENOUS | Status: AC
  Administered 2022-08-19: 500 mg via INTRAVENOUS
  Filled 2022-08-19: qty 100

## 2022-08-19 MED ORDER — CALCITONIN (SALMON) 200 UNIT/ML IJ SOLN
400.0000 [IU] | Freq: Two times a day (BID) | INTRAMUSCULAR | Status: AC
  Administered 2022-08-20 (×2): 400 [IU] via SUBCUTANEOUS
  Filled 2022-08-19 (×2): qty 2

## 2022-08-19 MED ORDER — ZOLEDRONIC ACID 4 MG/100ML IV SOLN
4.0000 mg | Freq: Once | INTRAVENOUS | Status: DC
  Filled 2022-08-19: qty 100

## 2022-08-19 MED ORDER — PROCHLORPERAZINE MALEATE 10 MG PO TABS: 10.0000 mg | ORAL_TABLET | Freq: Four times a day (QID) | ORAL | Status: DC | PRN

## 2022-08-19 MED ORDER — VITAMIN D 25 MCG (1000 UNIT) PO TABS
5000.0000 [IU] | ORAL_TABLET | Freq: Every day | ORAL | Status: DC
  Administered 2022-08-20 – 2022-08-25 (×6): 5000 [IU] via ORAL
  Filled 2022-08-19 (×6): qty 5

## 2022-08-19 MED ORDER — IOHEXOL 350 MG/ML SOLN
100.0000 mL | Freq: Once | INTRAVENOUS | Status: AC | PRN
  Administered 2022-08-19: 100 mL via INTRAVENOUS

## 2022-08-19 MED ORDER — SODIUM CHLORIDE 0.9% FLUSH
3.0000 mL | Freq: Two times a day (BID) | INTRAVENOUS | Status: DC
  Administered 2022-08-20 – 2022-08-25 (×11): 3 mL via INTRAVENOUS

## 2022-08-19 MED ORDER — PANTOPRAZOLE SODIUM 40 MG PO TBEC
40.0000 mg | DELAYED_RELEASE_TABLET | Freq: Every day | ORAL | Status: DC
  Administered 2022-08-20 – 2022-08-25 (×6): 40 mg via ORAL
  Filled 2022-08-19 (×6): qty 1

## 2022-08-19 NOTE — Assessment & Plan Note (Signed)
This is at least moderate as calcium corrects to 12.8 mg/dL.  I am going to hold off on further IV fluids as patient does seem to be already slightly fluid overloaded.  We will treat this by checking PTH vitamin D and ionized calcium level.  Patient has been hypercalcemic in the past as well based on record review.  We will start the patient on calcitonin for more immediate control of calcium and also 1 dose of zoledronic acid.  Patient may require vitamin D supplementation if found to be deficient in same

## 2022-08-19 NOTE — Assessment & Plan Note (Signed)
Check urine sodium and creatinine my impression is that this is due to hypercalcemia.  I do not think patient is a in a prerenal state right now, patient has been drinking ample fluids at home reports no diarrhea vomiting or fever.  Reports decent urine output.  Check urinalysis.

## 2022-08-19 NOTE — Assessment & Plan Note (Addendum)
There has been a progression of patient's liver disease compared to previous baseline labs available.  I believe this is due to worsening of patient's to adeno cancer met to liver as reported by radiologist.  I have requested Dr. Grayland Ormond to kindly evaluate patient in the a.m.  At this time we will check INR PTT for DVT prophylaxis

## 2022-08-19 NOTE — Assessment & Plan Note (Signed)
I will hold the patient's losartan, I will hold the patient's oral potassium supplementation, I will give the patient a renal diet.  There are several contributory factors to this including patient's AKI, see below

## 2022-08-19 NOTE — Assessment & Plan Note (Signed)
There are no DVT on ultrasound, I believe this is likely due to generalized fluid overload as it is associated with slight crackles in the lung, ascites as well as leg swelling.  See fluid overload below

## 2022-08-19 NOTE — Assessment & Plan Note (Signed)
Heme-onc evaluation in the morning

## 2022-08-19 NOTE — ED Provider Notes (Signed)
[facility] Provider Note    Event Date/Time   First MD Initiated Contact with Patient 08/19/22 1600     (approximate)   History   Chief Complaint: Leg Swelling and Shortness of Breath   HPI  Numan Luongo. is a 62 y.o. male with a history of hypertension, liver cancer that was recently diagnosed, scheduled to start chemotherapy treatment tomorrow who notes bilateral leg swelling and shortness of breath since yesterday.  No cough congestion or chest pain.  Shortness of breath is worse with standing and walking.  Got very winded at home trying to ambulate from the bathroom to the bed.  Also complains of worsening abdominal distention, generalized abdominal pain, and no bowel movements for several days.  No falls or trauma, no headache, no neck pain or stiffness, no fever     Physical Exam   Triage Vital Signs: ED Triage Vitals  Enc Vitals Group     BP 08/19/22 1554 108/61     Pulse Rate 08/19/22 1554 93     Resp 08/19/22 1554 20     Temp 08/19/22 1554 98.7 F (37.1 C)     Temp src --      SpO2 08/19/22 1554 99 %     Weight 08/19/22 1552 248 lb (112.5 kg)     Height 08/19/22 1552 '5\' 10"'$  (1.778 m)     Head Circumference --      Peak Flow --      Pain Score 08/19/22 1552 7     Pain Loc --      Pain Edu? --      Excl. in [place]? --     Most recent vital signs: Vitals:   08/19/22 1715 08/19/22 1800  BP:  103/65  Pulse: 85 84  Resp: 17   Temp:    SpO2: 98% 99%    General: Awake, no distress.  CV:  Good peripheral perfusion.  Regular rate and rhythm, normal distal pulses Resp:  Tachypnea with respiratory rate of 25.  Lungs clear to auscultation bilaterally.  No wheezing or crackles Abd:  Moderately distended.  Diffuse upper abdominal tenderness.  Tympany to percussion Other:  2+ pitting edema bilateral lower extremities, symmetric.   ED Results / Procedures / Treatments   Labs (all labs ordered are listed, but only abnormal results are  displayed) Labs Reviewed  CBC - Abnormal; Notable for the following components:      Result Value   WBC 19.9 (*)    Hemoglobin 10.7 (*)    HCT 35.0 (*)    MCV 78.3 (*)    MCH 23.9 (*)    RDW 24.1 (*)    Platelets 811 (*)    All other components within normal limits  COMPREHENSIVE METABOLIC PANEL - Abnormal; Notable for the following components:   Sodium 124 (*)    Potassium 5.6 (*)    Chloride 91 (*)    CO2 21 (*)    Glucose, Bld 122 (*)    BUN 38 (*)    Creatinine, Ser 1.31 (*)    Calcium 11.0 (*)    Albumin 2.1 (*)    AST 138 (*)    ALT 64 (*)    Alkaline Phosphatase 372 (*)    Total Bilirubin 5.7 (*)    All other components within normal limits  LIPASE, BLOOD - Abnormal; Notable for the following components:   Lipase 64 (*)    All other components within normal limits  MAGNESIUM - Abnormal; Notable for the following components:   Magnesium 2.8 (*)    All other components within normal limits  TSH - Abnormal; Notable for the following components:   TSH 4.507 (*)    All other components within normal limits  BRAIN NATRIURETIC PEPTIDE  PHOSPHORUS  TROPONIN I (HIGH SENSITIVITY)  TROPONIN I (HIGH SENSITIVITY)     EKG Interpreted by me Sinus rhythm rate of 88.  Left axis, normal intervals.  Poor R wave progression.  Voltage criteria for LVH and high lateral leads.  Normal ST segments and T waves.   RADIOLOGY Chest x-ray interpreted by me, appears unremarkable.  Radiology report reviewed   PROCEDURES:  Procedures   MEDICATIONS ORDERED IN ED: Medications  metroNIDAZOLE (FLAGYL) IVPB 500 mg (500 mg Intravenous New Bag/Given 08/19/22 1859)  cefTRIAXone (ROCEPHIN) 2 g in sodium chloride 0.9 % 100 mL IVPB (0 g Intravenous Stopped 08/19/22 1901)  iohexol (OMNIPAQUE) 350 MG/ML injection 100 mL (100 mLs Intravenous Contrast Given 08/19/22 1827)     IMPRESSION / MDM / ASSESSMENT AND PLAN / ED COURSE  I reviewed the triage vital signs and the nursing notes.  DDx:  Anasarca, hypothyroidism, bowel obstruction, gastritis, non-STEMI, acute heart failure, biliary obstruction, pancreatitis, lower extremity DVT, pulmonary embolism  Patient's presentation is most consistent with acute presentation with potential threat to life or bodily function.  Patient presents with abdominal pain, lower extremity swelling, shortness of breath.  Will need to obtain labs, lower extremity ultrasounds, CT chest abdomen pelvis.  ----------------------------------------- 7:33 PM on 08/19/2022 ----------------------------------------- CT chest abdomen pelvis negative for any focal findings.  No PE.  He does have widespread abdominal metastatic disease of the omentum and liver with suspected primary source from the small intestine.  Chemistry panel shows AKI, hyponatremia.  LFTs are roughly at baseline.  Case discussed with hospitalist for further management.       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   Carrie Mew, MD 08/19/22 [phone]

## 2022-08-19 NOTE — Assessment & Plan Note (Signed)
Due to AKI, trend maintain on telemetry

## 2022-08-19 NOTE — Assessment & Plan Note (Signed)
Check free T4 in the a.m., I anticipate sick euthyroid syndrome

## 2022-08-19 NOTE — Assessment & Plan Note (Addendum)
Check echo, urine protein and creatinine.  However my impression is that this is likely due to liver disease as it is associated with prominent ascites.  At this time I will not start the patient on diuresis as we are concerned about fixing the hypercalcemia first.  And associated AKI.  Therefore just maintain the patient on fluid restriction and monitor at this time. With intent to diurese soon after.

## 2022-08-19 NOTE — Progress Notes (Signed)
Anticoagulation monitoring(Lovenox):   62 yo male ordered Lovenox 40 mg Q24h    Filed Weights   08/19/22 1552 08/19/22 2144  Weight: 112.5 kg (248 lb) 116.8 kg (257 lb 8 oz)   BMI 35   Lab Results  Component Value Date   CREATININE 1.31 (H) 08/19/2022   CREATININE 0.69 08/06/2022   CREATININE 0.87 08/01/2022   Estimated Creatinine Clearance: 76 mL/min (A) (by C-G formula based on SCr of 1.31 mg/dL (H)). Hemoglobin & Hematocrit     Component Value Date/Time   HGB 10.7 (L) 08/19/2022 1656   HCT 35.0 (L) 08/19/2022 1656     Per Protocol for Patient with estCrcl > 30 ml/min and BMI > 30, will transition to Lovenox 57.5 mg Q24h.

## 2022-08-19 NOTE — Assessment & Plan Note (Signed)
This seems to be chronic but again progressive.  During my evaluation I did not encounter any focal signs of infection.  Obviously we still have the urinalysis pending.  At this time patient has received ceftriaxone and metronidazole in the ER, I did discuss with ER attending and his suspicion for a focal infection was also not high.  Regardless given patient's immunocompromise I will draw blood culture at this time.  And monitor patient.  I will not give him further antibiotics at this time

## 2022-08-19 NOTE — Assessment & Plan Note (Addendum)
Moderate, check serum osmolality.  I will maintain the patient on fluid restriction patient reports drinking ample fluids at home this is corroborated by documentation of patient being advised to drink ample fluids at home.  Check urine sodium and creatinine hold hydrochlorothiazide

## 2022-08-19 NOTE — ED Triage Notes (Signed)
Pt via POV from home. Pt has a hx of liver cancer and suppose to start treatment tomorrow. States that he has had bilateral leg swelling and SOB since yesterday. Denies cough/congestion. Denies hx of heart failure. Pt reports generalized abd pain also. Denies NVD. Pt is A&OX4 and NAD

## 2022-08-19 NOTE — H&P (Addendum)
History and Physical    Patient: Philip Arnold. IK:[id number] DOB: 30-Jan-1961 DOA: 08/19/2022 DOS: the patient was seen and examined on 08/19/2022 PCP: [name], MD  Patient coming from: Home  Chief Complaint:  Chief Complaint  Patient presents with   Leg Swelling   Shortness of Breath   HPI: Philip Arnold. is a 62 y.o. 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  Patient is actually pending initiation of treatment/chemotherapy with his oncologist.  However over the last 3 4 days patient has had generalized weakness, poor appetite as well as development of lower extremity swelling and abdominal distention.  Patient also reports exertional shortness of breath.  There is no chest pain abdominal pain no fever no coughing no diarrhea no vomiting.  Patient came to the ER because of her declining functional status and is accompanied by his wife today.  At the time of this encounter patient reports no specific complaints and is comfortable in the stretcher although he has the chronic complaints as mentioned above. Review of Systems: As mentioned in the history of present illness. All other systems reviewed and are negative. Past Medical History:  Diagnosis Date   Cancer [facility])    Prostate Cancer   Elevated PSA    GERD (gastroesophageal reflux disease)    Hip pain    History of kidney stones    Hyperglycemia    Hypertension    Obesity    Past Surgical History:  Procedure Laterality Date   COLONOSCOPY WITH PROPOFOL     IR IMAGING GUIDED PORT INSERTION  08/13/2022   PELVIC LYMPH NODE DISSECTION Bilateral 04/24/2019   Procedure: PELVIC LYMPH NODE DISSECTION;  Surgeon: Hollice Espy, MD;  Location: [facility] ORS;  Service: Urology;  Laterality: Bilateral;   ROBOT ASSISTED LAPAROSCOPIC RADICAL PROSTATECTOMY N/A 04/24/2019   Procedure: XI ROBOTIC ASSISTED LAPAROSCOPIC RADICAL PROSTATECTOMY;  Surgeon: Hollice Espy, MD;  Location: [facility] ORS;  Service: Urology;  Laterality: N/A;   Social History:  reports that he has never smoked. He has never used smokeless tobacco. He reports that he does not currently use alcohol. He reports that he does not use drugs.  No Known Allergies  Family History  Problem Relation Age of Onset   Bone cancer Mother    Hypertension Father    Stroke Father     Prior to Admission medications   Medication Sig Start Date End Date Taking? Authorizing Provider  albuterol (VENTOLIN HFA) 108 (90 Base) MCG/ACT inhaler Inhale 1-2 puffs into the lungs every 4 (four) hours as needed for shortness of breath or wheezing. Patient not taking: Reported on 08/08/2022 03/28/22 03/28/23  [provider]  amLODipine (NORVASC) 10 MG tablet Take 10 mg by mouth daily.  09/03/17   [provider]  aspirin EC 81 MG tablet Take 81 mg by mouth daily.  06/05/17   [provider]  Cholecalciferol (VITAMIN D3) 125 MCG (5000 UT) CAPS Take 5,000 Units by mouth daily.     [provider]  ferrous sulfate 325 (65 FE) MG EC tablet Take 1 tablet by mouth daily with breakfast. 05/16/22   [provider]  fluticasone (FLONASE) 50 MCG/ACT nasal spray Place 1 spray into both nostrils daily as needed for rhinitis or allergies. 07/19/22 07/19/23  [provider]  lidocaine-prilocaine (EMLA) cream Apply to affected area once 08/08/22   Lloyd Huger, MD  losartan (COZAAR) 25 MG tablet  Take 1 tablet by mouth daily. 05/30/22 05/30/23  [provider]  metoprolol succinate (TOPROL-XL) 100 MG 24 hr tablet Take 100 mg by mouth daily.  09/03/17   [provider]  Multiple Vitamin (MULTIVITAMIN WITH MINERALS) TABS tablet Take 1 tablet by mouth daily.    [provider]  omeprazole (PRILOSEC OTC) 20 MG tablet Take 20 mg by mouth daily. 07/19/22   [provider]  ondansetron (ZOFRAN) 8 MG tablet Take 1 tablet (8 mg total) by mouth  every 8 (eight) hours as needed for nausea or vomiting. Start on the third day after chemotherapy. 08/08/22   Lloyd Huger, MD  oxyCODONE (OXY IR/ROXICODONE) 5 MG immediate release tablet Take 1 tablet (5 mg total) by mouth every 4 (four) hours as needed for severe pain. 08/16/22   Lloyd Huger, MD  potassium chloride SA (KLOR-CON M) 20 MEQ tablet Take 20 mEq by mouth 2 (two) times daily. 10/20/21   [provider]  prochlorperazine (COMPAZINE) 10 MG tablet Take 1 tablet (10 mg total) by mouth every 6 (six) hours as needed for nausea or vomiting. 08/08/22   Lloyd Huger, MD  simvastatin (ZOCOR) 20 MG tablet Take 20 mg by mouth at bedtime.  09/03/17   [provider]  triamterene-hydrochlorothiazide (MAXZIDE-25) 37.5-25 MG tablet Take 1 tablet by mouth daily.  09/03/17   [provider]    Physical Exam: Vitals:   08/19/22 1554 08/19/22 1715 08/19/22 1800 08/19/22 1930  BP: 108/61  103/65 109/64  Pulse: 93 85 84 88  Resp: 20 17  (!) 24  Temp: 98.7 F (37.1 C)     SpO2: 99% 98% 99% 99%  Weight:      Height:       Patient is on supplemental oxygen although patient has not been hypoxic here in the ER, in general patient is not talking much and defers most of the talking to his wife, although patient is oriented.  Also patient appears to be fatigued/apathetic but no distress at this time. Neurologic exam: Patient is coherent and gives a good account of history to the extent he talks, no evidence of disorientation no focal motor deficit Respiratory exam: Scant right lower lung field crackles otherwise clear Cardiovascular exam S1-S2 normal Abdomen distended but soft Extremities warm with 1-2+ edema up to tibia on bilateral lower extremities Skin without rash Eyes trace icterus present Data Reviewed:    Labs on Admission:  Results for orders placed or performed during the hospital encounter of 08/19/22 (from the past 24 hour(s))  CBC     Status:  Abnormal   Collection Time: 08/19/22  4:56 PM  Result Value Ref Range   WBC 19.9 (H) 4.0 - 10.5 K/uL   RBC 4.47 4.22 - 5.81 MIL/uL   Hemoglobin 10.7 (L) 13.0 - 17.0 g/dL   HCT 35.0 (L) 39.0 - 52.0 %   MCV 78.3 (L) 80.0 - 100.0 fL   MCH 23.9 (L) 26.0 - 34.0 pg   MCHC 30.6 30.0 - 36.0 g/dL   RDW 24.1 (H) 11.5 - 15.5 %   Platelets 811 (H) 150 - 400 K/uL   nRBC 0.0 0.0 - 0.2 %  Troponin I (High Sensitivity)     Status: None   Collection Time: 08/19/22  4:56 PM  Result Value Ref Range   Troponin I (High Sensitivity) 11 <18 ng/L  Comprehensive metabolic panel     Status: Abnormal   Collection Time: 08/19/22  4:56 PM  Result Value Ref Range   Sodium 124 (L) 135 - 145 mmol/L   Potassium 5.6 (H) 3.5 - 5.1 mmol/L   Chloride 91 (L) 98 - 111 mmol/L   CO2 21 (L) 22 - 32 mmol/L   Glucose, Bld 122 (H) 70 - 99 mg/dL   BUN 38 (H) 8 - 23 mg/dL   Creatinine, Ser 1.31 (H) 0.61 - 1.24 mg/dL   Calcium 11.0 (H) 8.9 - 10.3 mg/dL   Total Protein 7.2 6.5 - 8.1 g/dL   Albumin 2.1 (L) 3.5 - 5.0 g/dL   AST 138 (H) 15 - 41 U/L   ALT 64 (H) 0 - 44 U/L   Alkaline Phosphatase 372 (H) 38 - 126 U/L   Total Bilirubin 5.7 (H) 0.3 - 1.2 mg/dL   GFR, Estimated >60 >60 mL/min   Anion gap 12 5 - 15  Brain natriuretic peptide     Status: None   Collection Time: 08/19/22  4:56 PM  Result Value Ref Range   B Natriuretic Peptide 41.5 0.0 - 100.0 pg/mL  Lipase, blood     Status: Abnormal   Collection Time: 08/19/22  4:57 PM  Result Value Ref Range   Lipase 64 (H) 11 - 51 U/L  Magnesium     Status: Abnormal   Collection Time: 08/19/22  4:57 PM  Result Value Ref Range   Magnesium 2.8 (H) 1.7 - 2.4 mg/dL  Phosphorus     Status: None   Collection Time: 08/19/22  4:57 PM  Result Value Ref Range   Phosphorus 3.0 2.5 - 4.6 mg/dL  TSH     Status: Abnormal   Collection Time: 08/19/22  5:06 PM  Result Value Ref Range   TSH 4.507 (H) 0.350 - 4.500 uIU/mL  Troponin I (High Sensitivity)     Status: None   Collection  Time: 08/19/22  6:00 PM  Result Value Ref Range   Troponin I (High Sensitivity) 10 <18 ng/L   Basic Metabolic Panel:  Liver Function Tests:   No results for input(s): "AMMONIA" in the last 168 hours. CBC:  Cardiac Enzymes: Recent Labs  Lab 08/19/22 1656 08/19/22 1800  TROPONINIHS 11 10    BNP (last 3 results) No results for input(s): "PROBNP" in the last 8760 hours. CBG: No results for input(s): "GLUCAP" in the last 168 hours.  Radiological Exams on Admission:  CT Angio Chest PE W and/or Wo Contrast  Result Date: 08/19/2022 CLINICAL DATA:  Abdominal pain and chest pain. Concern for pulmonary embolism and bowel obstruction. Metastatic adenocarcinoma of small-bowel. EXAM: CT ANGIOGRAPHY CHEST CT ABDOMEN AND PELVIS WITH CONTRAST TECHNIQUE: Multidetector CT imaging of the chest was performed using the standard protocol during bolus administration of intravenous contrast. Multiplanar CT image reconstructions and MIPs were obtained to evaluate the vascular anatomy. Multidetector CT imaging of the abdomen and pelvis was performed using the standard protocol during bolus administration of intravenous contrast. RADIATION DOSE REDUCTION: This exam was performed according to the departmental dose-optimization program which includes automated exposure control, adjustment of the mA and/or kV according to patient size and/or use of iterative reconstruction technique. CONTRAST:  166m OMNIPAQUE IOHEXOL 350 MG/ML SOLN COMPARISON:  Chest radiograph dated 08/19/2022 and CT dated 08/14/2022. CT abdomen pelvis dated 07/31/2022. FINDINGS: Evaluation of this exam is limited due to respiratory motion artifact. CTA CHEST FINDINGS Cardiovascular: There is no cardiomegaly or pericardial effusion. The thoracic aorta is unremarkable. Evaluation of the pulmonary arteries is limited due to respiratory motion and suboptimal visualization of  the peripheral branches. No central pulmonary artery embolus identified.  Mediastinum/Nodes: No hilar adenopathy. Mildly rounded subcarinal lymph node measures 8 mm short axis. The esophagus is grossly unremarkable. No mediastinal fluid collection. Right-sided Port-A-Cath with tip at the cavoatrial junction. Lungs/Pleura: Shallow inspiration. There are bibasilar linear and streaky atelectasis. Multiple bilateral pulmonary nodules measuring up to 8 mm in the upper pole right upper lobe in keeping with known metastatic disease. Trace right pleural effusion. No pneumothorax. The central airways are patent. Musculoskeletal: Degenerative changes of the spine. No acute osseous pathology. Review of the MIP images confirms the above findings. CT ABDOMEN and PELVIS FINDINGS No intra-abdominal free air. Small ascites, increased since the prior CT. Hepatobiliary: Multiple (greater than 20) hepatic hypodense metastatic disease similar or progressed since the prior CT. No calcified gallstone. Pancreas: The pancreas is unremarkable as visualized. Spleen: Normal in size without focal abnormality. Adrenals/Urinary Tract: The adrenal glands unremarkable. There is no hydronephrosis on either side. There is a 7 cm cyst in the upper pole of the left kidney. There is symmetric enhancement and excretion of contrast by both kidneys. The visualized ureters and the urinary bladder is unremarkable. Stomach/Bowel: Ill-defined thickening of the small bowel loop in the mid abdomen as seen on the prior CT in keeping with known malignancy. There is no bowel obstruction. The appendix is normal. Vascular/Lymphatic: Mild aortoiliac atherosclerotic disease. The IVC is unremarkable. No portal venous gas. Periportal adenopathy measure approximately 19 mm in short axis. There is extensive omental nodularity, progressed since the prior CT. An omental mass in the left anterior abdomen measures 8.8 x 3.3 cm. Overall increase in the omental implants since the prior CT. Reproductive: Prostatectomy. Other: Mild subcutaneous edema.  Musculoskeletal: Degenerative changes.  No acute osseous pathology. Review of the MIP images confirms the above findings. IMPRESSION: 1. No acute intrathoracic pathology. No central pulmonary artery embolus identified. 2. Multiple bilateral pulmonary nodules in keeping with known metastatic disease and relatively similar to prior CT of 08/14/2022. 3. Progression of metastatic disease in the abdomen and pelvis since the CT of 07/31/2022. There is increased ascites and progression of omental caking. 4. Ill-defined thickening of the small bowel loop in the mid abdomen in keeping with known malignancy. No bowel obstruction. Normal appendix. 5. Hepatic metastatic disease, similar or progressed since the prior CT. 6.  Aortic Atherosclerosis (ICD10-I70.0). Electronically Signed   By: Anner Crete M.D.   On: 08/19/2022 19:05   CT ABDOMEN PELVIS W CONTRAST  Result Date: 08/19/2022 CLINICAL DATA:  Abdominal pain and chest pain. Concern for pulmonary embolism and bowel obstruction. Metastatic adenocarcinoma of small-bowel. EXAM: CT ANGIOGRAPHY CHEST CT ABDOMEN AND PELVIS WITH CONTRAST TECHNIQUE: Multidetector CT imaging of the chest was performed using the standard protocol during bolus administration of intravenous contrast. Multiplanar CT image reconstructions and MIPs were obtained to evaluate the vascular anatomy. Multidetector CT imaging of the abdomen and pelvis was performed using the standard protocol during bolus administration of intravenous contrast. RADIATION DOSE REDUCTION: This exam was performed according to the departmental dose-optimization program which includes automated exposure control, adjustment of the mA and/or kV according to patient size and/or use of iterative reconstruction technique. CONTRAST:  194m OMNIPAQUE IOHEXOL 350 MG/ML SOLN COMPARISON:  Chest radiograph dated 08/19/2022 and CT dated 08/14/2022. CT abdomen pelvis dated 07/31/2022. FINDINGS: Evaluation of this exam is limited due to  respiratory motion artifact. CTA CHEST FINDINGS Cardiovascular: There is no cardiomegaly or pericardial effusion. The thoracic aorta is unremarkable. Evaluation of the pulmonary arteries  is limited due to respiratory motion and suboptimal visualization of the peripheral branches. No central pulmonary artery embolus identified. Mediastinum/Nodes: No hilar adenopathy. Mildly rounded subcarinal lymph node measures 8 mm short axis. The esophagus is grossly unremarkable. No mediastinal fluid collection. Right-sided Port-A-Cath with tip at the cavoatrial junction. Lungs/Pleura: Shallow inspiration. There are bibasilar linear and streaky atelectasis. Multiple bilateral pulmonary nodules measuring up to 8 mm in the upper pole right upper lobe in keeping with known metastatic disease. Trace right pleural effusion. No pneumothorax. The central airways are patent. Musculoskeletal: Degenerative changes of the spine. No acute osseous pathology. Review of the MIP images confirms the above findings. CT ABDOMEN and PELVIS FINDINGS No intra-abdominal free air. Small ascites, increased since the prior CT. Hepatobiliary: Multiple (greater than 20) hepatic hypodense metastatic disease similar or progressed since the prior CT. No calcified gallstone. Pancreas: The pancreas is unremarkable as visualized. Spleen: Normal in size without focal abnormality. Adrenals/Urinary Tract: The adrenal glands unremarkable. There is no hydronephrosis on either side. There is a 7 cm cyst in the upper pole of the left kidney. There is symmetric enhancement and excretion of contrast by both kidneys. The visualized ureters and the urinary bladder is unremarkable. Stomach/Bowel: Ill-defined thickening of the small bowel loop in the mid abdomen as seen on the prior CT in keeping with known malignancy. There is no bowel obstruction. The appendix is normal. Vascular/Lymphatic: Mild aortoiliac atherosclerotic disease. The IVC is unremarkable. No portal venous  gas. Periportal adenopathy measure approximately 19 mm in short axis. There is extensive omental nodularity, progressed since the prior CT. An omental mass in the left anterior abdomen measures 8.8 x 3.3 cm. Overall increase in the omental implants since the prior CT. Reproductive: Prostatectomy. Other: Mild subcutaneous edema. Musculoskeletal: Degenerative changes.  No acute osseous pathology. Review of the MIP images confirms the above findings. IMPRESSION: 1. No acute intrathoracic pathology. No central pulmonary artery embolus identified. 2. Multiple bilateral pulmonary nodules in keeping with known metastatic disease and relatively similar to prior CT of 08/14/2022. 3. Progression of metastatic disease in the abdomen and pelvis since the CT of 07/31/2022. There is increased ascites and progression of omental caking. 4. Ill-defined thickening of the small bowel loop in the mid abdomen in keeping with known malignancy. No bowel obstruction. Normal appendix. 5. Hepatic metastatic disease, similar or progressed since the prior CT. 6.  Aortic Atherosclerosis (ICD10-I70.0). Electronically Signed   By: Anner Crete M.D.   On: 08/19/2022 19:05   US Venous Img Lower Bilateral  Result Date: 08/19/2022 CLINICAL DATA:  BLE edema EXAM: BILATERAL LOWER EXTREMITY VENOUS DOPPLER ULTRASOUND TECHNIQUE: Gray-scale sonography with compression, as well as color and duplex ultrasound, were performed to evaluate the deep venous system(s) from the level of the common femoral vein through the popliteal and proximal calf veins. COMPARISON:  None Available. FINDINGS: VENOUS Normal compressibility of the common femoral, superficial femoral, and popliteal veins, as well as the visualized calf veins. Visualized portions of profunda femoral vein and great saphenous vein unremarkable. No filling defects to suggest DVT on grayscale or color Doppler imaging. Doppler waveforms show normal direction of venous flow, normal respiratory  plasticity and response to augmentation. OTHER None. Limitations: none IMPRESSION: Negative. Electronically Signed   By: Valentino Saxon M.D.   On: 08/19/2022 17:49   DG Chest 2 View  Result Date: 08/19/2022 CLINICAL DATA:  History of liver cancer.  Bilateral leg swelling EXAM: CHEST - 2 VIEW COMPARISON:  CT of the chest August 14, 2022 FINDINGS: Bibasilar atelectasis remains. A right Port-A-Cath is in good position. No pneumothorax. The cardiomediastinal silhouette is stable. No other suspicious findings. IMPRESSION: Bibasilar atelectasis. No other acute abnormalities. Electronically Signed   By: Dorise Bullion III M.D.   On: 08/19/2022 16:23    EKG: Independently reviewed. Sinus rhythm.   Assessment and Plan: * Fluid overload Check echo, urine protein and creatinine.  However my impression is that this is likely due to liver disease as it is associated with prominent ascites.  At this time I will not start the patient on diuresis as we are concerned about fixing the hypercalcemia first.  And associated AKI.  Therefore just maintain the patient on fluid restriction and monitor at this time. With intent to diurese soon after.  Hypercalcemia This is at least moderate as calcium corrects to 12.8 mg/dL.  I am going to hold off on further IV fluids as patient does seem to be already slightly fluid overloaded.  We will treat this by checking PTH vitamin D and ionized calcium level.  Patient has been hypercalcemic in the past as well based on record review.  We will start the patient on calcitonin for more immediate control of calcium and also 1 dose of zoledronic acid.  Patient may require vitamin D supplementation if found to be deficient in same  Abnormal LFTs There has been a progression of patient's liver disease compared to previous baseline labs available.  I believe this is due to worsening of patient's to adeno cancer met to liver as reported by radiologist.  I have requested Dr. Grayland Ormond to  kindly evaluate patient in the a.m.  At this time we will check INR PTT for DVT prophylaxis  Leukocytosis This seems to be chronic but again progressive.  During my evaluation I did not encounter any focal signs of infection.  Obviously we still have the urinalysis pending.  At this time patient has received ceftriaxone and metronidazole in the ER, I did discuss with ER attending and his suspicion for a focal infection was also not high.  Regardless given patient's immunocompromise I will draw blood culture at this time.  And monitor patient.  I will not give him further antibiotics at this time  TSH elevation Check free T4 in the a.m., I anticipate sick euthyroid syndrome  Hypermagnesemia Due to AKI, trend maintain on telemetry  Thrombocytosis Heme-onc evaluation in the morning  AKI (acute kidney injury) ([place]) Check urine sodium and creatinine my impression is that this is due to hypercalcemia.  I do not think patient is a in a prerenal state right now, patient has been drinking ample fluids at home reports no diarrhea vomiting or fever.  Reports decent urine output.  Check urinalysis.  Leg swelling There are no DVT on ultrasound, I believe this is likely due to generalized fluid overload as it is associated with slight crackles in the lung, ascites as well as leg swelling.  See fluid overload below  Hyponatremia Moderate, check serum osmolality.  I will maintain the patient on fluid restriction patient reports drinking ample fluids at home this is corroborated by documentation of patient being advised to drink ample fluids at home.  Check urine sodium and creatinine hold hydrochlorothiazide  Hyperkalemia I will hold the patient's losartan, I will hold the patient's oral potassium supplementation, I will give the patient a renal diet.  There are several contributory factors to this including patient's AKI, see below      Advance Care Planning:  Code Status: Prior full code  Consults:  I have requested Dr. Grayland Ormond to kindly round on the patient in the morning via staff message.  Currently to confirm the same is received.  Family Communication: Wife at the bedside  Severity of Illness: The appropriate patient status for this patient is INPATIENT. Inpatient status is judged to be reasonable and necessary in order to provide the required intensity of service to ensure the patient's safety. The patient's presenting symptoms, physical exam findings, and initial radiographic and laboratory data in the context of their chronic comorbidities is felt to place them at high risk for further clinical deterioration. Furthermore, it is not anticipated that the patient will be medically stable for discharge from the hospital within 2 midnights of admission.   * I certify that at the point of admission it is my clinical judgment that the patient will require inpatient hospital care spanning beyond 2 midnights from the point of admission due to high intensity of service, high risk for further deterioration and high frequency of surveillance required.*  Author: Gertie Fey, MD 08/19/2022 7:54 PM  For on call review [email or web address].

## 2022-08-20 ENCOUNTER — Inpatient Hospital Stay: Payer: BC Managed Care – PPO

## 2022-08-20 ENCOUNTER — Inpatient Hospital Stay: Payer: BC Managed Care – PPO | Admitting: Oncology

## 2022-08-20 ENCOUNTER — Other Ambulatory Visit: Payer: Self-pay | Admitting: Oncology

## 2022-08-20 ENCOUNTER — Inpatient Hospital Stay (HOSPITAL_COMMUNITY)
Admission: EM | Admit: 2022-08-20 | Discharge: 2022-08-20 | Disposition: A | Discharge status: Home / Self Care | Payer: BC Managed Care – PPO | Source: Home / Self Care | Attending: Internal Medicine | Admitting: Internal Medicine

## 2022-08-20 DIAGNOSIS — N179 Acute kidney failure, unspecified: Secondary | ICD-10-CM | POA: Diagnosis not present

## 2022-08-20 DIAGNOSIS — I5021 Acute systolic (congestive) heart failure: Secondary | ICD-10-CM | POA: Diagnosis not present

## 2022-08-20 DIAGNOSIS — E871 Hypo-osmolality and hyponatremia: Secondary | ICD-10-CM | POA: Diagnosis not present

## 2022-08-20 DIAGNOSIS — C7889 Secondary malignant neoplasm of other digestive organs: Secondary | ICD-10-CM

## 2022-08-20 DIAGNOSIS — C179 Malignant neoplasm of small intestine, unspecified: Secondary | ICD-10-CM

## 2022-08-20 DIAGNOSIS — C799 Secondary malignant neoplasm of unspecified site: Secondary | ICD-10-CM

## 2022-08-20 DIAGNOSIS — R601 Generalized edema: Secondary | ICD-10-CM | POA: Diagnosis not present

## 2022-08-20 LAB — SODIUM, URINE, RANDOM: Sodium, Ur: <10 mmol/L

## 2022-08-20 LAB — URINALYSIS, COMPLETE (UACMP) WITH MICROSCOPIC
Bacteria, UA: NONE SEEN
Bilirubin Urine: NEGATIVE
Glucose, UA: NEGATIVE mg/dL
Hgb urine dipstick: NEGATIVE
Ketones, ur: 5 mg/dL — AB
Leukocytes,Ua: NEGATIVE
Nitrite: NEGATIVE
Protein, ur: NEGATIVE mg/dL
Specific Gravity, Urine: 1.026 (ref 1.005–1.030)
pH: 5 (ref 5.0–8.0)

## 2022-08-20 LAB — BASIC METABOLIC PANEL
Anion gap: 11 (ref 5–15)
BUN: 38 mg/dL — ABNORMAL HIGH (ref 8–23)
CO2: 21 mmol/L — ABNORMAL LOW (ref 22–32)
Calcium: 10.8 mg/dL — ABNORMAL HIGH (ref 8.9–10.3)
Chloride: 95 mmol/L — ABNORMAL LOW (ref 98–111)
Creatinine, Ser: 0.99 mg/dL (ref 0.61–1.24)
GFR, Estimated: >60 mL/min (ref >60)
Glucose, Bld: 99 mg/dL (ref 70–99)
Potassium: 5 mmol/L (ref 3.5–5.1)
Sodium: 127 mmol/L — ABNORMAL LOW (ref 135–145)

## 2022-08-20 LAB — PROTEIN / CREATININE RATIO, URINE
Creatinine, Urine: 89 mg/dL
Protein Creatinine Ratio: 0.18 mg/mg{Cre} — ABNORMAL HIGH (ref 0.00–0.15)
Total Protein, Urine: 16 mg/dL

## 2022-08-20 LAB — VITAMIN D 25 HYDROXY (VIT D DEFICIENCY, FRACTURES): Vit D, 25-Hydroxy: 71.21 ng/mL (ref 30–100)

## 2022-08-20 LAB — ECHOCARDIOGRAM COMPLETE
Height: 71 in
S' Lateral: 2.3 cm
Weight: 4119.96 oz

## 2022-08-20 LAB — CBC
HCT: 33.5 % — ABNORMAL LOW (ref 39.0–52.0)
Hemoglobin: 10.5 g/dL — ABNORMAL LOW (ref 13.0–17.0)
MCH: 23.8 pg — ABNORMAL LOW (ref 26.0–34.0)
MCHC: 31.3 g/dL (ref 30.0–36.0)
MCV: 76 fL — ABNORMAL LOW (ref 80.0–100.0)
Platelets: 761 10*3/uL — ABNORMAL HIGH (ref 150–400)
RBC: 4.41 MIL/uL (ref 4.22–5.81)
RDW: 24.2 % — ABNORMAL HIGH (ref 11.5–15.5)
WBC: 18 10*3/uL — ABNORMAL HIGH (ref 4.0–10.5)
nRBC: 0 % (ref 0.0–0.2)

## 2022-08-20 LAB — MAGNESIUM: Magnesium: 2.5 mg/dL — ABNORMAL HIGH (ref 1.7–2.4)

## 2022-08-20 LAB — AMMONIA: Ammonia: 52 umol/L — ABNORMAL HIGH (ref 9–35)

## 2022-08-20 LAB — T4, FREE: Free T4: 1.19 ng/dL — ABNORMAL HIGH (ref 0.61–1.12)

## 2022-08-20 LAB — PHOSPHORUS: Phosphorus: 2.4 mg/dL — ABNORMAL LOW (ref 2.5–4.6)

## 2022-08-20 MED ORDER — HEPARIN SODIUM (PORCINE) 5000 UNIT/ML IJ SOLN
5000.0000 [IU] | Freq: Three times a day (TID) | INTRAMUSCULAR | Status: DC
  Administered 2022-08-20 – 2022-08-25 (×14): 5000 [IU] via SUBCUTANEOUS
  Filled 2022-08-20 (×14): qty 1

## 2022-08-20 MED ORDER — GABAPENTIN 100 MG PO CAPS
100.0000 mg | ORAL_CAPSULE | Freq: Once | ORAL | Status: AC
  Administered 2022-08-20: 100 mg via ORAL
  Filled 2022-08-20: qty 1

## 2022-08-20 MED ORDER — GABAPENTIN 100 MG PO CAPS
100.0000 mg | ORAL_CAPSULE | Freq: Once | ORAL | Status: AC
  Administered 2022-08-21: 100 mg via ORAL
  Filled 2022-08-20: qty 1

## 2022-08-20 NOTE — Plan of Care (Signed)

## 2022-08-20 NOTE — Evaluation (Signed)
Occupational Therapy Evaluation Patient Details Name: Philip Arnold. MRN: [id number] DOB: 1961/01/06 Today's Date: 08/20/2022   History of Present Illness Pt is a 62 y.o. male with PMH significant for metastatic carcinoma of small bowel, HTN anemia, prostate cancer, obesity, HLD, and hyperglycemia who presented to the ED with generalized weakness, poor appetite as well as development of lower extremity swelling and abdominal distention. 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   Clinical Impression   Patient presenting with decreased independence in self care, balance, functional mobility/transfers, and endurance. PTA pt lived with spouse, was independent for ADLs, and independent for functional mobility without an AD. Pt currently functioning at Mod I for bed mobility, Min A for simulated toilet transfer, and Min guard for functional mobility at room level using RW. Pt completed seated grooming tasks with set up-supervision. Anticipate increased assistance required for standing ADL tasks. Meal tray being delivered at end of session and OT providing set up A for self-feeding. Pt will benefit from acute OT to increase overall independence in the areas of ADLs and functional mobility in order to safely discharge to next venue of care. Upon hospital discharge, recommend STR to maximize pt safety and return to PLOF.   Recommendations for follow up therapy are one component of a multi-disciplinary discharge planning process, led by the attending physician.  Recommendations may be updated based on patient status, additional functional criteria and insurance authorization.   Follow Up Recommendations  Skilled nursing-short term rehab (<3 hours/day)     Assistance Recommended at Discharge  Frequent or constant Supervision/Assistance  Patient can return home with the following A lot of help with bathing/dressing/bathroom;A lot of help with walking and/or transfers;Assistance with cooking/housework;Assist for transportation;Help with stairs or ramp for entrance    Functional Status Assessment  Patient has had a recent decline in their functional status and demonstrates the ability to make significant improvements in function in a reasonable and predictable amount of time.  Equipment Recommendations  Other (comment) (defer to next venue of care)    Recommendations for Other Services       Precautions / Restrictions Precautions Precautions: Fall Restrictions Weight Bearing Restrictions: No      Mobility Bed Mobility Overal bed mobility: Modified Independent                  Transfers Overall transfer level: Needs assistance Equipment used: Rolling walker (2 wheels) Transfers: Sit to/from Stand Sit to Stand: Min assist           General transfer comment: Min A for lower bed surface, VCs for safe technique      Balance Overall balance assessment: Needs assistance Sitting-balance support: Feet supported Sitting balance-[name]: Good     Standing balance support: Bilateral upper extremity supported, During functional activity, Reliant on assistive device for balance Standing balance-[name]: Fair                             ADL either performed or assessed with clinical judgement   ADL Overall ADL's : Needs assistance/impaired Eating/Feeding: Set up;Bed level   Grooming: Set up;Sitting;Wash/dry face;Supervision/safety                   Toilet Transfer: Minimal assistance;Rolling walker (2 wheels) Toilet Transfer Details (indicate cue type and reason): simulated  Functional mobility during ADLs: Min guard;Cueing for sequencing;Rolling walker (2 wheels) (to take 2 steps foward/backward then 4 lateral steps at  EOB toward [facility]) General ADL Comments: anticipate increased assistance for standing ADL tasks     Vision Baseline Vision/History: 1 Wears glasses Patient Visual Report: No change from baseline       Perception     Praxis      Pertinent Vitals/Pain Pain Assessment Pain Assessment: No/denies pain     Hand Dominance     Extremity/Trunk Assessment Upper Extremity Assessment Upper Extremity Assessment: Generalized weakness   Lower Extremity Assessment Lower Extremity Assessment: Generalized weakness       Communication Communication Communication: No difficulties   Cognition Arousal/Alertness: Awake/alert Behavior During Therapy: WFL for tasks assessed/performed Overall Cognitive Status: No family/caregiver present to determine baseline cognitive functioning                                 General Comments: Pt required extra time and VCs to follow multistep commands, delayed/slow processing     General Comments  Pt received on RA, SpO2 93% at rest. Vitals remained stable t/o activity.    Exercises Other Exercises Other Exercises: OT provided education re: role of OT, OT POC, post acute recs, sitting up for all meals, EOB/OOB mobility with assistance, home/fall safety.     Shoulder Instructions      Home Living Family/patient expects to be discharged to:: Private residence Living Arrangements: Spouse/significant other;Children Available Help at Discharge: Family;Available 24 hours/day Type of Home: House Home Access: Stairs to enter [employer] of Steps: 4 Entrance Stairs-Rails: Right;Left;Can reach both Home Layout: One level     Bathroom Shower/Tub: Occupational psychologist: Standard                Prior Functioning/Environment Prior Level of Function : Independent/Modified Independent;History of Falls (last six months);Driving             Mobility Comments: Ind amb community distances without an AD, one fall in  the last 6 months secondary to weakness ADLs Comments: Ind with ADLs        OT Problem List: Decreased strength;Decreased activity tolerance;Impaired balance (sitting and/or standing);Decreased cognition      OT Treatment/Interventions: Self-care/ADL training;Therapeutic exercise;Neuromuscular education;Energy conservation;DME and/or AE instruction;Manual therapy;Modalities;Balance training;Patient/family education;Visual/perceptual remediation/compensation;Cognitive remediation/compensation;Therapeutic activities;Splinting    OT Goals(Current goals can be found in the care plan section) Acute Rehab OT Goals Patient Stated Goal: get stronger, return to PLOF OT Goal Formulation: With patient Time For Goal Achievement: 09/03/22 Potential to Achieve Goals: Good   OT Frequency: Min 2X/week    Co-evaluation              AM-PAC OT "6 Clicks" Daily Activity     Outcome Measure Help from another person eating meals?: None Help from another person taking care of personal grooming?: A Little Help from another person toileting, which includes using toliet, bedpan, or urinal?: A Lot Help from another person bathing (including washing, rinsing, drying)?: A Lot Help from another person to put on and taking off regular upper body clothing?: A Little Help from another person to put on and taking off regular lower body clothing?: A Lot 6 Click Score: 16   End of Session Equipment Utilized During Treatment: Rolling walker (2 wheels);Gait belt Nurse Communication: Mobility status  Activity Tolerance: Patient tolerated treatment well;Patient limited by fatigue Patient left: in bed;with call  bell/phone within reach;with bed alarm set  OT Visit Diagnosis: Unsteadiness on feet (R26.81);History of falling (Z91.81);Muscle weakness (generalized) (M62.81)                Time: [id number] OT Time Calculation (min): 18 min Charges:  OT General Charges $OT Visit: 1 Visit OT Evaluation $OT Eval Low  Complexity: 1 Low  [facility] MS, OTR/L ascom [phone]  08/20/22, 6:36 PM

## 2022-08-20 NOTE — Consult Note (Signed)
ANTICOAGULATION CONSULT NOTE - Initial Consult  Pharmacy Consult for heparin Indication: VTE prophylaxis  No Known Allergies  Patient Measurements: Height: '5\' 11"'$  (180.3 cm) Weight: 116.8 kg (257 lb 8 oz) IBW/kg (Calculated) : 75.3   Vital Signs: Temp: 98 F (36.7 C) (03/04 1130) Temp Source: Oral (03/04 1130) BP: 124/80 (03/04 1130) Pulse Rate: 100 (03/04 1130)  Labs: Recent Labs    08/19/22 1656 08/19/22 1800 08/19/22 2256 08/20/22 0453  HGB 10.7*  --   --  10.5*  HCT 35.0*  --   --  33.5*  PLT 811*  --   --  761*  APTT  --   --  37*  --   LABPROT  --   --  16.8*  --   INR  --   --  1.4*  --   CREATININE 1.31*  --  1.10 0.99  TROPONINIHS 11 10  --   --     Estimated Creatinine Clearance: 100.6 mL/min (by C-G formula based on SCr of 0.99 mg/dL).   Medical History: Past Medical History:  Diagnosis Date   Cancer [facility])    Prostate Cancer   Elevated PSA    GERD (gastroesophageal reflux disease)    Hip pain    History of kidney stones    Hyperglycemia    Hypertension    Obesity     Medications:  Patient was on enoxaparin 57.5 mg SubQ every 24 hours for vte ppx.  Last dose 08/20/22 @ 0020  Assessment: 62 yo male presented to ED with generalized weakness and worsening swelling.  Patient has metastatic carcinoma of the small bowel.  Pharmacy consulted to start heparin for VTE prophylaxis.  Labs: hgb 10.5, plt 761, INR 1.4  Goal of Therapy:  Monitor platelets by anticoagulation protocol: Yes   Plan:  Discontinue enoxaparin Start heparin 5000 units SubQ every 8 hours Continue to monitor H&H and platelets  Lorin Picket, PharmD 08/20/2022,2:50 PM

## 2022-08-20 NOTE — Consult Note (Signed)
[place]  [phone] [phone]  ID: Philip Arnold. 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  Declining performance status.  INTERVAL HISTORY: Patient is a 62 year old male who was supposed to initiate palliative chemotherapy for the above-stated malignancy earlier today.  Over the past several days he became progressively more weak and fatigue with a poor appetite and decreased p.o. intake.  He also has increasing peripheral edema.  He feels improved since admission, but not back to baseline.  He has no neurologic complaints.  He denies any recent fevers or illnesses.  He has no chest pain, shortness of breath, cough, or hemoptysis.  He denies any nausea, vomiting, constipation, or diarrhea.  He has no melena or hematochezia.  He has no urinary complaints.  Patient feels generally terrible, but offers no further specific complaints today.  REVIEW OF SYSTEMS:   Review of Systems  Constitutional:  Positive for malaise/fatigue. Negative for fever and weight loss.  Respiratory: Negative.  Negative for cough, hemoptysis and shortness of breath.   Cardiovascular:  Positive for leg swelling. Negative for chest pain.  Gastrointestinal: Negative.  Negative for abdominal pain, constipation, diarrhea, nausea and vomiting.  Genitourinary: Negative.  Negative for frequency.  Musculoskeletal: Negative.  Negative for back pain.  Skin: Negative.  Negative for rash.  Neurological:  Positive for weakness. Negative for dizziness, focal weakness and headaches.  Psychiatric/Behavioral: Negative.  The patient is not nervous/anxious.     As per HPI. Otherwise, a complete review of systems is negative.  PAST MEDICAL HISTORY: Past Medical History:  Diagnosis Date   Cancer [facility])    Prostate Cancer    Elevated PSA    GERD (gastroesophageal reflux disease)    Hip pain    History of kidney stones    Hyperglycemia    Hypertension    Obesity     PAST SURGICAL HISTORY: Past Surgical History:  Procedure Laterality Date   COLONOSCOPY WITH PROPOFOL     IR IMAGING GUIDED PORT INSERTION  08/13/2022   PELVIC LYMPH NODE DISSECTION Bilateral 04/24/2019   Procedure: PELVIC LYMPH NODE DISSECTION;  Surgeon: Hollice Espy, MD;  Location: [facility] ORS;  Service: Urology;  Laterality: Bilateral;   ROBOT ASSISTED LAPAROSCOPIC RADICAL PROSTATECTOMY N/A 04/24/2019   Procedure: XI ROBOTIC ASSISTED LAPAROSCOPIC RADICAL PROSTATECTOMY;  Surgeon: Hollice Espy, MD;  Location: [facility] ORS;  Service: Urology;  Laterality: N/A;    FAMILY HISTORY: Family History  Problem Relation Age of Onset   Bone cancer Mother    Hypertension Father    Stroke Father     ADVANCED DIRECTIVES (Y/N):  '@ADVDIR'$ @  HEALTH MAINTENANCE: Social History   Tobacco Use   Smoking status: Never   Smokeless tobacco: Never  Vaping Use   Vaping Use: Never used  Substance Use Topics   Alcohol use: Not Currently   Drug use: Never     Colonoscopy:  PAP:  Bone density:  Lipid panel:  No Known Allergies  Current Facility-Administered Medications  Medication Dose Route Frequency Provider Last Rate Last Admin   albuterol (PROVENTIL) (2.5 MG/3ML) 0.083% nebulizer solution 2.5 mg  2.5 mg Inhalation Q4H PRN Gertie Fey, MD       aspirin EC tablet 81 mg  81 mg Oral Daily Gertie Fey, MD   81 mg at 08/20/22 0939   cholecalciferol (VITAMIN D3) 25 MCG (1000  UNIT) tablet 5,000 Units  5,000 Units Oral Daily Gertie Fey, MD   5,000 Units at 08/20/22 0939   heparin injection 5,000 Units  5,000 Units Subcutaneous Q8H Lorin Picket, [place]       metoprolol succinate (TOPROL-XL) 24 hr tablet 100 mg  100 mg Oral Daily Gertie Fey, MD   100 mg at 08/20/22 [id number]   oxyCODONE (Oxy IR/ROXICODONE) immediate release tablet 5 mg  5 mg Oral Q4H PRN Gertie Fey, MD       pantoprazole (PROTONIX) EC tablet 40 mg  40 mg Oral Daily Gertie Fey, MD   40 mg at 08/20/22 [id number]   prochlorperazine (COMPAZINE) tablet 10 mg  10 mg Oral Q6H PRN Gertie Fey, MD       simvastatin (ZOCOR) tablet 20 mg  20 mg Oral QHS Gertie Fey, MD   20 mg at 08/20/22 0021   sodium chloride flush (NS) 0.9 % injection 3 mL  3 mL Intravenous Q12H Gertie Fey, MD   3 mL at 08/20/22 0939    OBJECTIVE: Vitals:   08/20/22 1130 08/20/22 1528  BP: 124/80   Pulse: 100   Resp: 16   Temp: 98 F (36.7 C)   SpO2: 100% 94%     Body mass index is 35.91 kg/m.    ECOG FS:4 - Bedbound  General: Well-developed, well-nourished, no acute distress. Eyes: Pink conjunctiva, anicteric sclera. HEENT: Normocephalic, moist mucous membranes. Lungs: No audible wheezing or coughing. Heart: Regular rate and rhythm. Abdomen: Soft, nontender, no obvious distention. Musculoskeletal: 1-2+ bilateral lower extremity edema. Neuro: Alert, answering all questions appropriately. Cranial nerves grossly intact. Skin: No rashes or petechiae noted. Psych: Normal affect.  LAB RESULTS:  Lab Results  Component Value Date   NA 127 (L) 08/20/2022   K 5.0 08/20/2022   CL 95 (L) 08/20/2022   CO2 21 (L) 08/20/2022   GLUCOSE 99 08/20/2022   BUN 38 (H) 08/20/2022   CREATININE 0.99 08/20/2022   CALCIUM 10.8 (H) 08/20/2022   PROT 7.2 08/19/2022   ALBUMIN 2.1 (L) 08/19/2022   AST 138 (H) 08/19/2022   ALT 64 (H) 08/19/2022   ALKPHOS 372 (H) 08/19/2022   BILITOT 5.7 (H) 08/19/2022   GFRNONAA >60 08/20/2022   GFRAA >60 04/25/2019    Lab Results  Component Value Date   WBC 18.0 (H) 08/20/2022   NEUTROABS 13.9 (H) 07/31/2022   HGB 10.5 (L) 08/20/2022   HCT 33.5 (L) 08/20/2022   MCV 76.0 (L) 08/20/2022   PLT 761 (H) 08/20/2022     STUDIES: ECHOCARDIOGRAM COMPLETE  Result Date: 08/20/2022    ECHOCARDIOGRAM REPORT   Patient Name:   Philip Arnold. Date of Exam: 08/20/2022 Medical Rec #:  [id number]             Height:       71.0 in Accession #:    [id number]           Weight:       257.5 lb Date of Birth:  12-22-1960            BSA:          2.348 m Patient Age:    20 years             BP:           117/69 mmHg Patient Gender: M                    HR:  104 bpm. Exam Location:  [facility] Procedure: 2D Echo, Cardiac Doppler and Color Doppler Indications:     CHF-acute systolic [id number]  History:         Patient has no prior history of Echocardiogram examinations.                  Risk Factors:Hypertension.  Sonographer:     Sherrie Sport Referring Phys:  [id number] [facility] GOEL Diagnosing Phys: Kathlyn Sacramento MD  Sonographer Comments: Technically challenging study due to limited acoustic windows, no apical window and no subcostal window. Image acquisition challenging due to respiratory motion. IMPRESSIONS  1. Left ventricular ejection fraction, by estimation, is 55 to 60%. The left ventricle has normal function. Left ventricular endocardial border not optimally defined to evaluate regional wall motion. Left ventricular diastolic function could not be evaluated.  2. Right ventricular systolic function is normal. The right ventricular size is normal. Tricuspid regurgitation signal is inadequate for assessing PA pressure.  3. The mitral valve is normal in structure. No evidence of mitral valve regurgitation. No evidence of mitral stenosis.  4. The aortic valve is normal in structure. Aortic valve regurgitation is not visualized. No aortic stenosis is present.  5. Challenging image quality with only limited views. FINDINGS  Left Ventricle: Left ventricular ejection fraction, by estimation, is 55 to 60%. The left ventricle has normal function. Left ventricular endocardial border not optimally defined to evaluate regional wall motion. The left ventricular internal cavity size was normal in size. There is no left ventricular hypertrophy. Left ventricular diastolic function could not be evaluated. Right Ventricle: The right  ventricular size is normal. No increase in right ventricular wall thickness. Right ventricular systolic function is normal. Tricuspid regurgitation signal is inadequate for assessing PA pressure. Left Atrium: Left atrial size was normal in size. Right Atrium: Right atrial size was normal in size. Pericardium: There is no evidence of pericardial effusion. Mitral Valve: The mitral valve is normal in structure. No evidence of mitral valve regurgitation. No evidence of mitral valve stenosis. Tricuspid Valve: The tricuspid valve is normal in structure. Tricuspid valve regurgitation is not demonstrated. No evidence of tricuspid stenosis. Aortic Valve: The aortic valve is normal in structure. Aortic valve regurgitation is not visualized. No aortic stenosis is present. Pulmonic Valve: The pulmonic valve was normal in structure. Pulmonic valve regurgitation is not visualized. No evidence of pulmonic stenosis. Aorta: The aortic root is normal in size and structure. Venous: The inferior vena cava was not well visualized. IAS/Shunts: No atrial level shunt detected by color flow Doppler.  LEFT VENTRICLE PLAX 2D LVIDd:         3.20 cm LVIDs:         2.30 cm LV PW:         1.30 cm LV IVS:        0.80 cm LVOT diam:     2.10 cm LVOT Area:     3.46 cm  LEFT ATRIUM         Index LA diam:    4.00 cm 1.70 cm/m   AORTA Ao Root diam: 3.30 cm  SHUNTS Systemic Diam: 2.10 cm Kathlyn Sacramento MD Electronically signed by Kathlyn Sacramento MD Signature Date/Time: 08/20/2022/2:23:09 PM    Final    CT Angio Chest PE W and/or Wo Contrast  Result Date: 08/19/2022 CLINICAL DATA:  Abdominal pain and chest pain. Concern for pulmonary embolism and bowel obstruction. Metastatic adenocarcinoma of small-bowel. EXAM: CT ANGIOGRAPHY CHEST CT ABDOMEN AND PELVIS WITH CONTRAST TECHNIQUE:  Multidetector CT imaging of the chest was performed using the standard protocol during bolus administration of intravenous contrast. Multiplanar CT image reconstructions and  MIPs were obtained to evaluate the vascular anatomy. Multidetector CT imaging of the abdomen and pelvis was performed using the standard protocol during bolus administration of intravenous contrast. RADIATION DOSE REDUCTION: This exam was performed according to the departmental dose-optimization program which includes automated exposure control, adjustment of the mA and/or kV according to patient size and/or use of iterative reconstruction technique. CONTRAST:  144m OMNIPAQUE IOHEXOL 350 MG/ML SOLN COMPARISON:  Chest radiograph dated 08/19/2022 and CT dated 08/14/2022. CT abdomen pelvis dated 07/31/2022. FINDINGS: Evaluation of this exam is limited due to respiratory motion artifact. CTA CHEST FINDINGS Cardiovascular: There is no cardiomegaly or pericardial effusion. The thoracic aorta is unremarkable. Evaluation of the pulmonary arteries is limited due to respiratory motion and suboptimal visualization of the peripheral branches. No central pulmonary artery embolus identified. Mediastinum/Nodes: No hilar adenopathy. Mildly rounded subcarinal lymph node measures 8 mm short axis. The esophagus is grossly unremarkable. No mediastinal fluid collection. Right-sided Port-A-Cath with tip at the cavoatrial junction. Lungs/Pleura: Shallow inspiration. There are bibasilar linear and streaky atelectasis. Multiple bilateral pulmonary nodules measuring up to 8 mm in the upper pole right upper lobe in keeping with known metastatic disease. Trace right pleural effusion. No pneumothorax. The central airways are patent. Musculoskeletal: Degenerative changes of the spine. No acute osseous pathology. Review of the MIP images confirms the above findings. CT ABDOMEN and PELVIS FINDINGS No intra-abdominal free air. Small ascites, increased since the prior CT. Hepatobiliary: Multiple (greater than 20) hepatic hypodense metastatic disease similar or progressed since the prior CT. No calcified gallstone. Pancreas: The pancreas is  unremarkable as visualized. Spleen: Normal in size without focal abnormality. Adrenals/Urinary Tract: The adrenal glands unremarkable. There is no hydronephrosis on either side. There is a 7 cm cyst in the upper pole of the left kidney. There is symmetric enhancement and excretion of contrast by both kidneys. The visualized ureters and the urinary bladder is unremarkable. Stomach/Bowel: Ill-defined thickening of the small bowel loop in the mid abdomen as seen on the prior CT in keeping with known malignancy. There is no bowel obstruction. The appendix is normal. Vascular/Lymphatic: Mild aortoiliac atherosclerotic disease. The IVC is unremarkable. No portal venous gas. Periportal adenopathy measure approximately 19 mm in short axis. There is extensive omental nodularity, progressed since the prior CT. An omental mass in the left anterior abdomen measures 8.8 x 3.3 cm. Overall increase in the omental implants since the prior CT. Reproductive: Prostatectomy. Other: Mild subcutaneous edema. Musculoskeletal: Degenerative changes.  No acute osseous pathology. Review of the MIP images confirms the above findings. IMPRESSION: 1. No acute intrathoracic pathology. No central pulmonary artery embolus identified. 2. Multiple bilateral pulmonary nodules in keeping with known metastatic disease and relatively similar to prior CT of 08/14/2022. 3. Progression of metastatic disease in the abdomen and pelvis since the CT of 07/31/2022. There is increased ascites and progression of omental caking. 4. Ill-defined thickening of the small bowel loop in the mid abdomen in keeping with known malignancy. No bowel obstruction. Normal appendix. 5. Hepatic metastatic disease, similar or progressed since the prior CT. 6.  Aortic Atherosclerosis (ICD10-I70.0). Electronically Signed   By: AAnner CreteM.D.   On: 08/19/2022 19:05   CT ABDOMEN PELVIS W CONTRAST  Result Date: 08/19/2022 CLINICAL DATA:  Abdominal pain and chest pain. Concern  for pulmonary embolism and bowel obstruction. Metastatic adenocarcinoma of small-bowel. EXAM:  CT ANGIOGRAPHY CHEST CT ABDOMEN AND PELVIS WITH CONTRAST TECHNIQUE: Multidetector CT imaging of the chest was performed using the standard protocol during bolus administration of intravenous contrast. Multiplanar CT image reconstructions and MIPs were obtained to evaluate the vascular anatomy. Multidetector CT imaging of the abdomen and pelvis was performed using the standard protocol during bolus administration of intravenous contrast. RADIATION DOSE REDUCTION: This exam was performed according to the departmental dose-optimization program which includes automated exposure control, adjustment of the mA and/or kV according to patient size and/or use of iterative reconstruction technique. CONTRAST:  162m OMNIPAQUE IOHEXOL 350 MG/ML SOLN COMPARISON:  Chest radiograph dated 08/19/2022 and CT dated 08/14/2022. CT abdomen pelvis dated 07/31/2022. FINDINGS: Evaluation of this exam is limited due to respiratory motion artifact. CTA CHEST FINDINGS Cardiovascular: There is no cardiomegaly or pericardial effusion. The thoracic aorta is unremarkable. Evaluation of the pulmonary arteries is limited due to respiratory motion and suboptimal visualization of the peripheral branches. No central pulmonary artery embolus identified. Mediastinum/Nodes: No hilar adenopathy. Mildly rounded subcarinal lymph node measures 8 mm short axis. The esophagus is grossly unremarkable. No mediastinal fluid collection. Right-sided Port-A-Cath with tip at the cavoatrial junction. Lungs/Pleura: Shallow inspiration. There are bibasilar linear and streaky atelectasis. Multiple bilateral pulmonary nodules measuring up to 8 mm in the upper pole right upper lobe in keeping with known metastatic disease. Trace right pleural effusion. No pneumothorax. The central airways are patent. Musculoskeletal: Degenerative changes of the spine. No acute osseous pathology.  Review of the MIP images confirms the above findings. CT ABDOMEN and PELVIS FINDINGS No intra-abdominal free air. Small ascites, increased since the prior CT. Hepatobiliary: Multiple (greater than 20) hepatic hypodense metastatic disease similar or progressed since the prior CT. No calcified gallstone. Pancreas: The pancreas is unremarkable as visualized. Spleen: Normal in size without focal abnormality. Adrenals/Urinary Tract: The adrenal glands unremarkable. There is no hydronephrosis on either side. There is a 7 cm cyst in the upper pole of the left kidney. There is symmetric enhancement and excretion of contrast by both kidneys. The visualized ureters and the urinary bladder is unremarkable. Stomach/Bowel: Ill-defined thickening of the small bowel loop in the mid abdomen as seen on the prior CT in keeping with known malignancy. There is no bowel obstruction. The appendix is normal. Vascular/Lymphatic: Mild aortoiliac atherosclerotic disease. The IVC is unremarkable. No portal venous gas. Periportal adenopathy measure approximately 19 mm in short axis. There is extensive omental nodularity, progressed since the prior CT. An omental mass in the left anterior abdomen measures 8.8 x 3.3 cm. Overall increase in the omental implants since the prior CT. Reproductive: Prostatectomy. Other: Mild subcutaneous edema. Musculoskeletal: Degenerative changes.  No acute osseous pathology. Review of the MIP images confirms the above findings. IMPRESSION: 1. No acute intrathoracic pathology. No central pulmonary artery embolus identified. 2. Multiple bilateral pulmonary nodules in keeping with known metastatic disease and relatively similar to prior CT of 08/14/2022. 3. Progression of metastatic disease in the abdomen and pelvis since the CT of 07/31/2022. There is increased ascites and progression of omental caking. 4. Ill-defined thickening of the small bowel loop in the mid abdomen in keeping with known malignancy. No bowel  obstruction. Normal appendix. 5. Hepatic metastatic disease, similar or progressed since the prior CT. 6.  Aortic Atherosclerosis (ICD10-I70.0). Electronically Signed   By: AAnner CreteM.D.   On: 08/19/2022 19:05   UKoreaVenous Img Lower Bilateral  Result Date: 08/19/2022 CLINICAL DATA:  BLE edema EXAM: BILATERAL LOWER EXTREMITY VENOUS DOPPLER  ULTRASOUND TECHNIQUE: Gray-scale sonography with compression, as well as color and duplex ultrasound, were performed to evaluate the deep venous system(s) from the level of the common femoral vein through the popliteal and proximal calf veins. COMPARISON:  None Available. FINDINGS: VENOUS Normal compressibility of the common femoral, superficial femoral, and popliteal veins, as well as the visualized calf veins. Visualized portions of profunda femoral vein and great saphenous vein unremarkable. No filling defects to suggest DVT on grayscale or color Doppler imaging. Doppler waveforms show normal direction of venous flow, normal respiratory plasticity and response to augmentation. OTHER None. Limitations: none IMPRESSION: Negative. Electronically Signed   By: Valentino Saxon M.D.   On: 08/19/2022 17:49   DG Chest 2 View  Result Date: 08/19/2022 CLINICAL DATA:  History of liver cancer.  Bilateral leg swelling EXAM: CHEST - 2 VIEW COMPARISON:  CT of the chest August 14, 2022 FINDINGS: Bibasilar atelectasis remains. A right Port-A-Cath is in good position. No pneumothorax. The cardiomediastinal silhouette is stable. No other suspicious findings. IMPRESSION: Bibasilar atelectasis. No other acute abnormalities. Electronically Signed   By: Dorise Bullion III M.D.   On: 08/19/2022 16:23   CT Chest W Contrast  Result Date: 08/15/2022 CLINICAL DATA:  Staging exam. 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 * Tracking Code: BO * EXAM: CT CHEST WITH CONTRAST TECHNIQUE: Multidetector CT imaging of the chest was performed during intravenous contrast administration.  RADIATION DOSE REDUCTION: This exam was performed according to the departmental dose-optimization program which includes automated exposure control, adjustment of the mA and/or kV according to patient size and/or use of iterative reconstruction technique. CONTRAST:  42m OMNIPAQUE IOHEXOL 300 MG/ML  SOLN COMPARISON:  None Available. FINDINGS: Cardiovascular: The heart size is normal. No substantial pericardial effusion. Coronary artery calcification is evident. No thoracic aortic aneurysm. Right Port-A-Cath tip is positioned at the SVC/RA junction. Enlargement of the pulmonary outflow tract/main pulmonary arteries suggests pulmonary arterial hypertension. Mediastinum/Nodes: Mild lymphadenopathy noted around the inferior descending thoracic aorta. There is no hilar lymphadenopathy. The esophagus has normal imaging features. There is no axillary lymphadenopathy. Left-sided retrocrural lymphadenopathy evident. Lungs/Pleura: Numerous bilateral pulmonary nodules identified involving all lobes of both lungs. Index right upper lobe pulmonary nodule on image 52/4 measures 8 mm. Index nodule in the left upper lobe measures 8 mm on image 34/4. Left lower lobe index nodule measures 8 mm on image 91/4. There is bilateral lower lobe atelectasis, right greater than left. No dense focal airspace consolidation. No pleural effusion. Upper Abdomen: Multiple hepatic metastases again noted as characterized on recent abdomen/pelvis CT. Index lesion today in the anterior hepatic dome measuring 2.9 cm on image 101/2 was 2.4 cm previously (remeasured). A second lesion in the medial right liver measuring 3.1 cm on image 115/2 today was 2.7 cm previously (remeasured). Perihepatic ascites is progressive in the interval with new Peri splenic ascites evident on today's exam. Musculoskeletal: No worrisome lytic or sclerotic osseous abnormality. IMPRESSION: 1. Numerous bilateral pulmonary nodules involving all lobes of both lungs measure up to 8  mm in size, consistent with metastatic disease. 2. Mild lymphadenopathy around the inferior descending thoracic aorta and in the left retrocrural space, consistent with metastatic disease. 3. Multiple hepatic metastases as characterized on recent abdomen/pelvis CT. Lesions visible in the upper liver today appear minimally progressive in the 2 week interval since the prior abdomen/pelvis CT 4. Interval progression of perihepatic ascites with new perisplenic ascites evident on today's exam. 5. Enlargement of the pulmonary outflow tract/main pulmonary arteries suggests  pulmonary arterial hypertension. Electronically Signed   By: Misty Stanley M.D.   On: 08/15/2022 07:05   IR IMAGING GUIDED PORT INSERTION  Result Date: 08/13/2022 INDICATION: port placement for chemotherapy EXAM: Chest port placement using ultrasound and fluoroscopic guidance MEDICATIONS: Documented in the EMR ANESTHESIA/SEDATION: Moderate (conscious) sedation was employed during this procedure. A total of Versed 2 mg and Fentanyl 100 mcg was administered intravenously. Moderate Sedation Time: 30 minutes. The patient's level of consciousness and vital signs were monitored continuously by radiology nursing throughout the procedure under my direct supervision. FLUOROSCOPY TIME:  Fluoroscopy Time: 0.9 minutes (8 mGy) COMPLICATIONS: None immediate. PROCEDURE: Informed written consent was obtained from the patient after a thorough discussion of the procedural risks, benefits and alternatives. All questions were addressed. Maximal Sterile Barrier Technique was utilized including caps, mask, sterile gowns, sterile gloves, sterile drape, hand hygiene and skin antiseptic. A timeout was performed prior to the initiation of the procedure. The patient was placed supine on the exam table. The right neck and chest was prepped and draped in the standard sterile fashion. A preliminary ultrasound of the right neck was performed and demonstrates a patent right  internal jugular vein. A permanent ultrasound image was stored in the electronic medical record. The overlying skin was anesthetized with 1% Lidocaine. Using ultrasound guidance, access was obtained into the right internal jugular vein using a 21 gauge micropuncture set. A wire was advanced into the SVC, a short incision was made at the puncture site, and serial dilatation performed. Next, in an ipsilateral infraclavicular location, an incision was made at the site of the subcutaneous reservoir. Blunt dissection was used to open a pocket to contain the reservoir. A subcutaneous tunnel was then created from the port site to the puncture site. A(n) 8 Fr single lumen catheter was advanced through the tunnel. The catheter was attached to the port and this was placed in the subcutaneous pocket. Under fluoroscopic guidance, a peel away sheath was placed, and the catheter was trimmed to the appropriate length and was advanced into the central veins. The catheter length is 23 cm. The tip of the catheter lies near the superior cavoatrial junction. The port flushes and aspirates appropriately. The port was flushed and locked with heparinized saline. The port pocket was closed in 2 layers using 3-0 and 4-0 Vicryl/absorbable suture. Dermabond was also applied to both incisions. The patient tolerated the procedure well and was transferred to recovery in stable condition. IMPRESSION: Successful placement of a right-sided chest port via the right internal jugular vein. The port is ready for immediate use. Electronically Signed   By: Albin Felling M.D.   On: 08/13/2022 16:14   US BIOPSY (LIVER)  Result Date: 08/06/2022 INDICATION: liver mass EXAM: ULTRASOUND GUIDED LIVER MASS BIOPSY COMPARISON:  CT AP, 07/31/2022. MEDICATIONS: None ANESTHESIA/SEDATION: Moderate (conscious) sedation was employed during this procedure. A total of Versed 1 mg and Fentanyl 50 mcg was administered intravenously. Moderate Sedation Time: 10 minutes.  The patient's level of consciousness and vital signs were monitored continuously by radiology nursing throughout the procedure under my direct supervision. COMPLICATIONS: None immediate. PROCEDURE: Informed written consent was obtained from the patient and/or patient's representative after a discussion of the risks, benefits and alternatives to treatment. The patient understands and consents the procedure. A timeout was performed prior to the initiation of the procedure. Ultrasound scanning was performed of the right upper abdominal quadrant demonstrates multifocal liver masses A LEFT hepatic lobe mass was selected for biopsy and the procedure  was planned. The right upper abdominal quadrant was prepped and draped in the usual sterile fashion. The overlying soft tissues were anesthetized with 1% lidocaine with epinephrine. A 17 gauge, 6.8 cm co-axial needle was advanced into a peripheral aspect of the lesion. This was followed by 3 core biopsies with an 18 gauge core device under direct ultrasound guidance. The coaxial needle tract was embolized with a small amount of Gel-Foam slurry and superficial hemostasis was obtained with manual compression. Post procedural scanning was negative for definitive area of hemorrhage or additional complication. A dressing was placed. The patient tolerated the procedure well without immediate post procedural complication. IMPRESSION: Successful ultrasound guided core needle biopsy of liver mass. Michaelle Birks, MD [facility] Electronically Signed   By: Michaelle Birks M.D.   On: 08/06/2022 14:53   CT ABDOMEN PELVIS W CONTRAST  Result Date: 07/31/2022 CLINICAL DATA:  30 pound weight loss. Increased calcium level. Increased liver enzymes. Elevated white blood cell count. Prostatectomy 4 years ago. * Tracking Code: BO * EXAM: CT ABDOMEN AND PELVIS WITH CONTRAST TECHNIQUE: Multidetector CT imaging of the abdomen and pelvis was  performed using the standard protocol following bolus administration of intravenous contrast. RADIATION DOSE REDUCTION: This exam was performed according to the departmental dose-optimization program which includes automated exposure control, adjustment of the mA and/or kV according to patient size and/or use of iterative reconstruction technique. CONTRAST:  17m OMNIPAQUE IOHEXOL 300 MG/ML  SOLN COMPARISON:  05/29/2022 chest radiograph.  Prostate MRI 03/06/2019. FINDINGS: Lower chest: Bibasilar pulmonary nodules. Example right middle lobe 5 mm nodule on 08/04. Normal heart size without pericardial or pleural effusion. Areas of left-sided pleural-based nodularity versus subpleural lymph nodes. Example 8 mm on 06/02. Hepatobiliary: Innumerable, bilateral liver masses consistent with metastasis. Example posterior right hepatic lobe (segment 7) 3.7 x 3.0 cm on 21/2. Central anterior segment 2 mass measures 3.4 x 3.2 cm on 20/2. No calcified gallstone or biliary duct dilatation. Pancreas: Normal, without mass or ductal dilatation. Spleen: Normal in size, without focal abnormality. Adrenals/Urinary Tract: Normal right adrenal gland. Mild left adrenal thickening and nodularity, including at up to 1.0 cm on 25/2. Interpolar left renal 5.7 cm fluid density lesion is likely a cyst . In the absence of clinically indicated signs/symptoms require(s) no independent follow-up. Normal right kidney. No hydronephrosis. Normal urinary bladder. Stomach/Bowel: Proximal gastric underdistention. Scattered colonic diverticula. Normal terminal ileum and appendix. A loop of markedly thickened jejunum is identified on 57/2 and coronal image 57. Vascular/Lymphatic: Aortic atherosclerosis. Porta hepatis adenopathy including a portacaval 1.6 cm node on 36/2. Small bowel mesenteric adenopathy, centered about the thickened small bowel loop. Example nodes at up to 2.6 x 2.4 cm on 55/2. No pelvic sidewall adenopathy. Reproductive: Prostatectomy,  without local recurrence. Other: Small volume abdominopelvic ascites. Extensive peritoneal metastasis. Example left omental implant of 4.2 x 4.5 cm on 47/2. No free intraperitoneal air. Tiny fat containing right inguinal hernia. Small fat containing paraumbilical hernia. Mild right-sided gynecomastia. Musculoskeletal: Presumably degenerative partial fusion of the bilateral sacroiliac joints. Lumbosacral spondylosis. IMPRESSION: 1. Widespread metastatic disease, including to liver, abdominal nodes, peritoneum, and likely the left pleural space. 2. 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 3. Small bibasilar pulmonary nodules, nonspecific but mildly suspicious for metastatic disease. 4. Small volume abdominopelvic ascites. 5. Prostatectomy, without local recurrence. Distribution of metastatic disease felt unlikely to represent prostate. 6. Indeterminate left adrenal nodule. 7.  Aortic  Atherosclerosis (ICD10-I70.0). Electronically Signed   By: Abigail Miyamoto M.D.   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  Declining performance status.  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  Given patient's admission to the hospital and declining performance status, will delay treatment at least 1 week. Weakness and fatigue: Multifactorial including poor appetite and progressive malignancy.  Patient expressing desire to get up.  Agree with physical therapy consult for evaluation. Peripheral edema/ascites: Likely secondary to fluid overload and malignancy.  Improving.  Patient current only under fluid restriction and diuresis as needed. Hyponatremia: Chronic and unchanged, but improved since admission.  Current sodium is 127. Hypercalcemia: Secondary to malignancy.  Patient received 4 mg IV  Zometa on August 19, 2022. Leukocytosis: Likely reactive, monitor. Anemia: Chronic and unchanged.  Monitor. Thrombocytosis: Likely reactive, monitor.  Appreciate consult, will follow.  Lloyd Huger, MD   08/20/2022 3:43 PM

## 2022-08-20 NOTE — Evaluation (Signed)
Physical Therapy Evaluation Patient Details Name: Philip Arnold. MRN: [id number] DOB: Jul 28, 1960 Today's Date: 08/20/2022  History of Present Illness  Pt is a 62 y.o. male with PMH significant for metastatic carcinoma of small bowel, HTN anemia, prostate cancer, obesity, HLD, and hyperglycemia who presented to the ED with generalized weakness, poor appetite as well as development of lower extremity swelling and abdominal distention. 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   Clinical Impression  Pt was pleasant and motivated to participate during the session and put forth good effort throughout. Pt found on 3LO2/min with SpO2 98-99%, per nursing ok to trial pt on room air during session.  Pt required extra time and effort with bed mobility tasks and sit to stand transfers from an elevated EOB but required no physical assistance.  Pt was able to amb a max of around 8 feet with slow cadence and heavy lean on the RW for support and with mod cuing to ensure general safety with the RW.  Pt's SpO2 monitored frequently during the session on RA and never fell below 94%, nursing notified and instructed to leave pt on RA.  Pt will benefit from PT services in a SNF setting upon discharge to safely address deficits listed in patient problem list for decreased caregiver assistance and eventual return to PLOF.         Recommendations for follow up therapy are one component of a multi-disciplinary discharge planning process, led by the attending physician.  Recommendations may be updated based on patient status, additional functional criteria and insurance authorization.  Follow Up Recommendations Skilled nursing-short term rehab (<3 hours/day) Can patient physically be transported by private  vehicle: Yes    Assistance Recommended at Discharge Frequent or constant Supervision/Assistance  Patient can return home with the following  A lot of help with walking and/or transfers;A little help with bathing/dressing/bathroom;Assistance with cooking/housework;Direct supervision/assist for medications management;Assist for transportation;Help with stairs or ramp for entrance    Equipment Recommendations Rolling walker (2 wheels);BSC/3in1;Other (comment) (TBD at next venue of care upon discharge to SNF)  Recommendations for Other Services       Functional Status Assessment Patient has had a recent decline in their functional status and demonstrates the ability to make significant improvements in function in a reasonable and predictable amount of time.     Precautions / Restrictions Precautions Precautions: Fall Restrictions Weight Bearing Restrictions: No      Mobility  Bed Mobility Overal bed mobility: Modified Independent             General bed mobility comments: Extra time and effort during sup to sit but no physical assistance needed    Transfers Overall transfer level: Needs assistance Equipment used: Rolling walker (2 wheels) Transfers: Sit to/from Stand Sit to Stand: Min guard, From elevated surface           General transfer comment: Min verbal cues for hand placement and increased trunk flexion    Ambulation/Gait Ambulation/Gait assistance: Min guard Gait Distance (Feet): 8 Feet Assistive device: Rolling walker (2 wheels) Gait Pattern/deviations: Step-through pattern, Decreased step length - right, Decreased step length - left, Trunk flexed Gait velocity: decreased     General Gait Details: Mod verbal cues for sequencing with the RW and for amb closer to the RW with upright posture  Stairs  Wheelchair Mobility    Modified Rankin (Stroke Patients Only)       Balance Overall balance assessment: Needs  assistance Sitting-balance support: Feet supported Sitting balance-[name]: Good     Standing balance support: Bilateral upper extremity supported, During functional activity, Reliant on assistive device for balance Standing balance-[name]: Fair                               Pertinent Vitals/Pain Pain Assessment Pain Assessment: No/denies pain    Home Living Family/patient expects to be discharged to:: Private residence Living Arrangements: Spouse/significant other;Children Available Help at Discharge: Family;Available 24 hours/day Type of Home: House Home Access: Stairs to enter Entrance Stairs-Rails: Right;Left;Can reach both Entrance Stairs-Number of Steps: 4   Home Layout: One level        Prior Function Prior Level of Function : Independent/Modified Independent;History of Falls (last six months)             Mobility Comments: Ind amb community distances without an AD, one fall in the last 6 months secondary to weakness ADLs Comments: Ind with ADLs     Hand Dominance        Extremity/Trunk Assessment   Upper Extremity Assessment Upper Extremity Assessment: Generalized weakness    Lower Extremity Assessment Lower Extremity Assessment: Generalized weakness       Communication   Communication: No difficulties  Cognition Arousal/Alertness: Awake/alert Behavior During Therapy: WFL for tasks assessed/performed Overall Cognitive Status: Impaired/Different from baseline Area of Impairment: Following commands                       Following Commands: Follows multi-step commands inconsistently       General Comments: Pt required extra time and cuing to follow some commands during the session        General Comments      Exercises Total Joint Exercises Ankle Circles/Pumps: AROM, Strengthening, Both, 5 reps, 10 reps Quad Sets: Strengthening, Both, 10 reps Gluteal Sets: Strengthening, Both, 10 reps Hip  ABduction/ADduction: AAROM, Strengthening, Both, 5 reps Straight Leg Raises: AAROM, Strengthening, Both, 5 reps Long Arc Quad: Strengthening, Both, 10 reps Knee Flexion: Strengthening, Both, 10 reps Marching in Standing: Strengthening, Both, 5 reps, Standing Other Exercises Other Exercises: HEP education for BLE APs, QS, GS and LAQs x 10 each every 1-2 hours daily   Assessment/Plan    PT Assessment Patient needs continued PT services  PT Problem List Decreased strength;Decreased activity tolerance;Decreased balance;Decreased mobility;Decreased cognition;Decreased knowledge of use of DME       PT Treatment Interventions DME instruction;Gait training;Stair training;Functional mobility training;Therapeutic activities;Therapeutic exercise;Balance training;Patient/family education    PT Goals (Current goals can be found in the Care Plan section)  Acute Rehab PT Goals Patient Stated Goal: To get stronger PT Goal Formulation: With patient Time For Goal Achievement: 09/02/22 Potential to Achieve Goals: Good    Frequency Min 2X/week     Co-evaluation               AM-PAC PT "6 Clicks" Mobility  Outcome Measure Help needed turning from your back to your side while in a flat bed without using bedrails?: A Little Help needed moving from lying on your back to sitting on the side of a flat bed without using bedrails?: A Little Help needed moving to and from a bed to a chair (including a wheelchair)?: A Little Help needed standing up from a chair  using your arms (e.g., wheelchair or bedside chair)?: A Little Help needed to walk in hospital room?: A Lot Help needed climbing 3-5 steps with a railing? : Total 6 Click Score: 15    End of Session Equipment Utilized During Treatment: Gait belt Activity Tolerance: Patient tolerated treatment well Patient left: in chair;with call bell/phone within reach;with chair alarm set;with family/visitor present;with nursing/sitter in room Nurse  Communication: Mobility status;Other (comment) (SpO2 on room air per above) PT Visit Diagnosis: Unsteadiness on feet (R26.81);Difficulty in walking, not elsewhere classified (R26.2);Muscle weakness (generalized) (M62.81)    Time: [id number] PT Time Calculation (min) (ACUTE ONLY): 38 min   Charges:   PT Evaluation $PT Eval Moderate Complexity: 1 Mod PT Treatments $Therapeutic Exercise: 8-22 mins      D. Royetta Asal PT, DPT 08/20/22, 3:48 PM

## 2022-08-20 NOTE — Progress Notes (Signed)
   08/20/22 1300  Spiritual Encounters  Type of Visit Initial  Care provided to: Pt and family  Referral source Nurse (RN/NT/LPN)  Reason for visit Advance directives  OnCall Visit No  Spiritual Framework  Presenting Themes Meaning/purpose/sources of inspiration  Community/Connection Family;Friend(s);Faith community  Patient Stress Factors None identified  Family Stress Factors None identified  Interventions  Spiritual Care Interventions Made Established relationship of care and support;Reflective listening;Compassionate presence  Spiritual Care Plan  Spiritual Care Issues Still Outstanding Chaplain will continue to follow   Spoke with patient and family member left them with a copy of advance directive.

## 2022-08-20 NOTE — Progress Notes (Signed)
*  PRELIMINARY RESULTS* Echocardiogram 2D Echocardiogram has been performed.  Philip Arnold 08/20/2022, 10:35 AM

## 2022-08-20 NOTE — Progress Notes (Addendum)
PROGRESS NOTE  Philip Arnold.    DOB: Jan 19, 1961, 62 y.o.  [id number]    Code Status: Full Code   DOA: 08/19/2022   LOS: 1   Brief hospital course  Philip Raveling. is a 62 y.o. male with a PMH significant for metastatic carcinoma of small bowel, HTN anemia, prostate cancer, obesity, HLD, .  They presented from home to the ED on 08/19/2022 with generalized weakness and worsened swelling x 4 days. Also endorses poor appetite.  Had liver biopsy 08/06/22 which showed metastatic adenocarcinoma. He was due to start therapy outpatient at cancer center 3/4. Dr. Grayland Ormond aware of admission.   In the ED, it was found that they had stable vital signs ORA. He did later develop some mild hypoxia and required 3L [place].  Significant findings included WBC 19.9, hgb 10.7, platelets 811, PT/INR 16.8/1.4troponin neg, Na+ 124, K+ 5.6, Cl- 91, glucose 122, Cr 1.31, albumin 2.1, AST/ALT 138/64, alk phos 372, total bili 5.7, BNP 41.5, lipase 64, Mg+ 2.8. Blood cultures were collected. Chest xray: bibasilar atelectasis LE doppler: negative DVT CTA chest: showed known metastatic nodules without PE CT abdomen pelvis significant findings: Progression of metastatic disease in the abdomen and pelvis since the CT of 07/31/2022. There is increased ascites and progression of omental caking.  Ill-defined thickening of the small bowel loop in the mid abdomen in keeping with known malignancy. No bowel obstruction.  Hepatic metastatic disease, similar or progressed since the prior CT.  They were initially treated with CTX, flagyl. Heme/onc was made aware of admission to consult.    Patient was admitted to medicine service for further workup and management of generalized weakness as outlined in detail below.  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 Suspicion for possible hepatic encephalopathy.  - obtain ammonia, consider lactulose - registered dietician consult - electrolyte management  - avoid sedating medications - PT/OT  Anasarca- given abdominal size and increased interval pleural fluid on imaging, I was expecting to order paracentesis but patient has no tenderness to palpation on exam so may hold off. 1+ pitting edema bilateral LE. From liver disease/leaky gut. Echo ordered on admission and will follow up to evaluate for hepatocardio syndrome evaluation - fluid restrict - diuresis PRN - f/u echo - strict I/O  Acute hypoxic respiratory failure- currently on 3.5L. due to volume overload although no overt congestion on xray. May be also contributed by hiccups.  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 Was scheduled to start outpatient treatment 3/4 but was inpatient for acute weakness instead. - heme/onc following, appreciate care - follow liver labs - supportive care PRN - heparin DVT ppx for hypercoagulable state  Hypercalcemia- 10.8 and stable - f/u PTH, ionized calcium   Leukocytosis- acute reaction. no overt signs of infection.  - monitor blood cultures - not continuing Abx at this time   TSH elevation- free T4 elevated 1.19   Hypermagnesemia- improving. Mg++ 2.8>2.5 Due  to AKI, trend maintain on telemetry   Thrombocytosis Heme-onc evaluation  AKI- resolved  Hyponatremia- chronic.  - hold HCTZ - monitor - fluid restrict   Hyperkalemia- resolved.  - monitor  Body mass index is 35.91 kg/m.  VTE ppx:  SCDs Start: 08/19/22 2224  Diet:     Diet   Diet renal with fluid restriction Fluid restriction: 1200 mL Fluid; Room service appropriate? Yes; Fluid consistency: Thin   Consultants: Heme/onc  Subjective 08/20/22    Pt reports having to use the bathroom. States his external catheter is leaking. Denies abdominal pain or nausea. He endorses hiccups intermittently and denies CP, SOB.  Wife is on phone on speak throughout encounter.   Objective   Vitals:   08/19/22 2144 08/20/22 0055 08/20/22 0448 08/20/22 1130  BP:  117/71 117/69 124/80  Pulse:  89 (!) 104 100  Resp:  '18 18 16  '$ Temp:  97.6 F (36.4 C) 98.6 F (37 C) 98 F (36.7 C)  TempSrc:    Oral  SpO2:  100% 100% 100%  Weight: 116.8 kg     Height: '5\' 11"'$  (1.803 m)       Intake/Output Summary (Last 24 hours) at 08/20/2022 1429 Last data filed at 08/20/2022 1034 Gross per 24 hour  Intake 230 ml  Output 600 ml  Net -370 ml   Filed Weights   08/19/22 1552 08/19/22 2144  Weight: 112.5 kg 116.8 kg     Physical Exam:  General: awake, alert, NAD, tired-appearing HEENT: atraumatic, clear conjunctiva, anicteric sclera, MMM, hearing grossly normal Respiratory: normal respiratory effort. hiccups Cardiovascular: quick capillary refill, normal S1/S2, RRR, mild JVD, murmurs Gastrointestinal: obese, non-tender to palpation. Several healed surgical scars. Positive mild fluid wave Nervous: A&O x3. no gross focal neurologic deficits, normal speech Extremities: moves all equally, 1+ pitting pedal edema, normal tone Skin: dry, intact, normal temperature, normal color. No rashes, lesions or ulcers on exposed skin Psychiatry: flat mood, congruent affect  Labs   I have personally reviewed the following labs and imaging studies CBC    Component Value Date/Time   WBC 18.0 (H) 08/20/2022 0453   RBC 4.41 08/20/2022 0453   HGB 10.5 (L) 08/20/2022 0453   HCT 33.5 (L) 08/20/2022 0453   PLT 761 (H) 08/20/2022 0453   MCV 76.0 (L)  08/20/2022 0453   MCH 23.8 (L) 08/20/2022 0453   MCHC 31.3 08/20/2022 0453   RDW 24.2 (H) 08/20/2022 0453   LYMPHSABS 1.5 07/31/2022 0900   MONOABS 1.6 (H) 07/31/2022 0900   EOSABS 0.1 07/31/2022 0900   BASOSABS 0.1 07/31/2022 0900      Latest Ref Rng & Units 08/20/2022    4:53 AM 08/19/2022   10:56 PM 08/19/2022    4:56 PM  BMP  Glucose 70 - 99 mg/dL 99  101  122   BUN 8 - 23 mg/dL 38  39  38   Creatinine 0.61 - 1.24 mg/dL 0.99  1.10  1.31   Sodium 135 - 145 mmol/L 127  125  124   Potassium 3.5 - 5.1 mmol/L 5.0  5.4  5.6   Chloride 98 - 111 mmol/L 95  94  91   CO2 22 - 32 mmol/L '21  22  21   '$ Calcium 8.9 - 10.3 mg/dL 10.8  10.8  11.0     ECHOCARDIOGRAM COMPLETE  Result Date: 08/20/2022    ECHOCARDIOGRAM REPORT   Patient Name:   Philip Mikrut. Date of Exam: 08/20/2022 Medical Rec #:  [id number]            Height:       71.0 in Accession #:    [id number]           Weight:       257.5 lb Date of Birth:  1960/12/26            BSA:          2.348 m Patient Age:    54 years             BP:           117/69 mmHg Patient Gender: M                    HR:           104 bpm. Exam Location:  [facility] Procedure: 2D Echo, Cardiac Doppler and Color Doppler Indications:     CHF-acute systolic [id number]  History:         Patient has no prior history of Echocardiogram examinations.                  Risk Factors:Hypertension.  Sonographer:     Sherrie Sport Referring Phys:  [id number] [facility] GOEL Diagnosing Phys: Kathlyn Sacramento MD  Sonographer Comments: Technically challenging study due to limited acoustic windows, no apical window and no subcostal window. Image acquisition challenging due to respiratory motion. IMPRESSIONS  1. Left ventricular ejection fraction, by estimation, is 55 to 60%. The left ventricle has normal function. Left ventricular endocardial border not optimally defined to evaluate regional wall motion. Left ventricular diastolic function could not be evaluated.  2. Right ventricular systolic function is  normal. The right ventricular size is normal. Tricuspid regurgitation signal is inadequate for assessing PA pressure.  3. The mitral valve is normal in structure. No evidence of mitral valve regurgitation. No evidence of mitral stenosis.  4. The aortic valve is normal in structure. Aortic valve regurgitation is not visualized. No aortic stenosis is present.  5. Challenging image quality with only limited views. FINDINGS  Left Ventricle: Left ventricular ejection fraction, by estimation, is 55 to 60%. The left ventricle has normal function. Left ventricular endocardial border not optimally defined to evaluate regional wall motion. The left ventricular internal cavity size was normal in size. There is no left ventricular hypertrophy. Left ventricular diastolic function could not be evaluated. Right Ventricle: The right ventricular size is normal. No increase in right ventricular wall thickness. Right ventricular systolic function is normal. Tricuspid regurgitation signal is inadequate for assessing PA pressure. Left Atrium: Left atrial size was normal in size. Right Atrium: Right atrial size was normal in size. Pericardium: There is no evidence of pericardial effusion. Mitral Valve: The mitral valve is normal in structure. No evidence of mitral valve regurgitation. No evidence of mitral valve stenosis. Tricuspid Valve: The tricuspid valve is normal in structure. Tricuspid valve regurgitation is not demonstrated. No evidence of tricuspid stenosis. Aortic Valve: The aortic valve is normal in structure. Aortic valve regurgitation is not visualized. No aortic stenosis is present. Pulmonic Valve: The pulmonic valve was normal in structure. Pulmonic valve regurgitation is not visualized. No evidence of pulmonic stenosis. Aorta: The aortic root is normal in size and structure. Venous: The inferior vena cava was not well visualized. IAS/Shunts: No atrial level shunt detected by color flow Doppler.  LEFT VENTRICLE PLAX 2D  LVIDd:         3.20 cm LVIDs:  2.30 cm LV PW:         1.30 cm LV IVS:        0.80 cm LVOT diam:     2.10 cm LVOT Area:     3.46 cm  LEFT ATRIUM         Index LA diam:    4.00 cm 1.70 cm/m   AORTA Ao Root diam: 3.30 cm  SHUNTS Systemic Diam: 2.10 cm Kathlyn Sacramento MD Electronically signed by Kathlyn Sacramento MD Signature Date/Time: 08/20/2022/2:23:09 PM    Final    CT Angio Chest PE W and/or Wo Contrast  Result Date: 08/19/2022 CLINICAL DATA:  Abdominal pain and chest pain. Concern for pulmonary embolism and bowel obstruction. Metastatic adenocarcinoma of small-bowel. EXAM: CT ANGIOGRAPHY CHEST CT ABDOMEN AND PELVIS WITH CONTRAST TECHNIQUE: Multidetector CT imaging of the chest was performed using the standard protocol during bolus administration of intravenous contrast. Multiplanar CT image reconstructions and MIPs were obtained to evaluate the vascular anatomy. Multidetector CT imaging of the abdomen and pelvis was performed using the standard protocol during bolus administration of intravenous contrast. RADIATION DOSE REDUCTION: This exam was performed according to the departmental dose-optimization program which includes automated exposure control, adjustment of the mA and/or kV according to patient size and/or use of iterative reconstruction technique. CONTRAST:  124m OMNIPAQUE IOHEXOL 350 MG/ML SOLN COMPARISON:  Chest radiograph dated 08/19/2022 and CT dated 08/14/2022. CT abdomen pelvis dated 07/31/2022. FINDINGS: Evaluation of this exam is limited due to respiratory motion artifact. CTA CHEST FINDINGS Cardiovascular: There is no cardiomegaly or pericardial effusion. The thoracic aorta is unremarkable. Evaluation of the pulmonary arteries is limited due to respiratory motion and suboptimal visualization of the peripheral branches. No central pulmonary artery embolus identified. Mediastinum/Nodes: No hilar adenopathy. Mildly rounded subcarinal lymph node measures 8 mm short axis. The esophagus is  grossly unremarkable. No mediastinal fluid collection. Right-sided Port-A-Cath with tip at the cavoatrial junction. Lungs/Pleura: Shallow inspiration. There are bibasilar linear and streaky atelectasis. Multiple bilateral pulmonary nodules measuring up to 8 mm in the upper pole right upper lobe in keeping with known metastatic disease. Trace right pleural effusion. No pneumothorax. The central airways are patent. Musculoskeletal: Degenerative changes of the spine. No acute osseous pathology. Review of the MIP images confirms the above findings. CT ABDOMEN and PELVIS FINDINGS No intra-abdominal free air. Small ascites, increased since the prior CT. Hepatobiliary: Multiple (greater than 20) hepatic hypodense metastatic disease similar or progressed since the prior CT. No calcified gallstone. Pancreas: The pancreas is unremarkable as visualized. Spleen: Normal in size without focal abnormality. Adrenals/Urinary Tract: The adrenal glands unremarkable. There is no hydronephrosis on either side. There is a 7 cm cyst in the upper pole of the left kidney. There is symmetric enhancement and excretion of contrast by both kidneys. The visualized ureters and the urinary bladder is unremarkable. Stomach/Bowel: Ill-defined thickening of the small bowel loop in the mid abdomen as seen on the prior CT in keeping with known malignancy. There is no bowel obstruction. The appendix is normal. Vascular/Lymphatic: Mild aortoiliac atherosclerotic disease. The IVC is unremarkable. No portal venous gas. Periportal adenopathy measure approximately 19 mm in short axis. There is extensive omental nodularity, progressed since the prior CT. An omental mass in the left anterior abdomen measures 8.8 x 3.3 cm. Overall increase in the omental implants since the prior CT. Reproductive: Prostatectomy. Other: Mild subcutaneous edema. Musculoskeletal: Degenerative changes.  No acute osseous pathology. Review of the MIP images confirms the above  findings. IMPRESSION:  1. No acute intrathoracic pathology. No central pulmonary artery embolus identified. 2. Multiple bilateral pulmonary nodules in keeping with known metastatic disease and relatively similar to prior CT of 08/14/2022. 3. Progression of metastatic disease in the abdomen and pelvis since the CT of 07/31/2022. There is increased ascites and progression of omental caking. 4. Ill-defined thickening of the small bowel loop in the mid abdomen in keeping with known malignancy. No bowel obstruction. Normal appendix. 5. Hepatic metastatic disease, similar or progressed since the prior CT. 6.  Aortic Atherosclerosis (ICD10-I70.0). Electronically Signed   By: Anner Crete M.D.   On: 08/19/2022 19:05   CT ABDOMEN PELVIS W CONTRAST  Result Date: 08/19/2022 CLINICAL DATA:  Abdominal pain and chest pain. Concern for pulmonary embolism and bowel obstruction. Metastatic adenocarcinoma of small-bowel. EXAM: CT ANGIOGRAPHY CHEST CT ABDOMEN AND PELVIS WITH CONTRAST TECHNIQUE: Multidetector CT imaging of the chest was performed using the standard protocol during bolus administration of intravenous contrast. Multiplanar CT image reconstructions and MIPs were obtained to evaluate the vascular anatomy. Multidetector CT imaging of the abdomen and pelvis was performed using the standard protocol during bolus administration of intravenous contrast. RADIATION DOSE REDUCTION: This exam was performed according to the departmental dose-optimization program which includes automated exposure control, adjustment of the mA and/or kV according to patient size and/or use of iterative reconstruction technique. CONTRAST:  131m OMNIPAQUE IOHEXOL 350 MG/ML SOLN COMPARISON:  Chest radiograph dated 08/19/2022 and CT dated 08/14/2022. CT abdomen pelvis dated 07/31/2022. FINDINGS: Evaluation of this exam is limited due to respiratory motion artifact. CTA CHEST FINDINGS Cardiovascular: There is no cardiomegaly or pericardial effusion.  The thoracic aorta is unremarkable. Evaluation of the pulmonary arteries is limited due to respiratory motion and suboptimal visualization of the peripheral branches. No central pulmonary artery embolus identified. Mediastinum/Nodes: No hilar adenopathy. Mildly rounded subcarinal lymph node measures 8 mm short axis. The esophagus is grossly unremarkable. No mediastinal fluid collection. Right-sided Port-A-Cath with tip at the cavoatrial junction. Lungs/Pleura: Shallow inspiration. There are bibasilar linear and streaky atelectasis. Multiple bilateral pulmonary nodules measuring up to 8 mm in the upper pole right upper lobe in keeping with known metastatic disease. Trace right pleural effusion. No pneumothorax. The central airways are patent. Musculoskeletal: Degenerative changes of the spine. No acute osseous pathology. Review of the MIP images confirms the above findings. CT ABDOMEN and PELVIS FINDINGS No intra-abdominal free air. Small ascites, increased since the prior CT. Hepatobiliary: Multiple (greater than 20) hepatic hypodense metastatic disease similar or progressed since the prior CT. No calcified gallstone. Pancreas: The pancreas is unremarkable as visualized. Spleen: Normal in size without focal abnormality. Adrenals/Urinary Tract: The adrenal glands unremarkable. There is no hydronephrosis on either side. There is a 7 cm cyst in the upper pole of the left kidney. There is symmetric enhancement and excretion of contrast by both kidneys. The visualized ureters and the urinary bladder is unremarkable. Stomach/Bowel: Ill-defined thickening of the small bowel loop in the mid abdomen as seen on the prior CT in keeping with known malignancy. There is no bowel obstruction. The appendix is normal. Vascular/Lymphatic: Mild aortoiliac atherosclerotic disease. The IVC is unremarkable. No portal venous gas. Periportal adenopathy measure approximately 19 mm in short axis. There is extensive omental nodularity,  progressed since the prior CT. An omental mass in the left anterior abdomen measures 8.8 x 3.3 cm. Overall increase in the omental implants since the prior CT. Reproductive: Prostatectomy. Other: Mild subcutaneous edema. Musculoskeletal: Degenerative changes.  No acute osseous pathology. Review  of the MIP images confirms the above findings. IMPRESSION: 1. No acute intrathoracic pathology. No central pulmonary artery embolus identified. 2. Multiple bilateral pulmonary nodules in keeping with known metastatic disease and relatively similar to prior CT of 08/14/2022. 3. Progression of metastatic disease in the abdomen and pelvis since the CT of 07/31/2022. There is increased ascites and progression of omental caking. 4. Ill-defined thickening of the small bowel loop in the mid abdomen in keeping with known malignancy. No bowel obstruction. Normal appendix. 5. Hepatic metastatic disease, similar or progressed since the prior CT. 6.  Aortic Atherosclerosis (ICD10-I70.0). Electronically Signed   By: Anner Crete M.D.   On: 08/19/2022 19:05   US Venous Img Lower Bilateral  Result Date: 08/19/2022 CLINICAL DATA:  BLE edema EXAM: BILATERAL LOWER EXTREMITY VENOUS DOPPLER ULTRASOUND TECHNIQUE: Gray-scale sonography with compression, as well as color and duplex ultrasound, were performed to evaluate the deep venous system(s) from the level of the common femoral vein through the popliteal and proximal calf veins. COMPARISON:  None Available. FINDINGS: VENOUS Normal compressibility of the common femoral, superficial femoral, and popliteal veins, as well as the visualized calf veins. Visualized portions of profunda femoral vein and great saphenous vein unremarkable. No filling defects to suggest DVT on grayscale or color Doppler imaging. Doppler waveforms show normal direction of venous flow, normal respiratory plasticity and response to augmentation. OTHER None. Limitations: none IMPRESSION: Negative. Electronically Signed    By: Valentino Saxon M.D.   On: 08/19/2022 17:49   DG Chest 2 View  Result Date: 08/19/2022 CLINICAL DATA:  History of liver cancer.  Bilateral leg swelling EXAM: CHEST - 2 VIEW COMPARISON:  CT of the chest August 14, 2022 FINDINGS: Bibasilar atelectasis remains. A right Port-A-Cath is in good position. No pneumothorax. The cardiomediastinal silhouette is stable. No other suspicious findings. IMPRESSION: Bibasilar atelectasis. No other acute abnormalities. Electronically Signed   By: Dorise Bullion III M.D.   On: 08/19/2022 16:23    Disposition Plan & Communication  Patient status: Inpatient  Admitted From: Home Planned disposition location: Home Anticipated discharge date: 3/5 pending improvement of encephalopathy  Family Communication: wife on phone throughout encounter    Author: Richarda Osmond, DO [facility] 08/20/2022, 2:29 PM   Available by Epic secure chat 7AM-7PM. If 7PM-7AM, please contact night-coverage.  [facility] contact information found on [email or web address].

## 2022-08-21 ENCOUNTER — Other Ambulatory Visit: Payer: Self-pay | Admitting: Oncology

## 2022-08-21 DIAGNOSIS — Z515 Encounter for palliative care: Secondary | ICD-10-CM

## 2022-08-21 DIAGNOSIS — C7889 Secondary malignant neoplasm of other digestive organs: Secondary | ICD-10-CM | POA: Diagnosis not present

## 2022-08-21 DIAGNOSIS — R601 Generalized edema: Secondary | ICD-10-CM | POA: Diagnosis not present

## 2022-08-21 DIAGNOSIS — N179 Acute kidney failure, unspecified: Secondary | ICD-10-CM | POA: Diagnosis not present

## 2022-08-21 DIAGNOSIS — E871 Hypo-osmolality and hyponatremia: Secondary | ICD-10-CM | POA: Diagnosis not present

## 2022-08-21 LAB — COMPREHENSIVE METABOLIC PANEL
ALT: 64 U/L — ABNORMAL HIGH (ref 0–44)
AST: 124 U/L — ABNORMAL HIGH (ref 15–41)
Albumin: 1.9 g/dL — ABNORMAL LOW (ref 3.5–5.0)
Alkaline Phosphatase: 284 U/L — ABNORMAL HIGH (ref 38–126)
Anion gap: 6 (ref 5–15)
BUN: 43 mg/dL — ABNORMAL HIGH (ref 8–23)
CO2: 22 mmol/L (ref 22–32)
Calcium: 10.3 mg/dL (ref 8.9–10.3)
Chloride: 94 mmol/L — ABNORMAL LOW (ref 98–111)
Creatinine, Ser: 1.16 mg/dL (ref 0.61–1.24)
GFR, Estimated: >60 mL/min (ref >60)
Glucose, Bld: 111 mg/dL — ABNORMAL HIGH (ref 70–99)
Potassium: 5.3 mmol/L — ABNORMAL HIGH (ref 3.5–5.1)
Sodium: 122 mmol/L — ABNORMAL LOW (ref 135–145)
Total Bilirubin: 5 mg/dL — ABNORMAL HIGH (ref 0.3–1.2)
Total Protein: 6.4 g/dL — ABNORMAL LOW (ref 6.5–8.1)

## 2022-08-21 LAB — CALCIUM, IONIZED: Calcium, Ionized, Serum: 6.3 mg/dL — ABNORMAL HIGH (ref 4.5–5.6)

## 2022-08-21 LAB — CBC
HCT: 31.1 % — ABNORMAL LOW (ref 39.0–52.0)
Hemoglobin: 9.7 g/dL — ABNORMAL LOW (ref 13.0–17.0)
MCH: 23.7 pg — ABNORMAL LOW (ref 26.0–34.0)
MCHC: 31.2 g/dL (ref 30.0–36.0)
MCV: 76 fL — ABNORMAL LOW (ref 80.0–100.0)
Platelets: 691 10*3/uL — ABNORMAL HIGH (ref 150–400)
RBC: 4.09 MIL/uL — ABNORMAL LOW (ref 4.22–5.81)
RDW: 24.2 % — ABNORMAL HIGH (ref 11.5–15.5)
WBC: 21.4 10*3/uL — ABNORMAL HIGH (ref 4.0–10.5)
nRBC: 0 % (ref 0.0–0.2)

## 2022-08-21 LAB — SODIUM
Sodium: 124 mmol/L — ABNORMAL LOW (ref 135–145)
Sodium: 124 mmol/L — ABNORMAL LOW (ref 135–145)

## 2022-08-21 LAB — PARATHYROID HORMONE, INTACT (NO CA): PTH: 8 pg/mL — ABNORMAL LOW (ref 15–65)

## 2022-08-21 LAB — GLUCOSE, CAPILLARY: Glucose-Capillary: 113 mg/dL — ABNORMAL HIGH (ref 70–99)

## 2022-08-21 LAB — AMMONIA: Ammonia: 35 umol/L (ref 9–35)

## 2022-08-21 MED ORDER — SODIUM ZIRCONIUM CYCLOSILICATE 5 G PO PACK
5.0000 g | PACK | Freq: Once | ORAL | Status: AC
  Administered 2022-08-21: 5 g via ORAL
  Filled 2022-08-21: qty 1

## 2022-08-21 MED ORDER — LACTULOSE 10 GM/15ML PO SOLN
10.0000 g | Freq: Two times a day (BID) | ORAL | Status: DC
  Administered 2022-08-21 – 2022-08-25 (×9): 10 g via ORAL
  Filled 2022-08-21 (×9): qty 30

## 2022-08-21 MED ORDER — SODIUM CHLORIDE 0.9 % IV SOLN: INTRAVENOUS | Status: DC

## 2022-08-21 NOTE — Progress Notes (Addendum)
PROGRESS NOTE  Philip Arnold.    DOB: 02/29/1961, 62 y.o.  [id number]    Code Status: Full Code   DOA: 08/19/2022   LOS: 2   Brief hospital course  Philip Lullo. is a 62 y.o. male with a PMH significant for metastatic carcinoma of small bowel, HTN anemia, prostate cancer, obesity, HLD, .  They presented from home to the ED on 08/19/2022 with generalized weakness and worsened swelling x 4 days. Also endorses poor appetite.  Had liver biopsy 08/06/22 which showed metastatic adenocarcinoma. He was due to start therapy outpatient at cancer center 3/4. Dr. Grayland Ormond aware of admission.   In the ED, it was found that they had stable vital signs ORA. He did later develop some mild hypoxia and required 3L [place].  Significant findings included WBC 19.9, hgb 10.7, platelets 811, PT/INR 16.8/1.4troponin neg, Na+ 124, K+ 5.6, Cl- 91, glucose 122, Cr 1.31, albumin 2.1, AST/ALT 138/64, alk phos 372, total bili 5.7, BNP 41.5, lipase 64, Mg+ 2.8. Blood cultures were collected. Chest xray: bibasilar atelectasis LE doppler: negative DVT CTA chest: showed known metastatic nodules without PE CT abdomen pelvis significant findings: Progression of metastatic disease in the abdomen and pelvis since the CT of 07/31/2022. There is increased ascites and progression of omental caking.  Ill-defined thickening of the small bowel loop in the mid abdomen in keeping with known malignancy. No bowel obstruction.  Hepatic metastatic disease, similar or progressed since the prior CT.  They were initially treated with CTX, flagyl. Heme/onc was made aware of admission to consult.    Patient was admitted to medicine service for further workup and management of generalized weakness as outlined in detail below.  3/4: conservative management with fluid restriction and observation. Avoiding IV fluid due to overall hypervolemic status if possible.  08/21/22 -stable. Starting gentle IV fluids for worsening hyponatremia.   UPDATE: Na+ is improving on NS at appropriate rate so will continue NS for now with frequent electrolyte monitoring. 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 Suspicion for possible hepatic encephalopathy. Ammonia was mildly elevated yesterday and improved today. Continues to have mild asterixis  - starting lactulose - registered dietician consult - electrolyte management  - avoid sedating medications - PT/OT- recommending SNF at dc. Dispo plan pending family discussion with palliative for GOC  Anasarca- given abdominal size and increased interval pleural fluid on imaging, I was expecting to order paracentesis but patient has no tenderness to palpation on exam so continue to hold off. 1+ pitting edema bilateral LE. From liver disease/leaky gut. Echo is unremarkable.  With fluid restriction alone, patient had worsening of hyponatremia so will attempt gentle IVF today to address since his echo was normal and abdomen is non-tender. Will need to watch fluid status carefully and  - continue fluid restrict - diuresis PRN, lasix can worsen hyponatremia - strict I/O - daily weights  Acute hypoxic respiratory failure- resolved. currently on room air. due to volume overload although no overt congestion on xray and lungs clear on exam. May be also contributed by  hiccups.  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 Was scheduled to start outpatient treatment 3/4 but was  inpatient for acute weakness instead. - heme/onc following, appreciate care  - palliative to see today - follow liver labs which have mild improvement today - supportive care PRN - heparin DVT ppx for hypercoagulable state  Hyponatremia- chronic. Moderate and possible mild symptomatic contributing to his overall weakness. His Na+ has been low for extended time so no acute change but mildly worsening today Na+ 125>127>122. Initially just fluid restricted to avoid fluid overload but now going to attempt gentle NS replacement and frequent Na+ rechecks with goal to improve 4-16mol per 24 hour period. May have to change to hypertonic solution if not improving. - q6hr Na+ checks - NS and transition to hypertonic solution if worsening on recheck.   - goal to increase 4-662ml per 24 hour period - hold HCTZ - fluid restrict - frequent neuro checks   Hyperkalemia- K+ 5.4>5.0>5.3. Hemodynamically stable - given albuterol, lokelma.  - monitor  Hypercalcemia- resolved - f/u PTH, ionized calcium   Leukocytosis- acute reaction. no overt signs of infection.  - monitor blood cultures, NGTD - not continuing Abx at this time   TSH elevation- free T4 elevated 1.19   Hypermagnesemia- improving. Mg++ 2.8>2.5 Due to AKI, trend maintain on telemetry   Thrombocytosis Heme-onc evaluation  AKI- resolved  Body mass index is 35.91 kg/m.  VTE ppx: heparin injection 5,000 Units Start: 08/20/22 2200 SCDs Start: 08/19/22 2224  Diet:     Diet   Diet renal with fluid restriction Fluid restriction: 1200 mL Fluid; Room service appropriate? Yes; Fluid consistency: Thin   Consultants: Heme/onc  Subjective 08/21/22    Pt reports no concerns today. Denies N/V/D or abdominal pain. Denies CP, SOB.  Spoke with wife on phone and addressed her questions.    Objective   Vitals:   08/20/22 1528 08/20/22 1633 08/20/22 2012 08/21/22 0511  BP:  107/71 103/73 120/73  Pulse:  (!) 103 99 91  Resp:  '17 20 18   '$ Temp:  97.8 F (36.6 C) 97.6 F (36.4 C) 97.7 F (36.5 C)  TempSrc:      SpO2: 94% 97% 98% 96%  Weight:      Height:        Intake/Output Summary (Last 24 hours) at 08/21/2022 0728 Last data filed at 08/20/2022 1643 Gross per 24 hour  Intake 400 ml  Output 500 ml  Net -100 ml    Filed Weights   08/19/22 1552 08/19/22 2144  Weight: 112.5 kg 116.8 kg     Physical Exam:  General: awake, alert, NAD, tired-appearing HEENT: atraumatic, clear conjunctiva, anicteric sclera, MMM, hearing grossly normal Respiratory: normal respiratory effort. CTAB Cardiovascular: quick capillary refill, normal S1/S2, RRR, mild JVD, no murmurs Gastrointestinal: obese, non-tender to palpation. Several healed surgical scars. Positive mild fluid wave Nervous: A&O x3. no gross focal neurologic deficits, normal speech Extremities: moves all equally, 1+ pitting pedal edema, normal tone Skin: dry, intact, normal temperature, normal color. No rashes, lesions or ulcers on exposed skin Psychiatry: flat mood, congruent affect  Labs   I have personally reviewed the following labs and imaging studies CBC    Component Value Date/Time   WBC 21.4 (H) 08/21/2022 0359   RBC 4.09 (L) 08/21/2022 0359   HGB 9.7 (L) 08/21/2022 0359   HCT 31.1 (L) 08/21/2022 0359   PLT 691 (H) 08/21/2022  0359   MCV 76.0 (L) 08/21/2022 0359   MCH 23.7 (L) 08/21/2022 0359   MCHC 31.2 08/21/2022 0359   RDW 24.2 (H) 08/21/2022 0359   LYMPHSABS 1.5 07/31/2022 0900   MONOABS 1.6 (H) 07/31/2022 0900   EOSABS 0.1 07/31/2022 0900   BASOSABS 0.1 07/31/2022 0900      Latest Ref Rng & Units 08/21/2022    3:59 AM 08/20/2022    4:53 AM 08/19/2022   10:56 PM  BMP  Glucose 70 - 99 mg/dL 111  99  101   BUN 8 - 23 mg/dL 43  38  39   Creatinine 0.61 - 1.24 mg/dL 1.16  0.99  1.10   Sodium 135 - 145 mmol/L 122  127  125   Potassium 3.5 - 5.1 mmol/L 5.3  5.0  5.4   Chloride 98 - 111 mmol/L 94  95  94   CO2 22 - 32 mmol/L '22  21  22   '$ Calcium 8.9 -  10.3 mg/dL 10.3  10.8  10.8     ECHOCARDIOGRAM COMPLETE  Result Date: 08/20/2022    ECHOCARDIOGRAM REPORT   Patient Name:   Philip Klingerman. Date of Exam: 08/20/2022 Medical Rec #:  [id number]            Height:       71.0 in Accession #:    [id number]           Weight:       257.5 lb Date of Birth:  [birth year]            BSA:          2.348 m Patient Age:    50 years             BP:           117/69 mmHg Patient Gender: M                    HR:           104 bpm. Exam Location:  [facility] Procedure: 2D Echo, Cardiac Doppler and Color Doppler Indications:     CHF-acute systolic [id number]  History:         Patient has no prior history of Echocardiogram examinations.                  Risk Factors:Hypertension.  Sonographer:     Sherrie Sport Referring Phys:  [id number] [facility] Diagnosing Phys: Kathlyn Sacramento MD  Sonographer Comments: Technically challenging study due to limited acoustic windows, no apical window and no subcostal window. Image acquisition challenging due to respiratory motion. IMPRESSIONS  1. Left ventricular ejection fraction, by estimation, is 55 to 60%. The left ventricle has normal function. Left ventricular endocardial border not optimally defined to evaluate regional wall motion. Left ventricular diastolic function could not be evaluated.  2. Right ventricular systolic function is normal. The right ventricular size is normal. Tricuspid regurgitation signal is inadequate for assessing PA pressure.  3. The mitral valve is normal in structure. No evidence of mitral valve regurgitation. No evidence of mitral stenosis.  4. The aortic valve is normal in structure. Aortic valve regurgitation is not visualized. No aortic stenosis is present.  5. Challenging image quality with only limited views. FINDINGS  Left Ventricle: Left ventricular ejection fraction, by estimation, is 55 to 60%. The left ventricle has normal function. Left ventricular endocardial border not optimally defined to evaluate regional wall  motion. The left ventricular  internal cavity size was normal in size. There is no left ventricular hypertrophy. Left ventricular diastolic function could not be evaluated. Right Ventricle: The right ventricular size is normal. No increase in right ventricular wall thickness. Right ventricular systolic function is normal. Tricuspid regurgitation signal is inadequate for assessing PA pressure. Left Atrium: Left atrial size was normal in size. Right Atrium: Right atrial size was normal in size. Pericardium: There is no evidence of pericardial effusion. Mitral Valve: The mitral valve is normal in structure. No evidence of mitral valve regurgitation. No evidence of mitral valve stenosis. Tricuspid Valve: The tricuspid valve is normal in structure. Tricuspid valve regurgitation is not demonstrated. No evidence of tricuspid stenosis. Aortic Valve: The aortic valve is normal in structure. Aortic valve regurgitation is not visualized. No aortic stenosis is present. Pulmonic Valve: The pulmonic valve was normal in structure. Pulmonic valve regurgitation is not visualized. No evidence of pulmonic stenosis. Aorta: The aortic root is normal in size and structure. Venous: The inferior vena cava was not well visualized. IAS/Shunts: No atrial level shunt detected by color flow Doppler.  LEFT VENTRICLE PLAX 2D LVIDd:         3.20 cm LVIDs:         2.30 cm LV PW:         1.30 cm LV IVS:        0.80 cm LVOT diam:     2.10 cm LVOT Area:     3.46 cm  LEFT ATRIUM         Index LA diam:    4.00 cm 1.70 cm/m   AORTA Ao Root diam: 3.30 cm  SHUNTS Systemic Diam: 2.10 cm Kathlyn Sacramento MD Electronically signed by Kathlyn Sacramento MD Signature Date/Time: 08/20/2022/2:23:09 PM    Final    CT Angio Chest PE W and/or Wo Contrast  Result Date: 08/19/2022 CLINICAL DATA:  Abdominal pain and chest pain. Concern for pulmonary embolism and bowel obstruction. Metastatic adenocarcinoma of small-bowel. EXAM: CT ANGIOGRAPHY CHEST CT ABDOMEN AND PELVIS  WITH CONTRAST TECHNIQUE: Multidetector CT imaging of the chest was performed using the standard protocol during bolus administration of intravenous contrast. Multiplanar CT image reconstructions and MIPs were obtained to evaluate the vascular anatomy. Multidetector CT imaging of the abdomen and pelvis was performed using the standard protocol during bolus administration of intravenous contrast. RADIATION DOSE REDUCTION: This exam was performed according to the departmental dose-optimization program which includes automated exposure control, adjustment of the mA and/or kV according to patient size and/or use of iterative reconstruction technique. CONTRAST:  156m OMNIPAQUE IOHEXOL 350 MG/ML SOLN COMPARISON:  Chest radiograph dated 08/19/2022 and CT dated 08/14/2022. CT abdomen pelvis dated 07/31/2022. FINDINGS: Evaluation of this exam is limited due to respiratory motion artifact. CTA CHEST FINDINGS Cardiovascular: There is no cardiomegaly or pericardial effusion. The thoracic aorta is unremarkable. Evaluation of the pulmonary arteries is limited due to respiratory motion and suboptimal visualization of the peripheral branches. No central pulmonary artery embolus identified. Mediastinum/Nodes: No hilar adenopathy. Mildly rounded subcarinal lymph node measures 8 mm short axis. The esophagus is grossly unremarkable. No mediastinal fluid collection. Right-sided Port-A-Cath with tip at the cavoatrial junction. Lungs/Pleura: Shallow inspiration. There are bibasilar linear and streaky atelectasis. Multiple bilateral pulmonary nodules measuring up to 8 mm in the upper pole right upper lobe in keeping with known metastatic disease. Trace right pleural effusion. No pneumothorax. The central airways are patent. Musculoskeletal: Degenerative changes of the spine. No acute osseous pathology. Review of the MIP images  confirms the above findings. CT ABDOMEN and PELVIS FINDINGS No intra-abdominal free air. Small ascites, increased  since the prior CT. Hepatobiliary: Multiple (greater than 20) hepatic hypodense metastatic disease similar or progressed since the prior CT. No calcified gallstone. Pancreas: The pancreas is unremarkable as visualized. Spleen: Normal in size without focal abnormality. Adrenals/Urinary Tract: The adrenal glands unremarkable. There is no hydronephrosis on either side. There is a 7 cm cyst in the upper pole of the left kidney. There is symmetric enhancement and excretion of contrast by both kidneys. The visualized ureters and the urinary bladder is unremarkable. Stomach/Bowel: Ill-defined thickening of the small bowel loop in the mid abdomen as seen on the prior CT in keeping with known malignancy. There is no bowel obstruction. The appendix is normal. Vascular/Lymphatic: Mild aortoiliac atherosclerotic disease. The IVC is unremarkable. No portal venous gas. Periportal adenopathy measure approximately 19 mm in short axis. There is extensive omental nodularity, progressed since the prior CT. An omental mass in the left anterior abdomen measures 8.8 x 3.3 cm. Overall increase in the omental implants since the prior CT. Reproductive: Prostatectomy. Other: Mild subcutaneous edema. Musculoskeletal: Degenerative changes.  No acute osseous pathology. Review of the MIP images confirms the above findings. IMPRESSION: 1. No acute intrathoracic pathology. No central pulmonary artery embolus identified. 2. Multiple bilateral pulmonary nodules in keeping with known metastatic disease and relatively similar to prior CT of 08/14/2022. 3. Progression of metastatic disease in the abdomen and pelvis since the CT of 07/31/2022. There is increased ascites and progression of omental caking. 4. Ill-defined thickening of the small bowel loop in the mid abdomen in keeping with known malignancy. No bowel obstruction. Normal appendix. 5. Hepatic metastatic disease, similar or progressed since the prior CT. 6.  Aortic Atherosclerosis  (ICD10-I70.0). Electronically Signed   By: Anner Crete M.D.   On: 08/19/2022 19:05   CT ABDOMEN PELVIS W CONTRAST  Result Date: 08/19/2022 CLINICAL DATA:  Abdominal pain and chest pain. Concern for pulmonary embolism and bowel obstruction. Metastatic adenocarcinoma of small-bowel. EXAM: CT ANGIOGRAPHY CHEST CT ABDOMEN AND PELVIS WITH CONTRAST TECHNIQUE: Multidetector CT imaging of the chest was performed using the standard protocol during bolus administration of intravenous contrast. Multiplanar CT image reconstructions and MIPs were obtained to evaluate the vascular anatomy. Multidetector CT imaging of the abdomen and pelvis was performed using the standard protocol during bolus administration of intravenous contrast. RADIATION DOSE REDUCTION: This exam was performed according to the departmental dose-optimization program which includes automated exposure control, adjustment of the mA and/or kV according to patient size and/or use of iterative reconstruction technique. CONTRAST:  138m OMNIPAQUE IOHEXOL 350 MG/ML SOLN COMPARISON:  Chest radiograph dated 08/19/2022 and CT dated 08/14/2022. CT abdomen pelvis dated 07/31/2022. FINDINGS: Evaluation of this exam is limited due to respiratory motion artifact. CTA CHEST FINDINGS Cardiovascular: There is no cardiomegaly or pericardial effusion. The thoracic aorta is unremarkable. Evaluation of the pulmonary arteries is limited due to respiratory motion and suboptimal visualization of the peripheral branches. No central pulmonary artery embolus identified. Mediastinum/Nodes: No hilar adenopathy. Mildly rounded subcarinal lymph node measures 8 mm short axis. The esophagus is grossly unremarkable. No mediastinal fluid collection. Right-sided Port-A-Cath with tip at the cavoatrial junction. Lungs/Pleura: Shallow inspiration. There are bibasilar linear and streaky atelectasis. Multiple bilateral pulmonary nodules measuring up to 8 mm in the upper pole right upper lobe  in keeping with known metastatic disease. Trace right pleural effusion. No pneumothorax. The central airways are patent. Musculoskeletal: Degenerative changes of the  spine. No acute osseous pathology. Review of the MIP images confirms the above findings. CT ABDOMEN and PELVIS FINDINGS No intra-abdominal free air. Small ascites, increased since the prior CT. Hepatobiliary: Multiple (greater than 20) hepatic hypodense metastatic disease similar or progressed since the prior CT. No calcified gallstone. Pancreas: The pancreas is unremarkable as visualized. Spleen: Normal in size without focal abnormality. Adrenals/Urinary Tract: The adrenal glands unremarkable. There is no hydronephrosis on either side. There is a 7 cm cyst in the upper pole of the left kidney. There is symmetric enhancement and excretion of contrast by both kidneys. The visualized ureters and the urinary bladder is unremarkable. Stomach/Bowel: Ill-defined thickening of the small bowel loop in the mid abdomen as seen on the prior CT in keeping with known malignancy. There is no bowel obstruction. The appendix is normal. Vascular/Lymphatic: Mild aortoiliac atherosclerotic disease. The IVC is unremarkable. No portal venous gas. Periportal adenopathy measure approximately 19 mm in short axis. There is extensive omental nodularity, progressed since the prior CT. An omental mass in the left anterior abdomen measures 8.8 x 3.3 cm. Overall increase in the omental implants since the prior CT. Reproductive: Prostatectomy. Other: Mild subcutaneous edema. Musculoskeletal: Degenerative changes.  No acute osseous pathology. Review of the MIP images confirms the above findings. IMPRESSION: 1. No acute intrathoracic pathology. No central pulmonary artery embolus identified. 2. Multiple bilateral pulmonary nodules in keeping with known metastatic disease and relatively similar to prior CT of 08/14/2022. 3. Progression of metastatic disease in the abdomen and pelvis  since the CT of 07/31/2022. There is increased ascites and progression of omental caking. 4. Ill-defined thickening of the small bowel loop in the mid abdomen in keeping with known malignancy. No bowel obstruction. Normal appendix. 5. Hepatic metastatic disease, similar or progressed since the prior CT. 6.  Aortic Atherosclerosis (ICD10-I70.0). Electronically Signed   By: Anner Crete M.D.   On: 08/19/2022 19:05   US Venous Img Lower Bilateral  Result Date: 08/19/2022 CLINICAL DATA:  BLE edema EXAM: BILATERAL LOWER EXTREMITY VENOUS DOPPLER ULTRASOUND TECHNIQUE: Gray-scale sonography with compression, as well as color and duplex ultrasound, were performed to evaluate the deep venous system(s) from the level of the common femoral vein through the popliteal and proximal calf veins. COMPARISON:  None Available. FINDINGS: VENOUS Normal compressibility of the common femoral, superficial femoral, and popliteal veins, as well as the visualized calf veins. Visualized portions of profunda femoral vein and great saphenous vein unremarkable. No filling defects to suggest DVT on grayscale or color Doppler imaging. Doppler waveforms show normal direction of venous flow, normal respiratory plasticity and response to augmentation. OTHER None. Limitations: none IMPRESSION: Negative. Electronically Signed   By: Valentino Saxon M.D.   On: 08/19/2022 17:49   DG Chest 2 View  Result Date: 08/19/2022 CLINICAL DATA:  History of liver cancer.  Bilateral leg swelling EXAM: CHEST - 2 VIEW COMPARISON:  CT of the chest August 14, 2022 FINDINGS: Bibasilar atelectasis remains. A right Port-A-Cath is in good position. No pneumothorax. The cardiomediastinal silhouette is stable. No other suspicious findings. IMPRESSION: Bibasilar atelectasis. No other acute abnormalities. Electronically Signed   By: Dorise Bullion III M.D.   On: 08/19/2022 16:23    Disposition Plan & Communication  Patient status: Inpatient  Admitted From:  Home Planned disposition location: Home Anticipated discharge date: 3/6 pending improvement of encephalopathy, electrolytes  Family Communication: wife on phone    Author: Richarda Osmond, DO [facility] 08/21/2022, 7:28 AM   Available by [employer]  secure chat 7AM-7PM. If 7PM-7AM, please contact night-coverage.  [facility] contact information found on [email or web address].

## 2022-08-21 NOTE — TOC Progression Note (Signed)
Transition of Care (TOC) - Progression Note    Patient Details  Name: Philip Arnold. MRN: [id number] Date of Birth: September 18, 1960  Transition of Care [facility]) CM/SW Contact  Gerilyn Pilgrim, LCSW Phone Number: 08/21/2022, 3:54 PM  Clinical Narrative:  CSW spoke with wife regarding SNF recommendation. Wife reports pt has never went to a SNF before put states that she would like referrals sent to facilities near the hospital and she will choose from the accepting facility.            Expected Discharge Plan and Services                                               Social Determinants of Health (SDOH) Interventions SDOH Screenings   Food Insecurity: No Food Insecurity (08/19/2022)  Housing: Low Risk  (08/19/2022)  Transportation Needs: No Transportation Needs (08/19/2022)  Utilities: Not At Risk (08/19/2022)  Tobacco Use: Low Risk  (08/19/2022)    Readmission Risk Interventions     No data to display

## 2022-08-21 NOTE — Progress Notes (Signed)
Physical Therapy Treatment Patient Details Name: Philip Arnold. MRN: [id number] DOB: 10/19/1960 Today's Date: 08/21/2022   History of Present Illness Pt is a 62 y.o. male with PMH significant for metastatic carcinoma of small bowel, HTN anemia, prostate cancer, obesity, HLD, and hyperglycemia who presented to the ED with generalized weakness, poor appetite as well as development of lower extremity swelling and abdominal distention. 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    PT Comments    Pt was supine in bed upon arrival. He is alert and cooperative. Requires increased response time but was able to follow all commands and perform all desired task with increased time. Pt was able to exit L side of bed, stand to RW, and tolerate ambulation ~ 40 ft. Continues to be severely deconditioned and is far from his baseline abilities. Pt is improving but slowly. He was able to maintain sao2 > 94% on rm air today. Author highly recommends DC to SNF to maximize his independence while decreasing caregiver burden.   Recommendations for follow up therapy are one component of a multi-disciplinary discharge planning process, led by the attending physician.  Recommendations may be updated based on patient status, additional functional criteria and insurance authorization.  Follow Up Recommendations  Skilled nursing-short term rehab (<3 hours/day)     Assistance Recommended at Discharge Frequent or constant Supervision/Assistance  Patient can return home with the following A little help with bathing/dressing/bathroom;Assistance with cooking/housework;Direct supervision/assist for medications management;Assist for transportation;Help with stairs or ramp for entrance;A little help with walking and/or  transfers   Equipment Recommendations  Other (comment) (defer to next level of care)       Precautions / Restrictions Precautions Precautions: Fall Restrictions Weight Bearing Restrictions: No     Mobility  Bed Mobility Overal bed mobility: Needs Assistance Bed Mobility: Supine to Sit  Supine to sit: Min guard, Min assist Sit to supine: Min assist     Transfers Overall transfer level: Needs assistance Equipment used: Rolling walker (2 wheels) Transfers: Sit to/from Stand Sit to Stand: Min assist     Ambulation/Gait Ambulation/Gait assistance: Min guard Gait Distance (Feet): 40 Feet Assistive device: Rolling walker (2 wheels) Gait Pattern/deviations: Step-through pattern, Decreased step length - right, Decreased step length - left, Trunk flexed Gait velocity: decreased   General Gait Details: pt has very slow step to gait pattern with flexed posture. ambulated ~ 20 ft and needed to turn around due to fatigue. no LOB but does require prolonged recovery time. sao2 > 92% on rm air    Balance Overall balance assessment: Needs assistance Sitting-balance support: Feet supported Sitting balance-[name]: Good   Standing balance support: Bilateral upper extremity supported, During functional activity, Reliant on assistive device for balance Standing balance-[name]: Fair       Cognition Arousal/Alertness: Awake/alert Behavior During Therapy: WFL for tasks assessed/performed Overall Cognitive Status: No family/caregiver present to determine baseline cognitive functioning Area of Impairment: Following commands    Following Commands: Follows multi-step commands with increased time    General Comments: Pt is alert and cooperative. slow response time however was able to correctly answer questions throughout               Pertinent Vitals/Pain Pain Assessment Pain Assessment: 0-10 Pain Score: 3  Pain Location: stomach Pain Intervention(s): Limited activity  within patient's tolerance, Monitored during session, Repositioned  PT Goals (current goals can now be found in the care plan section) Acute Rehab PT Goals Patient Stated Goal: To get stronger Progress towards PT goals: Progressing toward goals    Frequency    Min 2X/week      PT Plan Current plan remains appropriate       AM-PAC PT "6 Clicks" Mobility   Outcome Measure  Help needed turning from your back to your side while in a flat bed without using bedrails?: A Little Help needed moving from lying on your back to sitting on the side of a flat bed without using bedrails?: A Little Help needed moving to and from a bed to a chair (including a wheelchair)?: A Little Help needed standing up from a chair using your arms (e.g., wheelchair or bedside chair)?: A Little Help needed to walk in hospital room?: A Lot Help needed climbing 3-5 steps with a railing? : A Lot 6 Click Score: 16    End of Session   Activity Tolerance: Patient tolerated treatment well;Patient limited by fatigue;Patient limited by pain Patient left: in bed;with call bell/phone within reach;with bed alarm set;with family/visitor present Nurse Communication: Mobility status PT Visit Diagnosis: Unsteadiness on feet (R26.81);Difficulty in walking, not elsewhere classified (R26.2);Muscle weakness (generalized) (M62.81)     Time: [id number] PT Time Calculation (min) (ACUTE ONLY): 24 min  Charges:  $Gait Training: 8-22 mins $Therapeutic Activity: 8-22 mins                    Julaine Fusi PTA 08/21/22, 4:41 PM

## 2022-08-21 NOTE — Consult Note (Signed)
[place] at [facility] [phone] [phone]   Name: Philip Arnold. Date: 08/21/2022 MRN: [id number]  DOB: 1960/10/02  Patient Care Team: [name], MD as PCP - General (Internal Medicine) Clent Jacks, RN as Oncology Nurse Navigator    REASON FOR CONSULTATION: Philip Arnold. is a 62 y.o. 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  Plan to initiate chemotherapy with FOLFOX plus Bev but was admitted to the hospital on 08/19/2022 with progressive weakness, metabolic encephalopathy, and anasarca.  Palliative care was consulted to address goals.   SOCIAL HISTORY:     reports that he has never smoked. He has never used smokeless tobacco. He reports that he does not currently use alcohol. He reports that he does not use drugs.  Patient is married at home with his wife.  He has an adult son and daughter.  Patient retired from a factory maintaining oxygen tanks.  ADVANCE DIRECTIVES:  Does not have  CODE STATUS: Full code  PAST MEDICAL HISTORY: Past Medical History:  Diagnosis Date   Cancer ([place])    Prostate Cancer   Elevated PSA    GERD (gastroesophageal reflux disease)    Hip pain    History of kidney stones    Hyperglycemia    Hypertension    Obesity     PAST SURGICAL HISTORY:  Past Surgical History:  Procedure Laterality Date   COLONOSCOPY WITH PROPOFOL     IR IMAGING GUIDED PORT INSERTION  08/13/2022   PELVIC LYMPH NODE DISSECTION Bilateral 04/24/2019   Procedure: PELVIC LYMPH NODE DISSECTION;  Surgeon: Hollice Espy, MD;  Location: [facility] ORS;  Service: Urology;  Laterality: Bilateral;   ROBOT ASSISTED LAPAROSCOPIC RADICAL PROSTATECTOMY N/A 04/24/2019   Procedure: XI ROBOTIC ASSISTED LAPAROSCOPIC RADICAL PROSTATECTOMY;  Surgeon: Hollice Espy, MD;  Location: [facility] ORS;  Service: Urology;  Laterality: N/A;    HEMATOLOGY/ONCOLOGY  HISTORY:  Oncology History  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[place])  07/31/2022 Initial Diagnosis   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[place])   08/08/2022 Cancer Staging   Staging form: Small Intestine - Adenocarcinoma, AJCC 8th Edition - Clinical stage from 08/08/2022: Stage IV (cTX, cN2, pM1) - Signed by Lloyd Huger, MD on 08/08/2022 Stage prefix: Initial diagnosis   08/27/2022 -  Chemotherapy   Patient is on Treatment Plan : COLORECTAL FOLFOX + Bevacizumab q14d       ALLERGIES:  has No Known Allergies.  MEDICATIONS:  Current Facility-Administered Medications  Medication Dose Route Frequency Provider Last Rate Last Admin   0.9 %  sodium chloride infusion   Intravenous Continuous Richarda Osmond, MD 75 mL/hr at 08/21/22 0859 New Bag at 08/21/22 0859   albuterol (PROVENTIL) (2.5 MG/3ML) 0.083% nebulizer solution 2.5 mg  2.5 mg Inhalation Q4H PRN Gertie Fey, MD   2.5 mg at 08/21/22 0804   aspirin EC tablet 81 mg  81 mg Oral Daily Gertie Fey, MD   81 mg at 08/21/22 0859   cholecalciferol (VITAMIN D3) 25 MCG (1000 UNIT) tablet 5,000 Units  5,000 Units Oral Daily Gertie Fey, MD   5,000 Units at 08/21/22 0859   heparin injection 5,000 Units  5,000 Units Subcutaneous Q8H Lorin Picket, RPH   5,000 Units at 08/21/22 1410   lactulose (CHRONULAC) 10 GM/15ML solution 10 g  10 g Oral BID Richarda Osmond, MD   10 g at 08/21/22 0859   metoprolol succinate (TOPROL-XL)  24 hr tablet 100 mg  100 mg Oral Daily Gertie Fey, MD   100 mg at 08/21/22 [id number]   oxyCODONE (Oxy IR/ROXICODONE) immediate release tablet 5 mg  5 mg Oral Q4H PRN Gertie Fey, MD       pantoprazole (PROTONIX) EC tablet 40 mg  40 mg Oral Daily Gertie Fey, MD   40 mg at 08/21/22 0859   prochlorperazine (COMPAZINE) tablet 10 mg  10 mg Oral Q6H PRN Gertie Fey, MD       simvastatin (ZOCOR) tablet 20 mg  20 mg Oral QHS Gertie Fey, MD   20 mg at 08/20/22 2159   sodium chloride flush (NS) 0.9 % injection 3 mL  3 mL Intravenous Q12H Gertie Fey, MD   3 mL at 08/21/22 0900    VITAL SIGNS: BP 112/80 (BP Location: Right Arm)   Pulse 87   Temp 99.6 F (37.6 C)   Resp 16   Ht '5\' 11"'$  (1.803 m)   Wt 257 lb 15 oz (117 kg)   SpO2 97%   BMI 35.98 kg/m  Filed Weights   08/19/22 1552 08/19/22 2144 08/21/22 1400  Weight: 248 lb (112.5 kg) 257 lb 8 oz (116.8 kg) 257 lb 15 oz (117 kg)    Estimated body mass index is 35.98 kg/m as calculated from the following:   Height as of this encounter: '5\' 11"'$  (1.803 m).   Weight as of this encounter: 257 lb 15 oz (117 kg).  LABS: CBC:    Component Value Date/Time   WBC 21.4 (H) 08/21/2022 0359   HGB 9.7 (L) 08/21/2022 0359   HCT 31.1 (L) 08/21/2022 0359   PLT 691 (H) 08/21/2022 0359   MCV 76.0 (L) 08/21/2022 0359   NEUTROABS 13.9 (H) 07/31/2022 0900   LYMPHSABS 1.5 07/31/2022 0900   MONOABS 1.6 (H) 07/31/2022 0900   EOSABS 0.1 07/31/2022 0900   BASOSABS 0.1 07/31/2022 0900   Comprehensive Metabolic Panel:    Component Value Date/Time   NA 124 (L) 08/21/2022 1133   K 5.3 (H) 08/21/2022 0359   CL 94 (L) 08/21/2022 0359   CO2 22 08/21/2022 0359   BUN 43 (H) 08/21/2022 0359   CREATININE 1.16 08/21/2022 0359   GLUCOSE 111 (H) 08/21/2022 0359   CALCIUM 10.3 08/21/2022 0359   AST 124 (H) 08/21/2022 0359   ALT 64 (H) 08/21/2022 0359   ALKPHOS 284 (H) 08/21/2022 0359   BILITOT 5.0 (H) 08/21/2022 0359   PROT 6.4 (L) 08/21/2022 0359   ALBUMIN 1.9 (L) 08/21/2022 0359    RADIOGRAPHIC STUDIES: ECHOCARDIOGRAM COMPLETE  Result Date: 08/20/2022    ECHOCARDIOGRAM REPORT   Patient Name:   Philip Arnold. Date of Exam: 08/20/2022 Medical Rec #:  [id number]            Height:       71.0 in Accession #:    [id number]           Weight:       257.5 lb Date of Birth:  [birth year]            BSA:          2.348 m Patient Age:    8 years             BP:           117/69 mmHg Patient Gender: M                    HR:  104 bpm. Exam Location:  [facility] Procedure: 2D Echo, Cardiac Doppler and  Color Doppler Indications:     CHF-acute systolic [id number]  History:         Patient has no prior history of Echocardiogram examinations.                  Risk Factors:Hypertension.  Sonographer:     Sherrie Sport Referring Phys:  [id number] [employer] GOEL Diagnosing Phys: Kathlyn Sacramento MD  Sonographer Comments: Technically challenging study due to limited acoustic windows, no apical window and no subcostal window. Image acquisition challenging due to respiratory motion. IMPRESSIONS  1. Left ventricular ejection fraction, by estimation, is 55 to 60%. The left ventricle has normal function. Left ventricular endocardial border not optimally defined to evaluate regional wall motion. Left ventricular diastolic function could not be evaluated.  2. Right ventricular systolic function is normal. The right ventricular size is normal. Tricuspid regurgitation signal is inadequate for assessing PA pressure.  3. The mitral valve is normal in structure. No evidence of mitral valve regurgitation. No evidence of mitral stenosis.  4. The aortic valve is normal in structure. Aortic valve regurgitation is not visualized. No aortic stenosis is present.  5. Challenging image quality with only limited views. FINDINGS  Left Ventricle: Left ventricular ejection fraction, by estimation, is 55 to 60%. The left ventricle has normal function. Left ventricular endocardial border not optimally defined to evaluate regional wall motion. The left ventricular internal cavity size was normal in size. There is no left ventricular hypertrophy. Left ventricular diastolic function could not be evaluated. Right Ventricle: The right ventricular size is normal. No increase in right ventricular wall thickness. Right ventricular systolic function is normal. Tricuspid regurgitation signal is inadequate for assessing PA pressure. Left Atrium: Left atrial size was normal in size. Right Atrium: Right atrial size was normal in size. Pericardium: There is no evidence of  pericardial effusion. Mitral Valve: The mitral valve is normal in structure. No evidence of mitral valve regurgitation. No evidence of mitral valve stenosis. Tricuspid Valve: The tricuspid valve is normal in structure. Tricuspid valve regurgitation is not demonstrated. No evidence of tricuspid stenosis. Aortic Valve: The aortic valve is normal in structure. Aortic valve regurgitation is not visualized. No aortic stenosis is present. Pulmonic Valve: The pulmonic valve was normal in structure. Pulmonic valve regurgitation is not visualized. No evidence of pulmonic stenosis. Aorta: The aortic root is normal in size and structure. Venous: The inferior vena cava was not well visualized. IAS/Shunts: No atrial level shunt detected by color flow Doppler.  LEFT VENTRICLE PLAX 2D LVIDd:         3.20 cm LVIDs:         2.30 cm LV PW:         1.30 cm LV IVS:        0.80 cm LVOT diam:     2.10 cm LVOT Area:     3.46 cm  LEFT ATRIUM         Index LA diam:    4.00 cm 1.70 cm/m   AORTA Ao Root diam: 3.30 cm  SHUNTS Systemic Diam: 2.10 cm Kathlyn Sacramento MD Electronically signed by Kathlyn Sacramento MD Signature Date/Time: 08/20/2022/2:23:09 PM    Final    CT Angio Chest PE W and/or Wo Contrast  Result Date: 08/19/2022 CLINICAL DATA:  Abdominal pain and chest pain. Concern for pulmonary embolism and bowel obstruction. Metastatic adenocarcinoma of small-bowel. EXAM: CT ANGIOGRAPHY CHEST CT ABDOMEN AND PELVIS WITH CONTRAST  TECHNIQUE: Multidetector CT imaging of the chest was performed using the standard protocol during bolus administration of intravenous contrast. Multiplanar CT image reconstructions and MIPs were obtained to evaluate the vascular anatomy. Multidetector CT imaging of the abdomen and pelvis was performed using the standard protocol during bolus administration of intravenous contrast. RADIATION DOSE REDUCTION: This exam was performed according to the departmental dose-optimization program which includes automated exposure  control, adjustment of the mA and/or kV according to patient size and/or use of iterative reconstruction technique. CONTRAST:  16m OMNIPAQUE IOHEXOL 350 MG/ML SOLN COMPARISON:  Chest radiograph dated 08/19/2022 and CT dated 08/14/2022. CT abdomen pelvis dated 07/31/2022. FINDINGS: Evaluation of this exam is limited due to respiratory motion artifact. CTA CHEST FINDINGS Cardiovascular: There is no cardiomegaly or pericardial effusion. The thoracic aorta is unremarkable. Evaluation of the pulmonary arteries is limited due to respiratory motion and suboptimal visualization of the peripheral branches. No central pulmonary artery embolus identified. Mediastinum/Nodes: No hilar adenopathy. Mildly rounded subcarinal lymph node measures 8 mm short axis. The esophagus is grossly unremarkable. No mediastinal fluid collection. Right-sided Port-A-Cath with tip at the cavoatrial junction. Lungs/Pleura: Shallow inspiration. There are bibasilar linear and streaky atelectasis. Multiple bilateral pulmonary nodules measuring up to 8 mm in the upper pole right upper lobe in keeping with known metastatic disease. Trace right pleural effusion. No pneumothorax. The central airways are patent. Musculoskeletal: Degenerative changes of the spine. No acute osseous pathology. Review of the MIP images confirms the above findings. CT ABDOMEN and PELVIS FINDINGS No intra-abdominal free air. Small ascites, increased since the prior CT. Hepatobiliary: Multiple (greater than 20) hepatic hypodense metastatic disease similar or progressed since the prior CT. No calcified gallstone. Pancreas: The pancreas is unremarkable as visualized. Spleen: Normal in size without focal abnormality. Adrenals/Urinary Tract: The adrenal glands unremarkable. There is no hydronephrosis on either side. There is a 7 cm cyst in the upper pole of the left kidney. There is symmetric enhancement and excretion of contrast by both kidneys. The visualized ureters and the  urinary bladder is unremarkable. Stomach/Bowel: Ill-defined thickening of the small bowel loop in the mid abdomen as seen on the prior CT in keeping with known malignancy. There is no bowel obstruction. The appendix is normal. Vascular/Lymphatic: Mild aortoiliac atherosclerotic disease. The IVC is unremarkable. No portal venous gas. Periportal adenopathy measure approximately 19 mm in short axis. There is extensive omental nodularity, progressed since the prior CT. An omental mass in the left anterior abdomen measures 8.8 x 3.3 cm. Overall increase in the omental implants since the prior CT. Reproductive: Prostatectomy. Other: Mild subcutaneous edema. Musculoskeletal: Degenerative changes.  No acute osseous pathology. Review of the MIP images confirms the above findings. IMPRESSION: 1. No acute intrathoracic pathology. No central pulmonary artery embolus identified. 2. Multiple bilateral pulmonary nodules in keeping with known metastatic disease and relatively similar to prior CT of 08/14/2022. 3. Progression of metastatic disease in the abdomen and pelvis since the CT of 07/31/2022. There is increased ascites and progression of omental caking. 4. Ill-defined thickening of the small bowel loop in the mid abdomen in keeping with known malignancy. No bowel obstruction. Normal appendix. 5. Hepatic metastatic disease, similar or progressed since the prior CT. 6.  Aortic Atherosclerosis (ICD10-I70.0). Electronically Signed   By: AAnner CreteM.D.   On: 08/19/2022 19:05   CT ABDOMEN PELVIS W CONTRAST  Result Date: 08/19/2022 CLINICAL DATA:  Abdominal pain and chest pain. Concern for pulmonary embolism and bowel obstruction. Metastatic adenocarcinoma of small-bowel. EXAM:  CT ANGIOGRAPHY CHEST CT ABDOMEN AND PELVIS WITH CONTRAST TECHNIQUE: Multidetector CT imaging of the chest was performed using the standard protocol during bolus administration of intravenous contrast. Multiplanar CT image reconstructions and MIPs  were obtained to evaluate the vascular anatomy. Multidetector CT imaging of the abdomen and pelvis was performed using the standard protocol during bolus administration of intravenous contrast. RADIATION DOSE REDUCTION: This exam was performed according to the departmental dose-optimization program which includes automated exposure control, adjustment of the mA and/or kV according to patient size and/or use of iterative reconstruction technique. CONTRAST:  14m OMNIPAQUE IOHEXOL 350 MG/ML SOLN COMPARISON:  Chest radiograph dated 08/19/2022 and CT dated 08/14/2022. CT abdomen pelvis dated 07/31/2022. FINDINGS: Evaluation of this exam is limited due to respiratory motion artifact. CTA CHEST FINDINGS Cardiovascular: There is no cardiomegaly or pericardial effusion. The thoracic aorta is unremarkable. Evaluation of the pulmonary arteries is limited due to respiratory motion and suboptimal visualization of the peripheral branches. No central pulmonary artery embolus identified. Mediastinum/Nodes: No hilar adenopathy. Mildly rounded subcarinal lymph node measures 8 mm short axis. The esophagus is grossly unremarkable. No mediastinal fluid collection. Right-sided Port-A-Cath with tip at the cavoatrial junction. Lungs/Pleura: Shallow inspiration. There are bibasilar linear and streaky atelectasis. Multiple bilateral pulmonary nodules measuring up to 8 mm in the upper pole right upper lobe in keeping with known metastatic disease. Trace right pleural effusion. No pneumothorax. The central airways are patent. Musculoskeletal: Degenerative changes of the spine. No acute osseous pathology. Review of the MIP images confirms the above findings. CT ABDOMEN and PELVIS FINDINGS No intra-abdominal free air. Small ascites, increased since the prior CT. Hepatobiliary: Multiple (greater than 20) hepatic hypodense metastatic disease similar or progressed since the prior CT. No calcified gallstone. Pancreas: The pancreas is unremarkable  as visualized. Spleen: Normal in size without focal abnormality. Adrenals/Urinary Tract: The adrenal glands unremarkable. There is no hydronephrosis on either side. There is a 7 cm cyst in the upper pole of the left kidney. There is symmetric enhancement and excretion of contrast by both kidneys. The visualized ureters and the urinary bladder is unremarkable. Stomach/Bowel: Ill-defined thickening of the small bowel loop in the mid abdomen as seen on the prior CT in keeping with known malignancy. There is no bowel obstruction. The appendix is normal. Vascular/Lymphatic: Mild aortoiliac atherosclerotic disease. The IVC is unremarkable. No portal venous gas. Periportal adenopathy measure approximately 19 mm in short axis. There is extensive omental nodularity, progressed since the prior CT. An omental mass in the left anterior abdomen measures 8.8 x 3.3 cm. Overall increase in the omental implants since the prior CT. Reproductive: Prostatectomy. Other: Mild subcutaneous edema. Musculoskeletal: Degenerative changes.  No acute osseous pathology. Review of the MIP images confirms the above findings. IMPRESSION: 1. No acute intrathoracic pathology. No central pulmonary artery embolus identified. 2. Multiple bilateral pulmonary nodules in keeping with known metastatic disease and relatively similar to prior CT of 08/14/2022. 3. Progression of metastatic disease in the abdomen and pelvis since the CT of 07/31/2022. There is increased ascites and progression of omental caking. 4. Ill-defined thickening of the small bowel loop in the mid abdomen in keeping with known malignancy. No bowel obstruction. Normal appendix. 5. Hepatic metastatic disease, similar or progressed since the prior CT. 6.  Aortic Atherosclerosis (ICD10-I70.0). Electronically Signed   By: AAnner CreteM.D.   On: 08/19/2022 19:05   UKoreaVenous Img Lower Bilateral  Result Date: 08/19/2022 CLINICAL DATA:  BLE edema EXAM: BILATERAL LOWER EXTREMITY VENOUS  DOPPLER ULTRASOUND TECHNIQUE: Gray-scale sonography with compression, as well as color and duplex ultrasound, were performed to evaluate the deep venous system(s) from the level of the common femoral vein through the popliteal and proximal calf veins. COMPARISON:  None Available. FINDINGS: VENOUS Normal compressibility of the common femoral, superficial femoral, and popliteal veins, as well as the visualized calf veins. Visualized portions of profunda femoral vein and great saphenous vein unremarkable. No filling defects to suggest DVT on grayscale or color Doppler imaging. Doppler waveforms show normal direction of venous flow, normal respiratory plasticity and response to augmentation. OTHER None. Limitations: none IMPRESSION: Negative. Electronically Signed   By: Valentino Saxon M.D.   On: 08/19/2022 17:49   DG Chest 2 View  Result Date: 08/19/2022 CLINICAL DATA:  History of liver cancer.  Bilateral leg swelling EXAM: CHEST - 2 VIEW COMPARISON:  CT of the chest August 14, 2022 FINDINGS: Bibasilar atelectasis remains. A right Port-A-Cath is in good position. No pneumothorax. The cardiomediastinal silhouette is stable. No other suspicious findings. IMPRESSION: Bibasilar atelectasis. No other acute abnormalities. Electronically Signed   By: Dorise Bullion III M.D.   On: 08/19/2022 16:23   CT Chest W Contrast  Result Date: 08/15/2022 CLINICAL DATA:  Staging exam. 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 * Tracking Code: BO * EXAM: CT CHEST WITH CONTRAST TECHNIQUE: Multidetector CT imaging of the chest was performed during intravenous contrast administration. RADIATION DOSE REDUCTION: This exam was performed according to the departmental dose-optimization program which includes automated exposure control, adjustment of the mA and/or kV according to patient size and/or use of iterative reconstruction technique. CONTRAST:  40m OMNIPAQUE IOHEXOL 300 MG/ML  SOLN COMPARISON:  None Available. FINDINGS:  Cardiovascular: The heart size is normal. No substantial pericardial effusion. Coronary artery calcification is evident. No thoracic aortic aneurysm. Right Port-A-Cath tip is positioned at the SVC/RA junction. Enlargement of the pulmonary outflow tract/main pulmonary arteries suggests pulmonary arterial hypertension. Mediastinum/Nodes: Mild lymphadenopathy noted around the inferior descending thoracic aorta. There is no hilar lymphadenopathy. The esophagus has normal imaging features. There is no axillary lymphadenopathy. Left-sided retrocrural lymphadenopathy evident. Lungs/Pleura: Numerous bilateral pulmonary nodules identified involving all lobes of both lungs. Index right upper lobe pulmonary nodule on image 52/4 measures 8 mm. Index nodule in the left upper lobe measures 8 mm on image 34/4. Left lower lobe index nodule measures 8 mm on image 91/4. There is bilateral lower lobe atelectasis, right greater than left. No dense focal airspace consolidation. No pleural effusion. Upper Abdomen: Multiple hepatic metastases again noted as characterized on recent abdomen/pelvis CT. Index lesion today in the anterior hepatic dome measuring 2.9 cm on image 101/2 was 2.4 cm previously (remeasured). A second lesion in the medial right liver measuring 3.1 cm on image 115/2 today was 2.7 cm previously (remeasured). Perihepatic ascites is progressive in the interval with new Peri splenic ascites evident on today's exam. Musculoskeletal: No worrisome lytic or sclerotic osseous abnormality. IMPRESSION: 1. Numerous bilateral pulmonary nodules involving all lobes of both lungs measure up to 8 mm in size, consistent with metastatic disease. 2. Mild lymphadenopathy around the inferior descending thoracic aorta and in the left retrocrural space, consistent with metastatic disease. 3. Multiple hepatic metastases as characterized on recent abdomen/pelvis CT. Lesions visible in the upper liver today appear minimally progressive in the 2  week interval since the prior abdomen/pelvis CT 4. Interval progression of perihepatic ascites with new perisplenic ascites evident on today's exam. 5. Enlargement of the pulmonary outflow tract/main pulmonary arteries  suggests pulmonary arterial hypertension. Electronically Signed   By: Misty Stanley M.D.   On: 08/15/2022 07:05   IR IMAGING GUIDED PORT INSERTION  Result Date: 08/13/2022 INDICATION: port placement for chemotherapy EXAM: Chest port placement using ultrasound and fluoroscopic guidance MEDICATIONS: Documented in the EMR ANESTHESIA/SEDATION: Moderate (conscious) sedation was employed during this procedure. A total of Versed 2 mg and Fentanyl 100 mcg was administered intravenously. Moderate Sedation Time: 30 minutes. The patient's level of consciousness and vital signs were monitored continuously by radiology nursing throughout the procedure under my direct supervision. FLUOROSCOPY TIME:  Fluoroscopy Time: 0.9 minutes (8 mGy) COMPLICATIONS: None immediate. PROCEDURE: Informed written consent was obtained from the patient after a thorough discussion of the procedural risks, benefits and alternatives. All questions were addressed. Maximal Sterile Barrier Technique was utilized including caps, mask, sterile gowns, sterile gloves, sterile drape, hand hygiene and skin antiseptic. A timeout was performed prior to the initiation of the procedure. The patient was placed supine on the exam table. The right neck and chest was prepped and draped in the standard sterile fashion. A preliminary ultrasound of the right neck was performed and demonstrates a patent right internal jugular vein. A permanent ultrasound image was stored in the electronic medical record. The overlying skin was anesthetized with 1% Lidocaine. Using ultrasound guidance, access was obtained into the right internal jugular vein using a 21 gauge micropuncture set. A wire was advanced into the SVC, a short incision was made at the puncture  site, and serial dilatation performed. Next, in an ipsilateral infraclavicular location, an incision was made at the site of the subcutaneous reservoir. Blunt dissection was used to open a pocket to contain the reservoir. A subcutaneous tunnel was then created from the port site to the puncture site. A(n) 8 Fr single lumen catheter was advanced through the tunnel. The catheter was attached to the port and this was placed in the subcutaneous pocket. Under fluoroscopic guidance, a peel away sheath was placed, and the catheter was trimmed to the appropriate length and was advanced into the central veins. The catheter length is 23 cm. The tip of the catheter lies near the superior cavoatrial junction. The port flushes and aspirates appropriately. The port was flushed and locked with heparinized saline. The port pocket was closed in 2 layers using 3-0 and 4-0 Vicryl/absorbable suture. Dermabond was also applied to both incisions. The patient tolerated the procedure well and was transferred to recovery in stable condition. IMPRESSION: Successful placement of a right-sided chest port via the right internal jugular vein. The port is ready for immediate use. Electronically Signed   By: Albin Felling M.D.   On: 08/13/2022 16:14   US BIOPSY (LIVER)  Result Date: 08/06/2022 INDICATION: liver mass EXAM: ULTRASOUND GUIDED LIVER MASS BIOPSY COMPARISON:  CT AP, 07/31/2022. MEDICATIONS: None ANESTHESIA/SEDATION: Moderate (conscious) sedation was employed during this procedure. A total of Versed 1 mg and Fentanyl 50 mcg was administered intravenously. Moderate Sedation Time: 10 minutes. The patient's level of consciousness and vital signs were monitored continuously by radiology nursing throughout the procedure under my direct supervision. COMPLICATIONS: None immediate. PROCEDURE: Informed written consent was obtained from the patient and/or patient's representative after a discussion of the risks, benefits and alternatives to  treatment. The patient understands and consents the procedure. A timeout was performed prior to the initiation of the procedure. Ultrasound scanning was performed of the right upper abdominal quadrant demonstrates multifocal liver masses A LEFT hepatic lobe mass was selected for biopsy and the  procedure was planned. The right upper abdominal quadrant was prepped and draped in the usual sterile fashion. The overlying soft tissues were anesthetized with 1% lidocaine with epinephrine. A 17 gauge, 6.8 cm co-axial needle was advanced into a peripheral aspect of the lesion. This was followed by 3 core biopsies with an 18 gauge core device under direct ultrasound guidance. The coaxial needle tract was embolized with a small amount of Gel-Foam slurry and superficial hemostasis was obtained with manual compression. Post procedural scanning was negative for definitive area of hemorrhage or additional complication. A dressing was placed. The patient tolerated the procedure well without immediate post procedural complication. IMPRESSION: Successful ultrasound guided core needle biopsy of liver mass. Michaelle Birks, MD [facility] Electronically Signed   By: Michaelle Birks M.D.   On: 08/06/2022 14:53   CT ABDOMEN PELVIS W CONTRAST  Result Date: 07/31/2022 CLINICAL DATA:  30 pound weight loss. Increased calcium level. Increased liver enzymes. Elevated white blood cell count. Prostatectomy 4 years ago. * Tracking Code: BO * EXAM: CT ABDOMEN AND PELVIS WITH CONTRAST TECHNIQUE: Multidetector CT imaging of the abdomen and pelvis was performed using the standard protocol following bolus administration of intravenous contrast. RADIATION DOSE REDUCTION: This exam was performed according to the departmental dose-optimization program which includes automated exposure control, adjustment of the mA and/or kV according to patient size and/or use of iterative reconstruction technique.  CONTRAST:  157m OMNIPAQUE IOHEXOL 300 MG/ML  SOLN COMPARISON:  05/29/2022 chest radiograph.  Prostate MRI 03/06/2019. FINDINGS: Lower chest: Bibasilar pulmonary nodules. Example right middle lobe 5 mm nodule on 08/04. Normal heart size without pericardial or pleural effusion. Areas of left-sided pleural-based nodularity versus subpleural lymph nodes. Example 8 mm on 06/02. Hepatobiliary: Innumerable, bilateral liver masses consistent with metastasis. Example posterior right hepatic lobe (segment 7) 3.7 x 3.0 cm on 21/2. Central anterior segment 2 mass measures 3.4 x 3.2 cm on 20/2. No calcified gallstone or biliary duct dilatation. Pancreas: Normal, without mass or ductal dilatation. Spleen: Normal in size, without focal abnormality. Adrenals/Urinary Tract: Normal right adrenal gland. Mild left adrenal thickening and nodularity, including at up to 1.0 cm on 25/2. Interpolar left renal 5.7 cm fluid density lesion is likely a cyst . In the absence of clinically indicated signs/symptoms require(s) no independent follow-up. Normal right kidney. No hydronephrosis. Normal urinary bladder. Stomach/Bowel: Proximal gastric underdistention. Scattered colonic diverticula. Normal terminal ileum and appendix. A loop of markedly thickened jejunum is identified on 57/2 and coronal image 57. Vascular/Lymphatic: Aortic atherosclerosis. Porta hepatis adenopathy including a portacaval 1.6 cm node on 36/2. Small bowel mesenteric adenopathy, centered about the thickened small bowel loop. Example nodes at up to 2.6 x 2.4 cm on 55/2. No pelvic sidewall adenopathy. Reproductive: Prostatectomy, without local recurrence. Other: Small volume abdominopelvic ascites. Extensive peritoneal metastasis. Example left omental implant of 4.2 x 4.5 cm on 47/2. No free intraperitoneal air. Tiny fat containing right inguinal hernia. Small fat containing paraumbilical hernia. Mild right-sided gynecomastia. Musculoskeletal: Presumably degenerative partial  fusion of the bilateral sacroiliac joints. Lumbosacral spondylosis. IMPRESSION: 1. Widespread metastatic disease, including to liver, abdominal nodes, peritoneum, and likely the left pleural space. 2. 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 3. Small bibasilar pulmonary nodules, nonspecific but mildly suspicious for metastatic disease. 4. Small volume abdominopelvic ascites. 5. Prostatectomy, without local recurrence. Distribution of metastatic disease felt unlikely to represent prostate. 6. Indeterminate left adrenal nodule. 7.  Aortic Atherosclerosis (ICD10-I70.0). Electronically Signed   By: Abigail Miyamoto M.D.   On: 07/31/2022 11:38    PERFORMANCE STATUS (ECOG) : 3 - Symptomatic, >50% confined to bed  Review of Systems Unable to complete  Physical Exam General: Frail appearing Pulmonary: Unlabored Extremities: no edema, no joint deformities Skin: no rashes Neurological: Wakes when stimulated  IMPRESSION: Patient currently sleeping.  He wakes when stimulated but fell back asleep during conversation.  Met instead with his wife to discuss goals.  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  Both he and family are in agreement with initiation of chemotherapy and recognizes that this will likely be delayed until his performance status improves.  Wife confirms plan is likely rehab.  Will plan to follow patient in the clinic following discharge from the hospital.  Symptomatically, wife says the patient is doing better today.  He is eating more and has been out of bed to the chair twice.  Wife is interested in completing ACP documents and has those at home to review with patient.  She says the patient has verbalized a desire to remain a full code for now but would be willing to reconsider in the event of decline or futility.  PLAN: -Continue current scope of treatment -Dispo: Probable rehab with  palliative care following -Will follow in the clinic  Case and plan discussed with Dr. Grayland Ormond.   Time Total: 30 minutes  Visit consisted of counseling and education dealing with the complex and emotionally intense issues of symptom management and palliative care in the setting of serious and potentially life-threatening illness.Greater than 50%  of this time was spent counseling and coordinating care related to the above assessment and plan.  Signed by: Altha Harm, PhD, NP-C

## 2022-08-21 NOTE — NC FL2 (Addendum)
[place] LEVEL OF CARE FORM     IDENTIFICATION  Patient Name: Philip Arnold. Birthdate: 05-19-1961 Sex: male Admission Date (Current Location): 08/19/2022  [place] and [place] Number:  Engineering geologist and Address:  [facility], 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   Metastatic adenocarcinoma ([place]) 08/20/2022   Hyperkalemia 08/19/2022   Hyponatremia 08/19/2022   Leg swelling 08/19/2022   Fluid overload 08/19/2022   Anasarca 08/19/2022   AKI (acute kidney injury) ([place]) 08/19/2022   Thrombocytosis 08/19/2022   Hypermagnesemia 08/19/2022   TSH elevation 08/19/2022   Hypercalcemia 07/31/2022   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[place]) 07/31/2022   HLD (hyperlipidemia) 07/31/2022   Abnormal LFTs 07/31/2022   Leukocytosis 07/31/2022   Microcytic anemia 07/31/2022   Prostate cancer ([place]) 04/24/2019   Hip pain 04/29/2018   Hyperglycemia 04/29/2018   Hypertension 04/29/2018   Obesity (BMI 30-39.9) 04/29/2018   Pure hypercholesterolemia 04/29/2018   Elevated PSA 12/05/2016    Orientation RESPIRATION BLADDER Height & Weight     Self, Time, Situation, Place  O2 (3l) Incontinent, External catheter Weight: 257 lb 15 oz (117 kg) Height:  '5\' 11"'$  (180.3 cm)  BEHAVIORAL SYMPTOMS/MOOD NEUROLOGICAL BOWEL NUTRITION STATUS      Continent Diet (renal with fluid restriction)  AMBULATORY STATUS  COMMUNICATION OF NEEDS Skin   Extensive Assist Verbally Normal                       Personal Care Assistance Level of Assistance  Bathing, Feeding, Dressing Bathing Assistance: Maximum assistance Feeding assistance: Limited assistance Dressing Assistance: Limited assistance     Functional Limitations Info  Sight, Hearing, Speech Sight Info: Impared Hearing Info: Adequate Speech Info: Adequate    SPECIAL CARE FACTORS FREQUENCY  PT (By licensed PT), OT (By licensed OT)     PT Frequency: 5 times a week OT Frequency: 5 times a week            Contractures Contractures Info: Not present    Additional Factors Info  Code Status Code Status Info: full             Current Medications (08/21/2022):  This is the current hospital active medication list Current Facility-Administered Medications  Medication Dose Route Frequency Provider Last Rate Last Admin   0.9 %  sodium chloride infusion   Intravenous Continuous Richarda Osmond, MD 75 mL/hr at 08/21/22 0859 New Bag at 08/21/22 0859   albuterol (PROVENTIL) (2.5 MG/3ML) 0.083% nebulizer solution 2.5 mg  2.5 mg Inhalation Q4H PRN Gertie Fey, MD   2.5 mg at 08/21/22 0804   aspirin EC tablet 81 mg  81 mg Oral Daily Gertie Fey, MD   81 mg at 08/21/22 0859   cholecalciferol (VITAMIN D3) 25 MCG (1000 UNIT) tablet 5,000 Units  5,000 Units Oral Daily Gertie Fey, MD   5,000 Units at 08/21/22 0859   heparin injection 5,000 Units  5,000  Units Subcutaneous Q8H Lorin Picket, [place]   5,000 Units at 08/21/22 1410   lactulose (CHRONULAC) 10 GM/15ML solution 10 g  10 g Oral BID Richarda Osmond, MD   10 g at 08/21/22 0859   metoprolol succinate (TOPROL-XL) 24 hr tablet 100 mg  100 mg Oral Daily Gertie Fey, MD   100 mg at 08/21/22 [id number]   oxyCODONE (Oxy IR/ROXICODONE) immediate release tablet 5 mg  5 mg Oral Q4H PRN Gertie Fey, MD       pantoprazole (PROTONIX) EC tablet 40 mg  40 mg Oral Daily Gertie Fey, MD   40 mg at  08/21/22 0859   prochlorperazine (COMPAZINE) tablet 10 mg  10 mg Oral Q6H PRN Gertie Fey, MD       simvastatin (ZOCOR) tablet 20 mg  20 mg Oral QHS Gertie Fey, MD   20 mg at 08/20/22 2159   sodium chloride flush (NS) 0.9 % injection 3 mL  3 mL Intravenous Q12H Gertie Fey, MD   3 mL at 08/21/22 0900     Discharge Medications: Please see discharge summary for a list of discharge medications.  Relevant Imaging Results:  Relevant Lab Results:   Additional Information SS# [id number]  Gerilyn Pilgrim, LCSW

## 2022-08-22 ENCOUNTER — Inpatient Hospital Stay: Payer: BC Managed Care – PPO

## 2022-08-22 DIAGNOSIS — M7989 Other specified soft tissue disorders: Secondary | ICD-10-CM

## 2022-08-22 DIAGNOSIS — E871 Hypo-osmolality and hyponatremia: Secondary | ICD-10-CM | POA: Diagnosis not present

## 2022-08-22 DIAGNOSIS — C799 Secondary malignant neoplasm of unspecified site: Secondary | ICD-10-CM | POA: Diagnosis not present

## 2022-08-22 DIAGNOSIS — C787 Secondary malignant neoplasm of liver and intrahepatic bile duct: Secondary | ICD-10-CM

## 2022-08-22 LAB — CBC
HCT: 29.9 % — ABNORMAL LOW (ref 39.0–52.0)
Hemoglobin: 9.4 g/dL — ABNORMAL LOW (ref 13.0–17.0)
MCH: 23.8 pg — ABNORMAL LOW (ref 26.0–34.0)
MCHC: 31.4 g/dL (ref 30.0–36.0)
MCV: 75.7 fL — ABNORMAL LOW (ref 80.0–100.0)
Platelets: 711 10*3/uL — ABNORMAL HIGH (ref 150–400)
RBC: 3.95 MIL/uL — ABNORMAL LOW (ref 4.22–5.81)
RDW: 24.6 % — ABNORMAL HIGH (ref 11.5–15.5)
WBC: 19.2 10*3/uL — ABNORMAL HIGH (ref 4.0–10.5)
nRBC: 0 % (ref 0.0–0.2)

## 2022-08-22 LAB — COMPREHENSIVE METABOLIC PANEL
ALT: 70 U/L — ABNORMAL HIGH (ref 0–44)
AST: 151 U/L — ABNORMAL HIGH (ref 15–41)
Albumin: 1.8 g/dL — ABNORMAL LOW (ref 3.5–5.0)
Alkaline Phosphatase: 338 U/L — ABNORMAL HIGH (ref 38–126)
Anion gap: 8 (ref 5–15)
BUN: 49 mg/dL — ABNORMAL HIGH (ref 8–23)
CO2: 21 mmol/L — ABNORMAL LOW (ref 22–32)
Calcium: 9.8 mg/dL (ref 8.9–10.3)
Chloride: 99 mmol/L (ref 98–111)
Creatinine, Ser: 1.22 mg/dL (ref 0.61–1.24)
GFR, Estimated: >60 mL/min (ref >60)
Glucose, Bld: 100 mg/dL — ABNORMAL HIGH (ref 70–99)
Potassium: 5.1 mmol/L (ref 3.5–5.1)
Sodium: 128 mmol/L — ABNORMAL LOW (ref 135–145)
Total Bilirubin: 5.9 mg/dL — ABNORMAL HIGH (ref 0.3–1.2)
Total Protein: 5.9 g/dL — ABNORMAL LOW (ref 6.5–8.1)

## 2022-08-22 LAB — SODIUM, URINE, RANDOM: Sodium, Ur: <10 mmol/L

## 2022-08-22 LAB — SODIUM
Sodium: 124 mmol/L — ABNORMAL LOW (ref 135–145)
Sodium: 125 mmol/L — ABNORMAL LOW (ref 135–145)

## 2022-08-22 LAB — OSMOLALITY, URINE: Osmolality, Ur: 556 mOsm/kg (ref 300–900)

## 2022-08-22 MED ORDER — SODIUM CHLORIDE 0.9 % IV SOLN
12.5000 mg | Freq: Four times a day (QID) | INTRAVENOUS | Status: DC | PRN
  Administered 2022-08-22 – 2022-08-24 (×2): 12.5 mg via INTRAVENOUS
  Filled 2022-08-22 (×3): qty 0.5

## 2022-08-22 NOTE — Progress Notes (Signed)
Occupational Therapy Treatment Patient Details Name: Philip Arnold. MRN: [id number] DOB: [birth year] Today's Date: 08/22/2022   History of present illness Pt is a 62 y.o. male with PMH significant for metastatic carcinoma of small bowel, HTN anemia, prostate cancer, obesity, HLD, and hyperglycemia who presented to the ED with generalized weakness, poor appetite as well as development of lower extremity swelling and abdominal distention. 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   OT comments  Patient received sitting on BSC with RN present, both agreeable to OT. Pt with continent void on BSC. He required Min A to stand from [facility] and Max A for posterior hygiene. Pt then completed functional mobility back to EOB with Min guard using RW. He required Min A to return to supine and was repositioned with pillows in order to alleviate low back pain. Pt left semi-reclined in bed with all needs in reach. Pt is making progress toward goal completion. D/C recommendation remains appropriate. OT will continue to follow acutely.    Recommendations for follow up therapy are one component of a multi-disciplinary discharge planning process, led by the attending physician.  Recommendations may be updated based on patient status, additional functional criteria and insurance authorization.    Follow Up Recommendations  Skilled nursing-short term rehab (<3 hours/day)     Assistance Recommended at Discharge Frequent or constant Supervision/Assistance  Patient can return home with the following  A lot of help with bathing/dressing/bathroom;Assistance with cooking/housework;Assist for transportation;Help with stairs or ramp for entrance;A little help with walking and/or transfers   Equipment  Recommendations  Other (comment) (defer to next venue of care)    Recommendations for Other Services      Precautions / Restrictions Precautions Precautions: Fall Restrictions Weight Bearing Restrictions: No       Mobility Bed Mobility Overal bed mobility: Needs Assistance Bed Mobility: Sit to Supine       Sit to supine: Min assist        Transfers Overall transfer level: Needs assistance Equipment used: Rolling walker (2 wheels) Transfers: Sit to/from Stand Sit to Stand: Min assist                 Balance Overall balance assessment: Needs assistance Sitting-balance support: Feet supported Sitting balance-[name]: Good     Standing balance support: Bilateral upper extremity supported, During functional activity, Reliant on assistive device for balance Standing balance-[name]: Fair                             ADL either performed or assessed with clinical judgement   ADL Overall ADL's : Needs assistance/impaired                         Toilet Transfer: Minimal assistance;BSC/3in1;Rolling walker (2 wheels);Ambulation Toilet Transfer Details (indicate cue type and reason): Min A to stand from [place] and Hygiene: Maximal assistance;Sit to/from stand Toileting - Clothing Manipulation Details (indicate cue type and reason): for posterior hygiene after continent BM on BSC     Functional mobility during ADLs: Min guard;Rolling walker (2 wheels) (to take several steps from BSC>EOB)      Extremity/Trunk Assessment Upper Extremity Assessment Upper Extremity Assessment: Generalized weakness   Lower Extremity Assessment Lower Extremity Assessment: Generalized weakness        Vision  Baseline Vision/History: 1 Wears glasses Patient Visual Report: No change from baseline     Perception     Praxis      Cognition Arousal/Alertness: Awake/alert Behavior During Therapy: WFL for tasks  assessed/performed Overall Cognitive Status: No family/caregiver present to determine baseline cognitive functioning Area of Impairment: Following commands                       Following Commands: Follows multi-step commands with increased time       General Comments: increased time for processing        Exercises      Shoulder Instructions       General Comments      Pertinent Vitals/ Pain       Pain Assessment Pain Assessment: Faces Faces Pain Scale: Hurts a little bit Pain Location: low back Pain Descriptors / Indicators: Sore Pain Intervention(s): Limited activity within patient's tolerance, Monitored during session, Repositioned (repositioned in bed with pillows)  Home Living          Prior Functioning/Environment              Frequency  Min 2X/week        Progress Toward Goals  OT Goals(current goals can now be found in the care plan section)  Progress towards OT goals: Progressing toward goals  Acute Rehab OT Goals Patient Stated Goal: get stronger, return to PLOF OT Goal Formulation: With patient Time For Goal Achievement: 09/03/22 Potential to Achieve Goals: Good  Plan Discharge plan remains appropriate;Frequency remains appropriate    Co-evaluation                 AM-PAC OT "6 Clicks" Daily Activity     Outcome Measure   Help from another person eating meals?: None Help from another person taking care of personal grooming?: A Little Help from another person toileting, which includes using toliet, bedpan, or urinal?: A Lot Help from another person bathing (including washing, rinsing, drying)?: A Lot Help from another person to put on and taking off regular upper body clothing?: A Little Help from another person to put on and taking off regular lower body clothing?: A Lot 6 Click Score: 16    End of Session Equipment Utilized During Treatment: Rolling walker (2 wheels);Gait belt  OT Visit Diagnosis: Unsteadiness on  feet (R26.81);History of falling (Z91.81);Muscle weakness (generalized) (M62.81)   Activity Tolerance Patient tolerated treatment well;Patient limited by fatigue   Patient Left in bed;with call bell/phone within reach;with bed alarm set   Nurse Communication Mobility status        Time: 1019-1040 OT Time Calculation (min): 21 min  Charges: OT General Charges $OT Visit: 1 Visit OT Treatments $Self Care/Home Management : 8-22 mins  [facility] MS, OTR/L ascom [phone]  08/22/22, 1:52 PM

## 2022-08-22 NOTE — Progress Notes (Addendum)
PROGRESS NOTE  Philip Arnold.    DOB: Nov 06, 1960, 62 y.o.  [id number]    Code Status: Full Code   DOA: 08/19/2022   LOS: 3   Brief hospital course  Philip Arnold. is a 62 y.o. male with a PMH significant for metastatic carcinoma of small bowel, HTN anemia, prostate cancer, obesity, HLD, .  They presented from home to the ED on 08/19/2022 with generalized weakness and worsened swelling x 4 days. Also endorses poor appetite.  Had liver biopsy 08/06/22 which showed metastatic adenocarcinoma. He was due to start therapy outpatient at cancer center 3/4. Dr. Grayland Ormond aware of admission.   In the ED, it was found that they had stable vital signs ORA. He did later develop some mild hypoxia and required 3L [place].  Significant findings included WBC 19.9, hgb 10.7, platelets 811, PT/INR 16.8/1.4troponin neg, Na+ 124, K+ 5.6, Cl- 91, glucose 122, Cr 1.31, albumin 2.1, AST/ALT 138/64, alk phos 372, total bili 5.7, BNP 41.5, lipase 64, Mg+ 2.8. Blood cultures were collected. Chest xray: bibasilar atelectasis LE doppler: negative DVT CTA chest: showed known metastatic nodules without PE CT abdomen pelvis significant findings: Progression of metastatic disease in the abdomen and pelvis since the CT of 07/31/2022. There is increased ascites and progression of omental caking.  Ill-defined thickening of the small bowel loop in the mid abdomen in keeping with known malignancy. No bowel obstruction.  Hepatic metastatic disease, similar or progressed since the prior CT.  They were initially treated with CTX, flagyl. Heme/onc was made aware of admission to consult.    Patient was admitted to medicine service for further workup and management of generalized weakness as outlined in detail below.  3/4: conservative management with fluid restriction and observation. Avoiding IV fluid due to overall hypervolemic status if possible.  08/22/22 -stable. Starting gentle IV fluids for worsening hyponatremia.   UPDATE: Na+ is improving on NS at appropriate rate so will continue NS for now with frequent electrolyte monitoring. 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 Suspicion for possible hepatic encephalopathy. Ammonia was mildly elevated, improved.  - continue lactulose - registered dietician consult - electrolyte management  - avoid sedating medications - PT/OT- recommending SNF at dc. Dispo plan pending family discussion with palliative for GOC  Anasarca- given abdominal size and increased interval pleural fluid on imaging, I was expecting to order paracentesis but patient has no tenderness to palpation on exam so continue to hold off. 1+ pitting edema bilateral LE. From liver disease/leaky gut. Echo is unremarkable.  With fluid restriction alone, patient had worsening of hyponatremia so will attempt gentle IVF today to address since his echo was normal and abdomen is non-tender. Will need to watch fluid status carefully and  - continue fluid restrict - diuresis PRN, lasix can worsen hyponatremia - strict I/O - daily weights  Acute hypoxic respiratory failure- resolved. currently on room air. due to volume overload although no overt congestion on xray and lungs clear on exam. May be also contributed by hiccups.  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  Plan was to start outpatient treatment 3/4 but was admitted for progressive  generalized weakness. - heme/onc following, appreciate care - palliative care consulted - follow liver labs which have mild improvement today - supportive care PRN - heparin DVT ppx for hypercoagulable state  Hyponatremia- chronic. Moderate and possible mild symptomatic contributing to his overall weakness.  Na level 3 months ago was mildly low at 131. Na+ 125>127>122 (3/5 - started gentle fluids) > 128. Initially just fluid restricted to avoid fluid overload but started gentle NS fluids yesterday (now dyspneic) - Goal to improve 4-28mol per 24 hour period. May have to change to hypertonic solution if not improving. - check urine osm & sodium - q6hr Na+ checks - hold HCTZ - fluid restrict - frequent neuro checks - daily weights to assess volume status   Hyperkalemia- K+ 5.4>5.0>5.3>5.1. Hemodynamically stable - treated with albuterol, lokelma.  - monitor  Hypercalcemia- resolved - f/u PTH, ionized calcium   Leukocytosis- acute reaction. no overt signs of infection.  - monitor blood cultures, NGTD - not continuing Abx at this time   TSH elevation- free T4 mildly elevated 1.19. PCP repeat TSH/free T4 in several weeks after acute issues have resolved   Hypermagnesemia- improving. Mg++ 2.8>2.5 Due to AKI Monitor BMP Telemetry   Thrombocytosis - suspect reactive Heme-onc evaluation Follow CBC  AKI- resolved  Body mass index is 35.98 kg/m.  VTE ppx: heparin injection 5,000 Units Start: 08/20/22 2200 SCDs Start: 08/19/22 2224  Diet:     Diet   Diet renal with fluid restriction Fluid restriction: 1200 mL Fluid; Room service appropriate? Yes; Fluid consistency: Thin   Consultants: Heme/onc  Subjective 08/22/22    Pt awake resting in bed.  He was short of breath earlier, given breathing treatment with only a little improvement.  Overall he reports feeling much better than when admitted initially.  Hopes to go home soon.    Objective   Vitals:   08/21/22 1617  08/21/22 2124 08/22/22 0739 08/22/22 1138  BP: 111/69 137/79 110/68 90/63  Pulse: 84 88 88 89  Resp: '16 19 17 20  '$ Temp: 98.1 F (36.7 C) 98.9 F (37.2 C) 97.7 F (36.5 C) 98.2 F (36.8 C)  TempSrc: Oral Oral    SpO2: 97% 94% 98% 98%  Weight:      Height:        Intake/Output Summary (Last 24 hours) at 08/22/2022 1305 Last data filed at 08/22/2022 0850 Gross per 24 hour  Intake 1270.35 ml  Output 1300 ml  Net -29.65 ml   Filed Weights   08/19/22 1552 08/19/22 2144 08/21/22 1400  Weight: 112.5 kg 116.8 kg 117 kg     Physical Exam:  General: awake, alert, NAD, appears fatigued HEENT: anicteric sclera, MMM, hearing grossly normal Respiratory: normal respiratory effort. CTAB Cardiovascular: quick capillary refill, normal S1/S2, RRR Gastrointestinal: obese, non-tender to palpation. Mildly distended Nervous: A&O x3. no gross focal neurologic deficits, normal speech Extremities: moves all equally, 1+ pitting lower extremity edema, normal tone Skin: dry, intact, normal temperature, normal color. No rashes, lesions or ulcers on exposed skin Psychiatry: normal mood, flat affect  Labs   I have personally reviewed the following labs and imaging studies CBC    Component Value Date/Time   WBC 19.2 (H) 08/22/2022 0453   RBC 3.95 (L) 08/22/2022 0453   HGB 9.4 (L) 08/22/2022 0453  HCT 29.9 (L) 08/22/2022 0453   PLT 711 (H) 08/22/2022 0453   MCV 75.7 (L) 08/22/2022 0453   MCH 23.8 (L) 08/22/2022 0453   MCHC 31.4 08/22/2022 0453   RDW 24.6 (H) 08/22/2022 0453   LYMPHSABS 1.5 07/31/2022 0900   MONOABS 1.6 (H) 07/31/2022 0900   EOSABS 0.1 07/31/2022 0900   BASOSABS 0.1 07/31/2022 0900      Latest Ref Rng & Units 08/22/2022   11:31 AM 08/22/2022    4:53 AM 08/21/2022   11:31 PM  BMP  Glucose 70 - 99 mg/dL  100    BUN 8 - 23 mg/dL  49    Creatinine 0.61 - 1.24 mg/dL  1.22    Sodium 135 - 145 mmol/L 124  128  125   Potassium 3.5 - 5.1 mmol/L  5.1    Chloride 98 - 111 mmol/L  99     CO2 22 - 32 mmol/L  21    Calcium 8.9 - 10.3 mg/dL  9.8      No results found.  Disposition Plan & Communication   Patient status: Inpatient  Admitted From: Home Planned disposition location: Home vs SNF (recommended)  Anticipated discharge date: 3/7-8 pending improvement and stability of electrolytes  Family Communication: wife at bedside on rounds   Author: Ezekiel Slocumb, DO [facility] 08/22/2022, 1:05 PM   Available by Epic secure chat 7AM-7PM. If 7PM-7AM, please contact night-coverage.  [facility] contact information found on [email or web address].

## 2022-08-22 NOTE — Progress Notes (Signed)
Physical Therapy Treatment Patient Details Name: Philip Arnold. MRN: [id number] DOB: Mar 22, 1961 Today's Date: 08/22/2022   History of Present Illness Pt is a 62 y.o. male with PMH significant for metastatic carcinoma of small bowel, HTN anemia, prostate cancer, obesity, HLD, and hyperglycemia who presented to the ED with generalized weakness, poor appetite as well as development of lower extremity swelling and abdominal distention. 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    PT Comments    Pt was long sitting in bed with supportive spouse present. He continues to present with slow processing and slow response time however is oriented and able to appropriately answer all questions during session. He requires increased time to perform all desired task. Does not endorse pain today however did c/o increased fatigue from previous session. Pt tolerated getting OOB towards L, standing to RW, and ambulating to doorway of room and return. Distance limited by fatigue. Pt was sitting in recliner post session performing LE there ex at conclusion of session. SNF remains most appropriate DC disposition to maximize pt's activity tolerance, strength, and safety during all ADLs.    Recommendations for follow up therapy are one component of a multi-disciplinary discharge planning process, led by the attending physician.  Recommendations may be updated based on patient status, additional functional criteria and insurance authorization.  Follow Up Recommendations  Skilled nursing-short term rehab (<3 hours/day)     Assistance Recommended at Discharge Frequent or constant Supervision/Assistance  Patient can return home with the following A little help with bathing/dressing/bathroom;Assistance with  cooking/housework;Direct supervision/assist for medications management;Assist for transportation;Help with stairs or ramp for entrance;A little help with walking and/or transfers   Equipment Recommendations  Other (comment) (Defer to next level of care)       Precautions / Restrictions Precautions Precautions: Fall Restrictions Weight Bearing Restrictions: No     Mobility  Bed Mobility Overal bed mobility: Needs Assistance Bed Mobility: Supine to Sit  Supine to sit: Min assist, Mod assist (increased time + HHA)  General bed mobility comments: slow moving but was able to achieve EOB short sit with min-mod assist    Transfers Overall transfer level: Needs assistance Equipment used: Rolling walker (2 wheels) Transfers: Sit to/from Stand Sit to Stand: Min guard, Min assist    General transfer comment: pt was able to stand with CGA from EOB that was slightly elevated, min assist from lowest bed height    Ambulation/Gait Ambulation/Gait assistance: Min guard Gait Distance (Feet): 20 Feet Assistive device: Rolling walker (2 wheels) Gait Pattern/deviations: Step-through pattern, Decreased step length - right, Decreased step length - left, Trunk flexed    General Gait Details: pt more fatigued today and was unable to ambulate as far as previous date. slow cautious gait however no LOB. Pt is mostly limited by his poor activity tolerance    Balance Overall balance assessment: Needs assistance Sitting-balance support: Feet supported Sitting balance-[name]: Good     Standing balance support: Bilateral upper extremity supported, During functional activity, Reliant on assistive device for balance Standing balance-[name]: Fair         Cognition Arousal/Alertness: Awake/alert Behavior During Therapy: WFL for tasks assessed/performed Overall Cognitive Status: Within Functional Limits for tasks assessed (slow response time and slow processing) Area of Impairment: Following  commands        General Comments: increased time for processing  Pertinent Vitals/Pain Pain Assessment Pain Assessment: No/denies pain Pain Intervention(s): Limited activity within patient's tolerance, Monitored during session     PT Goals (current goals can now be found in the care plan section) Acute Rehab PT Goals Patient Stated Goal: To get stronger Progress towards PT goals: Progressing toward goals    Frequency    Min 2X/week      PT Plan Current plan remains appropriate       AM-PAC PT "6 Clicks" Mobility   Outcome Measure  Help needed turning from your back to your side while in a flat bed without using bedrails?: A Little Help needed moving from lying on your back to sitting on the side of a flat bed without using bedrails?: A Little Help needed moving to and from a bed to a chair (including a wheelchair)?: A Little Help needed standing up from a chair using your arms (e.g., wheelchair or bedside chair)?: A Little Help needed to walk in hospital room?: A Little Help needed climbing 3-5 steps with a railing? : A Lot 6 Click Score: 17    End of Session   Activity Tolerance: Patient tolerated treatment well;Patient limited by fatigue Patient left: in chair;with call bell/phone within reach;with family/visitor present Nurse Communication: Mobility status PT Visit Diagnosis: Unsteadiness on feet (R26.81);Difficulty in walking, not elsewhere classified (R26.2);Muscle weakness (generalized) (M62.81)     Time: [id number] PT Time Calculation (min) (ACUTE ONLY): 18 min  Charges:  $Gait Training: 8-22 mins                     Julaine Fusi PTA 08/22/22, 4:54 PM

## 2022-08-22 NOTE — Progress Notes (Signed)
[place]  [phone] [phone]  ID: Kelby Aline. 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   INTERVAL HISTORY: Patient continues to have profound weakness and fatigue, but feels this is improving.  He did express frustration that his strength is not where he wants to be.  REVIEW OF SYSTEMS:   Review of Systems  Constitutional:  Positive for malaise/fatigue. Negative for fever and weight loss.  Respiratory: Negative.  Negative for cough, hemoptysis and shortness of breath.   Cardiovascular: Negative.  Negative for chest pain and leg swelling.  Gastrointestinal: Negative.  Negative for abdominal pain.  Genitourinary: Negative.  Negative for dysuria.  Musculoskeletal: Negative.  Negative for back pain.  Skin: Negative.  Negative for rash.  Neurological:  Positive for weakness. Negative for dizziness, focal weakness and headaches.  Psychiatric/Behavioral: Negative.  The patient is not nervous/anxious.     As per HPI. Otherwise, a complete review of systems is negative.  PAST MEDICAL HISTORY: Past Medical History:  Diagnosis Date   Cancer [facility])    Prostate Cancer   Elevated PSA    GERD (gastroesophageal reflux disease)    Hip pain    History of kidney stones    Hyperglycemia    Hypertension    Obesity     PAST SURGICAL HISTORY: Past Surgical History:  Procedure Laterality Date   COLONOSCOPY WITH PROPOFOL     IR IMAGING GUIDED PORT INSERTION  08/13/2022   PELVIC LYMPH NODE DISSECTION Bilateral 04/24/2019   Procedure: PELVIC LYMPH NODE DISSECTION;  Surgeon: Hollice Espy, MD;  Location: [facility] ORS;  Service: Urology;  Laterality: Bilateral;   ROBOT ASSISTED LAPAROSCOPIC RADICAL PROSTATECTOMY N/A 04/24/2019   Procedure:  XI ROBOTIC ASSISTED LAPAROSCOPIC RADICAL PROSTATECTOMY;  Surgeon: Hollice Espy, MD;  Location: [facility] ORS;  Service: Urology;  Laterality: N/A;    FAMILY HISTORY: Family History  Problem Relation Age of Onset   Bone cancer Mother    Hypertension Father    Stroke Father     ADVANCED DIRECTIVES (Y/N):  '@ADVDIR'$ @  HEALTH MAINTENANCE: Social History   Tobacco Use   Smoking status: Never   Smokeless tobacco: Never  Vaping Use   Vaping Use: Never used  Substance Use Topics   Alcohol use: Not Currently   Drug use: Never     Colonoscopy:  PAP:  Bone density:  Lipid panel:  No Known Allergies  Current Facility-Administered Medications  Medication Dose Route Frequency Provider Last Rate Last Admin   0.9 %  sodium chloride infusion   Intravenous Continuous Richarda Osmond, MD 75 mL/hr at 08/22/22 0353 Infusion Verify at 08/22/22 0353   albuterol (PROVENTIL) (2.5 MG/3ML) 0.083% nebulizer solution 2.5 mg  2.5 mg Inhalation Q4H PRN Gertie Fey, MD   2.5 mg at 08/21/22 0804   aspirin EC tablet 81 mg  81 mg Oral Daily Gertie Fey, MD   81 mg at 08/21/22 0859   cholecalciferol (VITAMIN D3) 25 MCG (1000 UNIT) tablet 5,000 Units  5,000 Units Oral Daily Gertie Fey, MD   5,000 Units at 08/21/22 0859   heparin injection 5,000 Units  5,000 Units Subcutaneous Q8H Lorin Picket, RPH   5,000 Units at 08/22/22 0646   lactulose (CHRONULAC) 10 GM/15ML solution 10 g  10 g Oral BID Richarda Osmond, MD  10 g at 08/21/22 2116   metoprolol succinate (TOPROL-XL) 24 hr tablet 100 mg  100 mg Oral Daily Gertie Fey, MD   100 mg at 08/21/22 [id number]   oxyCODONE (Oxy IR/ROXICODONE) immediate release tablet 5 mg  5 mg Oral Q4H PRN Gertie Fey, MD       pantoprazole (PROTONIX) EC tablet 40 mg  40 mg Oral Daily Gertie Fey, MD   40 mg at 08/21/22 0859   prochlorperazine (COMPAZINE) tablet 10 mg  10 mg Oral Q6H PRN Gertie Fey, MD       simvastatin (ZOCOR) tablet 20 mg  20 mg Oral QHS Gertie Fey, MD    20 mg at 08/21/22 2117   sodium chloride flush (NS) 0.9 % injection 3 mL  3 mL Intravenous Lennart Pall, MD   3 mL at 08/21/22 2117    OBJECTIVE: Vitals:   08/21/22 2124 08/22/22 0739  BP: 137/79 110/68  Pulse: 88 88  Resp: 19 17  Temp: 98.9 F (37.2 C) 97.7 F (36.5 C)  SpO2: 94% 98%     Body mass index is 35.98 kg/m.    ECOG FS:3 - Symptomatic, >50% confined to bed  General: Well-developed, well-nourished, no acute distress. Eyes: Pink conjunctiva, anicteric sclera. HEENT: Normocephalic, moist mucous membranes. Lungs: No audible wheezing or coughing. Heart: Regular rate and rhythm. Abdomen: Soft, nontender, no obvious distention. Musculoskeletal: No edema, cyanosis, or clubbing. Neuro: Alert, answering all questions appropriately. Cranial nerves grossly intact. Skin: No rashes or petechiae noted. Psych: Normal affect.  LAB RESULTS:  Lab Results  Component Value Date   NA 128 (L) 08/22/2022   K 5.1 08/22/2022   CL 99 08/22/2022   CO2 21 (L) 08/22/2022   GLUCOSE 100 (H) 08/22/2022   BUN 49 (H) 08/22/2022   CREATININE 1.22 08/22/2022   CALCIUM 9.8 08/22/2022   PROT 5.9 (L) 08/22/2022   ALBUMIN 1.8 (L) 08/22/2022   AST 151 (H) 08/22/2022   ALT 70 (H) 08/22/2022   ALKPHOS 338 (H) 08/22/2022   BILITOT 5.9 (H) 08/22/2022   GFRNONAA >60 08/22/2022   GFRAA >60 04/25/2019    Lab Results  Component Value Date   WBC 19.2 (H) 08/22/2022   NEUTROABS 13.9 (H) 07/31/2022   HGB 9.4 (L) 08/22/2022   HCT 29.9 (L) 08/22/2022   MCV 75.7 (L) 08/22/2022   PLT 711 (H) 08/22/2022     STUDIES: ECHOCARDIOGRAM COMPLETE  Result Date: 08/20/2022    ECHOCARDIOGRAM REPORT   Patient Name:   Jaden Theus. Date of Exam: 08/20/2022 Medical Rec #:  [id number]            Height:       71.0 in Accession #:    [id number]           Weight:       257.5 lb Date of Birth:  [birth year]            BSA:          2.348 m Patient Age:    62 years             BP:           117/69 mmHg Patient  Gender: M                    HR:           104 bpm. Exam Location:  [facility] Procedure: 2D Echo, Cardiac Doppler and Color Doppler Indications:  CHF-acute systolic [id number]  History:         Patient has no prior history of Echocardiogram examinations.                  Risk Factors:Hypertension.  Sonographer:     Sherrie Sport Referring Phys:  [id number] [facility] GOEL Diagnosing Phys: Kathlyn Sacramento MD  Sonographer Comments: Technically challenging study due to limited acoustic windows, no apical window and no subcostal window. Image acquisition challenging due to respiratory motion. IMPRESSIONS  1. Left ventricular ejection fraction, by estimation, is 55 to 60%. The left ventricle has normal function. Left ventricular endocardial border not optimally defined to evaluate regional wall motion. Left ventricular diastolic function could not be evaluated.  2. Right ventricular systolic function is normal. The right ventricular size is normal. Tricuspid regurgitation signal is inadequate for assessing PA pressure.  3. The mitral valve is normal in structure. No evidence of mitral valve regurgitation. No evidence of mitral stenosis.  4. The aortic valve is normal in structure. Aortic valve regurgitation is not visualized. No aortic stenosis is present.  5. Challenging image quality with only limited views. FINDINGS  Left Ventricle: Left ventricular ejection fraction, by estimation, is 55 to 60%. The left ventricle has normal function. Left ventricular endocardial border not optimally defined to evaluate regional wall motion. The left ventricular internal cavity size was normal in size. There is no left ventricular hypertrophy. Left ventricular diastolic function could not be evaluated. Right Ventricle: The right ventricular size is normal. No increase in right ventricular wall thickness. Right ventricular systolic function is normal. Tricuspid regurgitation signal is inadequate for assessing PA pressure. Left Atrium: Left atrial size  was normal in size. Right Atrium: Right atrial size was normal in size. Pericardium: There is no evidence of pericardial effusion. Mitral Valve: The mitral valve is normal in structure. No evidence of mitral valve regurgitation. No evidence of mitral valve stenosis. Tricuspid Valve: The tricuspid valve is normal in structure. Tricuspid valve regurgitation is not demonstrated. No evidence of tricuspid stenosis. Aortic Valve: The aortic valve is normal in structure. Aortic valve regurgitation is not visualized. No aortic stenosis is present. Pulmonic Valve: The pulmonic valve was normal in structure. Pulmonic valve regurgitation is not visualized. No evidence of pulmonic stenosis. Aorta: The aortic root is normal in size and structure. Venous: The inferior vena cava was not well visualized. IAS/Shunts: No atrial level shunt detected by color flow Doppler.  LEFT VENTRICLE PLAX 2D LVIDd:         3.20 cm LVIDs:         2.30 cm LV PW:         1.30 cm LV IVS:        0.80 cm LVOT diam:     2.10 cm LVOT Area:     3.46 cm  LEFT ATRIUM         Index LA diam:    4.00 cm 1.70 cm/m   AORTA Ao Root diam: 3.30 cm  SHUNTS Systemic Diam: 2.10 cm Kathlyn Sacramento MD Electronically signed by Kathlyn Sacramento MD Signature Date/Time: 08/20/2022/2:23:09 PM    Final    CT Angio Chest PE W and/or Wo Contrast  Result Date: 08/19/2022 CLINICAL DATA:  Abdominal pain and chest pain. Concern for pulmonary embolism and bowel obstruction. Metastatic adenocarcinoma of small-bowel. EXAM: CT ANGIOGRAPHY CHEST CT ABDOMEN AND PELVIS WITH CONTRAST TECHNIQUE: Multidetector CT imaging of the chest was performed using the standard protocol during bolus administration of intravenous contrast. Multiplanar  CT image reconstructions and MIPs were obtained to evaluate the vascular anatomy. Multidetector CT imaging of the abdomen and pelvis was performed using the standard protocol during bolus administration of intravenous contrast. RADIATION DOSE REDUCTION: This  exam was performed according to the departmental dose-optimization program which includes automated exposure control, adjustment of the mA and/or kV according to patient size and/or use of iterative reconstruction technique. CONTRAST:  159m OMNIPAQUE IOHEXOL 350 MG/ML SOLN COMPARISON:  Chest radiograph dated 08/19/2022 and CT dated 08/14/2022. CT abdomen pelvis dated 07/31/2022. FINDINGS: Evaluation of this exam is limited due to respiratory motion artifact. CTA CHEST FINDINGS Cardiovascular: There is no cardiomegaly or pericardial effusion. The thoracic aorta is unremarkable. Evaluation of the pulmonary arteries is limited due to respiratory motion and suboptimal visualization of the peripheral branches. No central pulmonary artery embolus identified. Mediastinum/Nodes: No hilar adenopathy. Mildly rounded subcarinal lymph node measures 8 mm short axis. The esophagus is grossly unremarkable. No mediastinal fluid collection. Right-sided Port-A-Cath with tip at the cavoatrial junction. Lungs/Pleura: Shallow inspiration. There are bibasilar linear and streaky atelectasis. Multiple bilateral pulmonary nodules measuring up to 8 mm in the upper pole right upper lobe in keeping with known metastatic disease. Trace right pleural effusion. No pneumothorax. The central airways are patent. Musculoskeletal: Degenerative changes of the spine. No acute osseous pathology. Review of the MIP images confirms the above findings. CT ABDOMEN and PELVIS FINDINGS No intra-abdominal free air. Small ascites, increased since the prior CT. Hepatobiliary: Multiple (greater than 20) hepatic hypodense metastatic disease similar or progressed since the prior CT. No calcified gallstone. Pancreas: The pancreas is unremarkable as visualized. Spleen: Normal in size without focal abnormality. Adrenals/Urinary Tract: The adrenal glands unremarkable. There is no hydronephrosis on either side. There is a 7 cm cyst in the upper pole of the left kidney.  There is symmetric enhancement and excretion of contrast by both kidneys. The visualized ureters and the urinary bladder is unremarkable. Stomach/Bowel: Ill-defined thickening of the small bowel loop in the mid abdomen as seen on the prior CT in keeping with known malignancy. There is no bowel obstruction. The appendix is normal. Vascular/Lymphatic: Mild aortoiliac atherosclerotic disease. The IVC is unremarkable. No portal venous gas. Periportal adenopathy measure approximately 19 mm in short axis. There is extensive omental nodularity, progressed since the prior CT. An omental mass in the left anterior abdomen measures 8.8 x 3.3 cm. Overall increase in the omental implants since the prior CT. Reproductive: Prostatectomy. Other: Mild subcutaneous edema. Musculoskeletal: Degenerative changes.  No acute osseous pathology. Review of the MIP images confirms the above findings. IMPRESSION: 1. No acute intrathoracic pathology. No central pulmonary artery embolus identified. 2. Multiple bilateral pulmonary nodules in keeping with known metastatic disease and relatively similar to prior CT of 08/14/2022. 3. Progression of metastatic disease in the abdomen and pelvis since the CT of 07/31/2022. There is increased ascites and progression of omental caking. 4. Ill-defined thickening of the small bowel loop in the mid abdomen in keeping with known malignancy. No bowel obstruction. Normal appendix. 5. Hepatic metastatic disease, similar or progressed since the prior CT. 6.  Aortic Atherosclerosis (ICD10-I70.0). Electronically Signed   By: AAnner CreteM.D.   On: 08/19/2022 19:05   CT ABDOMEN PELVIS W CONTRAST  Result Date: 08/19/2022 CLINICAL DATA:  Abdominal pain and chest pain. Concern for pulmonary embolism and bowel obstruction. Metastatic adenocarcinoma of small-bowel. EXAM: CT ANGIOGRAPHY CHEST CT ABDOMEN AND PELVIS WITH CONTRAST TECHNIQUE: Multidetector CT imaging of the chest was performed using the  standard  protocol during bolus administration of intravenous contrast. Multiplanar CT image reconstructions and MIPs were obtained to evaluate the vascular anatomy. Multidetector CT imaging of the abdomen and pelvis was performed using the standard protocol during bolus administration of intravenous contrast. RADIATION DOSE REDUCTION: This exam was performed according to the departmental dose-optimization program which includes automated exposure control, adjustment of the mA and/or kV according to patient size and/or use of iterative reconstruction technique. CONTRAST:  180m OMNIPAQUE IOHEXOL 350 MG/ML SOLN COMPARISON:  Chest radiograph dated 08/19/2022 and CT dated 08/14/2022. CT abdomen pelvis dated 07/31/2022. FINDINGS: Evaluation of this exam is limited due to respiratory motion artifact. CTA CHEST FINDINGS Cardiovascular: There is no cardiomegaly or pericardial effusion. The thoracic aorta is unremarkable. Evaluation of the pulmonary arteries is limited due to respiratory motion and suboptimal visualization of the peripheral branches. No central pulmonary artery embolus identified. Mediastinum/Nodes: No hilar adenopathy. Mildly rounded subcarinal lymph node measures 8 mm short axis. The esophagus is grossly unremarkable. No mediastinal fluid collection. Right-sided Port-A-Cath with tip at the cavoatrial junction. Lungs/Pleura: Shallow inspiration. There are bibasilar linear and streaky atelectasis. Multiple bilateral pulmonary nodules measuring up to 8 mm in the upper pole right upper lobe in keeping with known metastatic disease. Trace right pleural effusion. No pneumothorax. The central airways are patent. Musculoskeletal: Degenerative changes of the spine. No acute osseous pathology. Review of the MIP images confirms the above findings. CT ABDOMEN and PELVIS FINDINGS No intra-abdominal free air. Small ascites, increased since the prior CT. Hepatobiliary: Multiple (greater than 20) hepatic hypodense metastatic  disease similar or progressed since the prior CT. No calcified gallstone. Pancreas: The pancreas is unremarkable as visualized. Spleen: Normal in size without focal abnormality. Adrenals/Urinary Tract: The adrenal glands unremarkable. There is no hydronephrosis on either side. There is a 7 cm cyst in the upper pole of the left kidney. There is symmetric enhancement and excretion of contrast by both kidneys. The visualized ureters and the urinary bladder is unremarkable. Stomach/Bowel: Ill-defined thickening of the small bowel loop in the mid abdomen as seen on the prior CT in keeping with known malignancy. There is no bowel obstruction. The appendix is normal. Vascular/Lymphatic: Mild aortoiliac atherosclerotic disease. The IVC is unremarkable. No portal venous gas. Periportal adenopathy measure approximately 19 mm in short axis. There is extensive omental nodularity, progressed since the prior CT. An omental mass in the left anterior abdomen measures 8.8 x 3.3 cm. Overall increase in the omental implants since the prior CT. Reproductive: Prostatectomy. Other: Mild subcutaneous edema. Musculoskeletal: Degenerative changes.  No acute osseous pathology. Review of the MIP images confirms the above findings. IMPRESSION: 1. No acute intrathoracic pathology. No central pulmonary artery embolus identified. 2. Multiple bilateral pulmonary nodules in keeping with known metastatic disease and relatively similar to prior CT of 08/14/2022. 3. Progression of metastatic disease in the abdomen and pelvis since the CT of 07/31/2022. There is increased ascites and progression of omental caking. 4. Ill-defined thickening of the small bowel loop in the mid abdomen in keeping with known malignancy. No bowel obstruction. Normal appendix. 5. Hepatic metastatic disease, similar or progressed since the prior CT. 6.  Aortic Atherosclerosis (ICD10-I70.0). Electronically Signed   By: AAnner CreteM.D.   On: 08/19/2022 19:05   [place]Venous  Img Lower Bilateral  Result Date: 08/19/2022 CLINICAL DATA:  BLE edema EXAM: BILATERAL LOWER EXTREMITY VENOUS DOPPLER ULTRASOUND TECHNIQUE: Gray-scale sonography with compression, as well as color and duplex ultrasound, were performed to evaluate the deep  venous system(s) from the level of the common femoral vein through the popliteal and proximal calf veins. COMPARISON:  None Available. FINDINGS: VENOUS Normal compressibility of the common femoral, superficial femoral, and popliteal veins, as well as the visualized calf veins. Visualized portions of profunda femoral vein and great saphenous vein unremarkable. No filling defects to suggest DVT on grayscale or color Doppler imaging. Doppler waveforms show normal direction of venous flow, normal respiratory plasticity and response to augmentation. OTHER None. Limitations: none IMPRESSION: Negative. Electronically Signed   By: Valentino Saxon M.D.   On: 08/19/2022 17:49   DG Chest 2 View  Result Date: 08/19/2022 CLINICAL DATA:  History of liver cancer.  Bilateral leg swelling EXAM: CHEST - 2 VIEW COMPARISON:  CT of the chest August 14, 2022 FINDINGS: Bibasilar atelectasis remains. A right Port-A-Cath is in good position. No pneumothorax. The cardiomediastinal silhouette is stable. No other suspicious findings. IMPRESSION: Bibasilar atelectasis. No other acute abnormalities. Electronically Signed   By: Dorise Bullion III M.D.   On: 08/19/2022 16:23   CT Chest W Contrast  Result Date: 08/15/2022 CLINICAL DATA:  Staging exam. 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 * Tracking Code: BO * EXAM: CT CHEST WITH CONTRAST TECHNIQUE: Multidetector CT imaging of the chest was performed during intravenous contrast administration. RADIATION DOSE REDUCTION: This exam was performed according to the departmental dose-optimization program which includes automated exposure control, adjustment of the mA and/or kV according to patient size and/or use of iterative  reconstruction technique. CONTRAST:  31m OMNIPAQUE IOHEXOL 300 MG/ML  SOLN COMPARISON:  None Available. FINDINGS: Cardiovascular: The heart size is normal. No substantial pericardial effusion. Coronary artery calcification is evident. No thoracic aortic aneurysm. Right Port-A-Cath tip is positioned at the SVC/RA junction. Enlargement of the pulmonary outflow tract/main pulmonary arteries suggests pulmonary arterial hypertension. Mediastinum/Nodes: Mild lymphadenopathy noted around the inferior descending thoracic aorta. There is no hilar lymphadenopathy. The esophagus has normal imaging features. There is no axillary lymphadenopathy. Left-sided retrocrural lymphadenopathy evident. Lungs/Pleura: Numerous bilateral pulmonary nodules identified involving all lobes of both lungs. Index right upper lobe pulmonary nodule on image 52/4 measures 8 mm. Index nodule in the left upper lobe measures 8 mm on image 34/4. Left lower lobe index nodule measures 8 mm on image 91/4. There is bilateral lower lobe atelectasis, right greater than left. No dense focal airspace consolidation. No pleural effusion. Upper Abdomen: Multiple hepatic metastases again noted as characterized on recent abdomen/pelvis CT. Index lesion today in the anterior hepatic dome measuring 2.9 cm on image 101/2 was 2.4 cm previously (remeasured). A second lesion in the medial right liver measuring 3.1 cm on image 115/2 today was 2.7 cm previously (remeasured). Perihepatic ascites is progressive in the interval with new Peri splenic ascites evident on today's exam. Musculoskeletal: No worrisome lytic or sclerotic osseous abnormality. IMPRESSION: 1. Numerous bilateral pulmonary nodules involving all lobes of both lungs measure up to 8 mm in size, consistent with metastatic disease. 2. Mild lymphadenopathy around the inferior descending thoracic aorta and in the left retrocrural space, consistent with metastatic disease. 3. Multiple hepatic metastases as  characterized on recent abdomen/pelvis CT. Lesions visible in the upper liver today appear minimally progressive in the 2 week interval since the prior abdomen/pelvis CT 4. Interval progression of perihepatic ascites with new perisplenic ascites evident on today's exam. 5. Enlargement of the pulmonary outflow tract/main pulmonary arteries suggests pulmonary arterial hypertension. Electronically Signed   By: EMisty StanleyM.D.   On: 08/15/2022 07:05  IR IMAGING GUIDED PORT INSERTION  Result Date: 08/13/2022 INDICATION: port placement for chemotherapy EXAM: Chest port placement using ultrasound and fluoroscopic guidance MEDICATIONS: Documented in the EMR ANESTHESIA/SEDATION: Moderate (conscious) sedation was employed during this procedure. A total of Versed 2 mg and Fentanyl 100 mcg was administered intravenously. Moderate Sedation Time: 30 minutes. The patient's level of consciousness and vital signs were monitored continuously by radiology nursing throughout the procedure under my direct supervision. FLUOROSCOPY TIME:  Fluoroscopy Time: 0.9 minutes (8 mGy) COMPLICATIONS: None immediate. PROCEDURE: Informed written consent was obtained from the patient after a thorough discussion of the procedural risks, benefits and alternatives. All questions were addressed. Maximal Sterile Barrier Technique was utilized including caps, mask, sterile gowns, sterile gloves, sterile drape, hand hygiene and skin antiseptic. A timeout was performed prior to the initiation of the procedure. The patient was placed supine on the exam table. The right neck and chest was prepped and draped in the standard sterile fashion. A preliminary ultrasound of the right neck was performed and demonstrates a patent right internal jugular vein. A permanent ultrasound image was stored in the electronic medical record. The overlying skin was anesthetized with 1% Lidocaine. Using ultrasound guidance, access was obtained into the right internal jugular  vein using a 21 gauge micropuncture set. A wire was advanced into the SVC, a short incision was made at the puncture site, and serial dilatation performed. Next, in an ipsilateral infraclavicular location, an incision was made at the site of the subcutaneous reservoir. Blunt dissection was used to open a pocket to contain the reservoir. A subcutaneous tunnel was then created from the port site to the puncture site. A(n) 8 Fr single lumen catheter was advanced through the tunnel. The catheter was attached to the port and this was placed in the subcutaneous pocket. Under fluoroscopic guidance, a peel away sheath was placed, and the catheter was trimmed to the appropriate length and was advanced into the central veins. The catheter length is 23 cm. The tip of the catheter lies near the superior cavoatrial junction. The port flushes and aspirates appropriately. The port was flushed and locked with heparinized saline. The port pocket was closed in 2 layers using 3-0 and 4-0 Vicryl/absorbable suture. Dermabond was also applied to both incisions. The patient tolerated the procedure well and was transferred to recovery in stable condition. IMPRESSION: Successful placement of a right-sided chest port via the right internal jugular vein. The port is ready for immediate use. Electronically Signed   By: Albin Felling M.D.   On: 08/13/2022 16:14   US BIOPSY (LIVER)  Result Date: 08/06/2022 INDICATION: liver mass EXAM: ULTRASOUND GUIDED LIVER MASS BIOPSY COMPARISON:  CT AP, 07/31/2022. MEDICATIONS: None ANESTHESIA/SEDATION: Moderate (conscious) sedation was employed during this procedure. A total of Versed 1 mg and Fentanyl 50 mcg was administered intravenously. Moderate Sedation Time: 10 minutes. The patient's level of consciousness and vital signs were monitored continuously by radiology nursing throughout the procedure under my direct supervision. COMPLICATIONS: None immediate. PROCEDURE: Informed written consent was  obtained from the patient and/or patient's representative after a discussion of the risks, benefits and alternatives to treatment. The patient understands and consents the procedure. A timeout was performed prior to the initiation of the procedure. Ultrasound scanning was performed of the right upper abdominal quadrant demonstrates multifocal liver masses A LEFT hepatic lobe mass was selected for biopsy and the procedure was planned. The right upper abdominal quadrant was prepped and draped in the usual sterile fashion. The overlying soft  tissues were anesthetized with 1% lidocaine with epinephrine. A 17 gauge, 6.8 cm co-axial needle was advanced into a peripheral aspect of the lesion. This was followed by 3 core biopsies with an 18 gauge core device under direct ultrasound guidance. The coaxial needle tract was embolized with a small amount of Gel-Foam slurry and superficial hemostasis was obtained with manual compression. Post procedural scanning was negative for definitive area of hemorrhage or additional complication. A dressing was placed. The patient tolerated the procedure well without immediate post procedural complication. IMPRESSION: Successful ultrasound guided core needle biopsy of liver mass. Michaelle Birks, MD [facility] Electronically Signed   By: Michaelle Birks M.D.   On: 08/06/2022 14:53   CT ABDOMEN PELVIS W CONTRAST  Result Date: 07/31/2022 CLINICAL DATA:  30 pound weight loss. Increased calcium level. Increased liver enzymes. Elevated white blood cell count. Prostatectomy 4 years ago. * Tracking Code: BO * EXAM: CT ABDOMEN AND PELVIS WITH CONTRAST TECHNIQUE: Multidetector CT imaging of the abdomen and pelvis was performed using the standard protocol following bolus administration of intravenous contrast. RADIATION DOSE REDUCTION: This exam was performed according to the departmental dose-optimization program which includes automated  exposure control, adjustment of the mA and/or kV according to patient size and/or use of iterative reconstruction technique. CONTRAST:  148m OMNIPAQUE IOHEXOL 300 MG/ML  SOLN COMPARISON:  05/29/2022 chest radiograph.  Prostate MRI 03/06/2019. FINDINGS: Lower chest: Bibasilar pulmonary nodules. Example right middle lobe 5 mm nodule on 08/04. Normal heart size without pericardial or pleural effusion. Areas of left-sided pleural-based nodularity versus subpleural lymph nodes. Example 8 mm on 06/02. Hepatobiliary: Innumerable, bilateral liver masses consistent with metastasis. Example posterior right hepatic lobe (segment 7) 3.7 x 3.0 cm on 21/2. Central anterior segment 2 mass measures 3.4 x 3.2 cm on 20/2. No calcified gallstone or biliary duct dilatation. Pancreas: Normal, without mass or ductal dilatation. Spleen: Normal in size, without focal abnormality. Adrenals/Urinary Tract: Normal right adrenal gland. Mild left adrenal thickening and nodularity, including at up to 1.0 cm on 25/2. Interpolar left renal 5.7 cm fluid density lesion is likely a cyst . In the absence of clinically indicated signs/symptoms require(s) no independent follow-up. Normal right kidney. No hydronephrosis. Normal urinary bladder. Stomach/Bowel: Proximal gastric underdistention. Scattered colonic diverticula. Normal terminal ileum and appendix. A loop of markedly thickened jejunum is identified on 57/2 and coronal image 57. Vascular/Lymphatic: Aortic atherosclerosis. Porta hepatis adenopathy including a portacaval 1.6 cm node on 36/2. Small bowel mesenteric adenopathy, centered about the thickened small bowel loop. Example nodes at up to 2.6 x 2.4 cm on 55/2. No pelvic sidewall adenopathy. Reproductive: Prostatectomy, without local recurrence. Other: Small volume abdominopelvic ascites. Extensive peritoneal metastasis. Example left omental implant of 4.2 x 4.5 cm on 47/2. No free intraperitoneal air. Tiny fat containing right inguinal  hernia. Small fat containing paraumbilical hernia. Mild right-sided gynecomastia. Musculoskeletal: Presumably degenerative partial fusion of the bilateral sacroiliac joints. Lumbosacral spondylosis. IMPRESSION: 1. Widespread metastatic disease, including to liver, abdominal nodes, peritoneum, and likely the left pleural space. 2. 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 3. Small bibasilar pulmonary nodules, nonspecific but mildly suspicious for metastatic disease. 4. Small volume abdominopelvic ascites. 5. Prostatectomy, without local recurrence. Distribution of metastatic disease felt unlikely to represent prostate. 6. Indeterminate left adrenal nodule. 7.  Aortic Atherosclerosis (ICD10-I70.0). Electronically Signed   By: KAbigail MiyamotoM.D.   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  Given patient's admission to the hospital and declining performance status, will delay treatment until patient's performance status and strength improve.  Appreciate palliative care input. Weakness and fatigue: Multifactorial including poor appetite and progressive malignancy.  Appreciate PT and OT input.  Patient will likely need SNF placement for rehab upon discharge. Peripheral edema/ascites: Likely secondary to fluid overload and malignancy. Patient current only under fluid restriction and diuresis as needed. Hyponatremia: Slowly improving.  Patient's sodium level is 128 today. Hypercalcemia: Resolved.  Secondary to malignancy.  Patient received 4 mg IV Zometa on August 19, 2022. Leukocytosis: Chronic and unchanged.  Likely reactive, monitor. Anemia: Chronic and unchanged.  Monitor. Thrombocytosis: Likely reactive, monitor. Transaminitis: Possibly  secondary to metastatic disease.  Monitor.  Will follow.  Lloyd Huger, MD   08/22/2022 9:02 AM

## 2022-08-23 DIAGNOSIS — E875 Hyperkalemia: Secondary | ICD-10-CM

## 2022-08-23 DIAGNOSIS — N179 Acute kidney failure, unspecified: Secondary | ICD-10-CM | POA: Diagnosis not present

## 2022-08-23 DIAGNOSIS — E871 Hypo-osmolality and hyponatremia: Secondary | ICD-10-CM | POA: Diagnosis not present

## 2022-08-23 LAB — COMPREHENSIVE METABOLIC PANEL
ALT: 89 U/L — ABNORMAL HIGH (ref 0–44)
AST: 201 U/L — ABNORMAL HIGH (ref 15–41)
Albumin: 1.9 g/dL — ABNORMAL LOW (ref 3.5–5.0)
Alkaline Phosphatase: 414 U/L — ABNORMAL HIGH (ref 38–126)
Anion gap: 8 (ref 5–15)
BUN: 57 mg/dL — ABNORMAL HIGH (ref 8–23)
CO2: 20 mmol/L — ABNORMAL LOW (ref 22–32)
Calcium: 9.5 mg/dL (ref 8.9–10.3)
Chloride: 98 mmol/L (ref 98–111)
Creatinine, Ser: 1.51 mg/dL — ABNORMAL HIGH (ref 0.61–1.24)
GFR, Estimated: 52 mL/min — ABNORMAL LOW (ref >60)
Glucose, Bld: 103 mg/dL — ABNORMAL HIGH (ref 70–99)
Potassium: 5.2 mmol/L — ABNORMAL HIGH (ref 3.5–5.1)
Sodium: 126 mmol/L — ABNORMAL LOW (ref 135–145)
Total Bilirubin: 6.7 mg/dL — ABNORMAL HIGH (ref 0.3–1.2)
Total Protein: 6.3 g/dL — ABNORMAL LOW (ref 6.5–8.1)

## 2022-08-23 LAB — BASIC METABOLIC PANEL
Anion gap: 10 (ref 5–15)
BUN: 62 mg/dL — ABNORMAL HIGH (ref 8–23)
CO2: 20 mmol/L — ABNORMAL LOW (ref 22–32)
Calcium: 9.2 mg/dL (ref 8.9–10.3)
Chloride: 95 mmol/L — ABNORMAL LOW (ref 98–111)
Creatinine, Ser: 1.78 mg/dL — ABNORMAL HIGH (ref 0.61–1.24)
GFR, Estimated: 43 mL/min — ABNORMAL LOW (ref >60)
Glucose, Bld: 123 mg/dL — ABNORMAL HIGH (ref 70–99)
Potassium: 5.2 mmol/L — ABNORMAL HIGH (ref 3.5–5.1)
Sodium: 125 mmol/L — ABNORMAL LOW (ref 135–145)

## 2022-08-23 LAB — CBC
HCT: 30.9 % — ABNORMAL LOW (ref 39.0–52.0)
Hemoglobin: 9.9 g/dL — ABNORMAL LOW (ref 13.0–17.0)
MCH: 24.4 pg — ABNORMAL LOW (ref 26.0–34.0)
MCHC: 32 g/dL (ref 30.0–36.0)
MCV: 76.1 fL — ABNORMAL LOW (ref 80.0–100.0)
Platelets: 719 10*3/uL — ABNORMAL HIGH (ref 150–400)
RBC: 4.06 MIL/uL — ABNORMAL LOW (ref 4.22–5.81)
RDW: 24.9 % — ABNORMAL HIGH (ref 11.5–15.5)
WBC: 19 10*3/uL — ABNORMAL HIGH (ref 4.0–10.5)
nRBC: 0 % (ref 0.0–0.2)

## 2022-08-23 LAB — MAGNESIUM: Magnesium: 2.6 mg/dL — ABNORMAL HIGH (ref 1.7–2.4)

## 2022-08-23 MED ORDER — FUROSEMIDE 10 MG/ML IJ SOLN
20.0000 mg | Freq: Once | INTRAMUSCULAR | Status: AC
  Administered 2022-08-23: 20 mg via INTRAVENOUS
  Filled 2022-08-23: qty 2

## 2022-08-23 MED ORDER — SODIUM CHLORIDE 0.9 % IV SOLN: INTRAVENOUS | Status: DC

## 2022-08-23 MED ORDER — ORAL CARE MOUTH RINSE: 15.0000 mL | OROMUCOSAL | Status: DC | PRN

## 2022-08-23 MED ORDER — PATIROMER SORBITEX CALCIUM 8.4 G PO PACK
8.4000 g | PACK | Freq: Every day | ORAL | Status: DC
  Administered 2022-08-23 – 2022-08-24 (×2): 8.4 g via ORAL
  Filled 2022-08-23 (×4): qty 1

## 2022-08-23 NOTE — Progress Notes (Addendum)
PROGRESS NOTE  Philip Arnold.    DOB: 1960-07-04, 62 y.o.  [id number]    Code Status: Full Code   DOA: 08/19/2022   LOS: 4   Brief hospital course  Philip Arnold. is a 62 y.o. male with a PMH significant for metastatic carcinoma of small bowel, HTN anemia, prostate cancer, obesity, HLD, .  They presented from home to the ED on 08/19/2022 with generalized weakness and worsened swelling x 4 days. Also endorses poor appetite.  Had liver biopsy 08/06/22 which showed metastatic adenocarcinoma. He was due to start therapy outpatient at cancer center 3/4. Dr. Grayland Ormond aware of admission.   In the ED, it was found that they had stable vital signs ORA. He did later develop some mild hypoxia and required 3L [place].  Significant findings included WBC 19.9, hgb 10.7, platelets 811, PT/INR 16.8/1.4troponin neg, Na+ 124, K+ 5.6, Cl- 91, glucose 122, Cr 1.31, albumin 2.1, AST/ALT 138/64, alk phos 372, total bili 5.7, BNP 41.5, lipase 64, Mg+ 2.8. Blood cultures were collected. Chest xray: bibasilar atelectasis LE doppler: negative DVT CTA chest: showed known metastatic nodules without PE CT abdomen pelvis significant findings: Progression of metastatic disease in the abdomen and pelvis since the CT of 07/31/2022. There is increased ascites and progression of omental caking.  Ill-defined thickening of the small bowel loop in the mid abdomen in keeping with known malignancy. No bowel obstruction.  Hepatic metastatic disease, similar or progressed since the prior CT.  They were initially treated with CTX, flagyl. Heme/onc consulted.    Patient was admitted to medicine service for further workup and management of generalized weakness as outlined in detail below.  Hospital course complicated by persistent electrolyte derangements, worsening renal function and ongoing poor PO intake in general.  Patient was seen by PT/OT and SNF recommended for rehab at discharge.  Oncology holding off initiation of  chemotherapy pending improvement in pt's functional / performance status.   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 Suspicion for possible hepatic encephalopathy. Ammonia was mildly elevated, improved.  - continue lactulose - registered dietician consult - electrolyte management  - avoid sedating medications - PT/OT- recommending SNF at dc. Dispo plan pending family discussion with palliative for GOC  Anasarca- given abdominal size and increased interval pleural fluid on imaging, I was expecting to order paracentesis but patient has no tenderness to palpation on exam so continue to hold off. 1+ pitting edema bilateral LE. From liver disease/leaky gut. Echo is unremarkable.  With fluid restriction alone, patient had worsening of hyponatremia so will attempt gentle IVF today to address since his echo was normal and abdomen is non-tender. Will need to watch fluid status carefully and  - continue fluid restrict - diuresis PRN - strict I/O - daily weights  Acute kidney injury - Cr today 1.51, has slowly trended up since admission.  He was getting fluids 3/5 >> 3/6 and Cr increased.  Given ongoing hypervolemia, will give gentle diuresis and repeat BMP this afternoon. - Encourage PO intake  Hyponatremia- Hypervolemic.  acute on chronic.  Moderate and possible mild symptomatic contributing to his overall weakness, fatigue, mild confusion.  Na level 3 months ago was mildly low at 131. Na+ 125>127>122 (3/5 - started gentle fluids) > 128 > 124  (yesterday afternoon - fluids stopped). Na this AM 126, slightly improved off IV fluids Initially just fluid restricted to avoid fluid overload but started gentle NS fluids 3/5, then became dyspneic. - Goal to improve 4-47mol per 24 hour period. May have to change to hypertonic solution if not improving. - Diuresis today - 20 mg Lasix IV x 1 this AM - repeat BMP at 4pm today and in AM - Further diuresis based on response - telemetry monitoring - hold HCTZ - fluid restriction - frequent neuro checks - daily weights to assess volume status   Hyperkalemia- K+ 5.4>5.0>5.3>5.1. Hemodynamically stable - previously treated with albuterol, lokelma.  - 3/7: K 5.2 - will treat with Veltassa to avoid [facility] - monitor on telemetry  Acute hypoxic respiratory failure- resolved. currently on room air. due to volume overload although no overt congestion on xray and lungs clear on exam. May be also contributed by hiccups.  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  Plan was to start outpatient treatment 3/4 but was admitted for progressive generalized weakness. - heme/onc following, appreciate care - palliative care consulted - follow liver labs which have mild improvement today - supportive care PRN - heparin DVT ppx for hypercoagulable state  Hypercalcemia- resolved. Due to malignancy. Treated with Zometa 4 mg IV on 08/19/22. - monitor BMP   Leukocytosis- acute reaction. no overt signs of infection.  - monitor blood cultures, NGTD - no indication for antibiotics unless s/sx's of infection develop   TSH elevation- free T4 mildly elevated 1.19. PCP repeat TSH/free T4 in several weeks after acute issues have resolved   Hypermagnesemia- improving. Mg++ 2.8>2.5 Due to AKI Monitor BMP Telemetry   Thrombocytosis - suspect reactive Heme-onc  evaluation Follow CBC  AKI- resolved  Body mass index is 36.34 kg/m.  VTE ppx: heparin injection 5,000 Units Start: 08/20/22 2200 SCDs Start: 08/19/22 2224  Diet:     Diet   Diet renal with fluid restriction Fluid restriction: 1200 mL Fluid; Room service appropriate? Yes; Fluid consistency: Thin   Consultants: Heme/onc  Subjective 08/23/22    Pt up in recliner when seen this AM.  Reports still feeling weak and tired, and having some "brain fog".  Expresses frustration about general situation.  His table with breakfast was left out of his reach when staff left the room.  Poor appetite, not interested in the food on his tray.     Objective   Vitals:   08/22/22 2342 08/23/22 0252 08/23/22 0404 08/23/22 0730  BP: 124/68  115/66 107/61  Pulse: 82  75 82  Resp: '19  18 20  '$ Temp: 98.9 F (37.2 C)  98.7 F (37.1 C)   TempSrc:      SpO2: 98%  96% 98%  Weight:  118.2 kg    Height:        Intake/Output Summary (Last 24 hours) at 08/23/2022 1208 Last data filed at 08/23/2022 1010 Gross per 24 hour  Intake 385 ml  Output 250 ml  Net 135 ml   Filed Weights   08/19/22 2144 08/21/22 1400 08/23/22 0252  Weight: 116.8 kg 117 kg 118.2 kg     Physical  Exam:  General: awake, appears drowsy, NAD HEENT: anicteric sclera, MMM, hearing grossly normal Respiratory: normal respiratory effort. CTAB Cardiovascular: quick capillary refill, normal S1/S2, RRR Gastrointestinal: obese, non-tender to palpation. Mildly distended Nervous: A&O x3. no gross focal neurologic deficits, normal speech Extremities: moves all equally, trace to 1+ lower extremity edema Skin: dry, intact, normal temperature Psychiatry: depressed mood, flat affect, intact judgment and insight  Labs   I have personally reviewed the following labs and imaging studies CBC    Component Value Date/Time   WBC 19.0 (H) 08/23/2022 0521   RBC 4.06 (L) 08/23/2022 0521   HGB 9.9 (L) 08/23/2022 0521   HCT 30.9 (L) 08/23/2022  0521   PLT 719 (H) 08/23/2022 0521   MCV 76.1 (L) 08/23/2022 0521   MCH 24.4 (L) 08/23/2022 0521   MCHC 32.0 08/23/2022 0521   RDW 24.9 (H) 08/23/2022 0521   LYMPHSABS 1.5 07/31/2022 0900   MONOABS 1.6 (H) 07/31/2022 0900   EOSABS 0.1 07/31/2022 0900   BASOSABS 0.1 07/31/2022 0900      Latest Ref Rng & Units 08/23/2022    5:21 AM 08/22/2022   11:31 AM 08/22/2022    4:53 AM  BMP  Glucose 70 - 99 mg/dL 103   100   BUN 8 - 23 mg/dL 57   49   Creatinine 0.61 - 1.24 mg/dL 1.51   1.22   Sodium 135 - 145 mmol/L 126  124  128   Potassium 3.5 - 5.1 mmol/L 5.2   5.1   Chloride 98 - 111 mmol/L 98   99   CO2 22 - 32 mmol/L 20   21   Calcium 8.9 - 10.3 mg/dL 9.5   9.8     No results found.  Disposition Plan & Communication   Patient status: Inpatient  Admitted From: Home Planned disposition location: Home vs SNF (recommended)  Anticipated discharge date:  pending improvement and stability of electrolytes  Family Communication: wife at bedside on rounds 3/6   Author: Ezekiel Slocumb, DO [facility] 08/23/2022, 12:08 PM   Available by Epic secure chat 7AM-7PM. If 7PM-7AM, please contact night-coverage.  [facility] contact information found on [email or web address].

## 2022-08-23 NOTE — Plan of Care (Signed)

## 2022-08-24 DIAGNOSIS — E871 Hypo-osmolality and hyponatremia: Secondary | ICD-10-CM | POA: Diagnosis not present

## 2022-08-24 DIAGNOSIS — C787 Secondary malignant neoplasm of liver and intrahepatic bile duct: Secondary | ICD-10-CM | POA: Diagnosis not present

## 2022-08-24 DIAGNOSIS — R7989 Other specified abnormal findings of blood chemistry: Secondary | ICD-10-CM

## 2022-08-24 DIAGNOSIS — R601 Generalized edema: Secondary | ICD-10-CM | POA: Diagnosis not present

## 2022-08-24 DIAGNOSIS — N179 Acute kidney failure, unspecified: Secondary | ICD-10-CM | POA: Diagnosis not present

## 2022-08-24 DIAGNOSIS — Z515 Encounter for palliative care: Secondary | ICD-10-CM | POA: Diagnosis not present

## 2022-08-24 LAB — COMPREHENSIVE METABOLIC PANEL
ALT: 102 U/L — ABNORMAL HIGH (ref 0–44)
AST: 250 U/L — ABNORMAL HIGH (ref 15–41)
Albumin: 1.8 g/dL — ABNORMAL LOW (ref 3.5–5.0)
Alkaline Phosphatase: 528 U/L — ABNORMAL HIGH (ref 38–126)
Anion gap: 12 (ref 5–15)
BUN: 63 mg/dL — ABNORMAL HIGH (ref 8–23)
CO2: 17 mmol/L — ABNORMAL LOW (ref 22–32)
Calcium: 9 mg/dL (ref 8.9–10.3)
Chloride: 94 mmol/L — ABNORMAL LOW (ref 98–111)
Creatinine, Ser: 1.65 mg/dL — ABNORMAL HIGH (ref 0.61–1.24)
GFR, Estimated: 47 mL/min — ABNORMAL LOW (ref >60)
Glucose, Bld: 89 mg/dL (ref 70–99)
Potassium: 4.7 mmol/L (ref 3.5–5.1)
Sodium: 123 mmol/L — ABNORMAL LOW (ref 135–145)
Total Bilirubin: 6.9 mg/dL — ABNORMAL HIGH (ref 0.3–1.2)
Total Protein: 5.8 g/dL — ABNORMAL LOW (ref 6.5–8.1)

## 2022-08-24 LAB — CULTURE, BLOOD (ROUTINE X 2)
Culture: NO GROWTH
Culture: NO GROWTH
Special Requests: ADEQUATE

## 2022-08-24 LAB — CBC
HCT: 30 % — ABNORMAL LOW (ref 39.0–52.0)
Hemoglobin: 9.5 g/dL — ABNORMAL LOW (ref 13.0–17.0)
MCH: 24 pg — ABNORMAL LOW (ref 26.0–34.0)
MCHC: 31.7 g/dL (ref 30.0–36.0)
MCV: 75.8 fL — ABNORMAL LOW (ref 80.0–100.0)
Platelets: 653 10*3/uL — ABNORMAL HIGH (ref 150–400)
RBC: 3.96 MIL/uL — ABNORMAL LOW (ref 4.22–5.81)
RDW: 24.4 % — ABNORMAL HIGH (ref 11.5–15.5)
WBC: 19.6 10*3/uL — ABNORMAL HIGH (ref 4.0–10.5)
nRBC: 0 % (ref 0.0–0.2)

## 2022-08-24 LAB — PTH-RELATED PEPTIDE: PTH-related peptide: <2 pmol/L

## 2022-08-24 MED ORDER — BACLOFEN 10 MG PO TABS
10.0000 mg | ORAL_TABLET | Freq: Three times a day (TID) | ORAL | Status: DC | PRN
  Administered 2022-08-24 – 2022-08-25 (×2): 10 mg via ORAL
  Filled 2022-08-24 (×2): qty 1

## 2022-08-24 NOTE — Progress Notes (Signed)
PT Cancellation Note  Patient Details Name: Philip Arnold. MRN: [id number] DOB: Sep 03, 1960   Cancelled Treatment:     PT attempt. Pt getting a bath with RN staff assisting him. Will return at a later time/date when pt is available to participate.    Willette Pa 08/24/2022, 12:32 PM

## 2022-08-24 NOTE — Progress Notes (Signed)
This Chap responded to a spiritual consult visiting Pt and FAM at bedside. Pt requested help to complete Adv Dir forms. This Melven Sartorius gladly facilitated this need and now the Pt have Adv Dir on file.   08/24/22 1200  Spiritual Encounters  Type of Visit Initial  Care provided to: Pt and family  Conversation partners present during encounter Nurse;Physician  Referral source Patient request  Reason for visit Advance directives  OnCall Visit Yes  Spiritual Framework  Presenting Themes Goals in life/care  Interventions  Spiritual Care Interventions Made Decision-making support/facilitation;Established relationship of care and support  Intervention Outcomes  Outcomes Connection to values and goals of care;Connection to spiritual care  Spiritual Care Plan  Spiritual Care Issues Still Outstanding No further spiritual care needs at this time (see row info)  Advance Directives (For Healthcare)  Does Patient Have a Medical Advance Directive? Yes

## 2022-08-24 NOTE — Progress Notes (Signed)
PROGRESS NOTE  Philip Arnold.    DOB: 03-08-1961, 62 y.o.  [id number]    Code Status: DNR   DOA: 08/19/2022   LOS: 5   Brief hospital course  Philip Hearn. is a 62 y.o. male with a PMH significant for metastatic carcinoma of small bowel, HTN anemia, prostate cancer, obesity, HLD, .  They presented from home to the ED on 08/19/2022 with generalized weakness and worsened swelling x 4 days. Also endorses poor appetite.  Had liver biopsy 08/06/22 which showed metastatic adenocarcinoma. He was due to start therapy outpatient at cancer center 3/4. Dr. Grayland Arnold aware of admission.   In the ED, it was found that they had stable vital signs ORA. He did later develop some mild hypoxia and required 3L .  Significant findings included WBC 19.9, hgb 10.7, platelets 811, PT/INR 16.8/1.4troponin neg, Na+ 124, K+ 5.6, Cl- 91, glucose 122, Cr 1.31, albumin 2.1, AST/ALT 138/64, alk phos 372, total bili 5.7, BNP 41.5, lipase 64, Mg+ 2.8. Blood cultures were collected. Chest xray: bibasilar atelectasis LE doppler: negative DVT CTA chest: showed known metastatic nodules without PE CT abdomen pelvis significant findings: Progression of metastatic disease in the abdomen and pelvis since the CT of 07/31/2022. There is increased ascites and progression of omental caking.  Ill-defined thickening of the small bowel loop in the mid abdomen in keeping with known malignancy. No bowel obstruction.  Hepatic metastatic disease, similar or progressed since the prior CT.  They were initially treated with CTX, flagyl. Heme/onc consulted.    Patient was admitted to medicine service for further workup and management of generalized weakness as outlined in detail below.  Hospital course complicated by persistent electrolyte derangements, worsening renal function and ongoing poor PO intake in general.  Patient was seen by PT/OT and SNF recommended for rehab at discharge.  Oncology holding off initiation of  chemotherapy pending improvement in pt's functional / performance status.   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 Suspicion for possible hepatic encephalopathy. Ammonia was mildly elevated, improved.  - continue lactulose - registered dietician consult - electrolyte management  - avoid sedating medications - PT/OT- recommending SNF at dc. Dispo plan pending family discussion with palliative for GOC  Anasarca- given abdominal size and increased interval pleural fluid on imaging, I was expecting to order paracentesis but patient has no tenderness to palpation on exam so continue to hold off. 1+ pitting edema bilateral LE. From liver disease/leaky gut. Echo is unremarkable.  With fluid restriction alone, patient had worsening of hyponatremia so will attempt gentle IVF today to address since his echo was normal and abdomen is non-tender. Will need to watch fluid status carefully and  - continue fluid restrict - diuresis PRN / per nephrology given worsening renal function - strict I/O - daily weights  Acute kidney injury - Cr today 1.51, has slowly trended up since admission.  He was getting fluids 3/5 >> 3/6 and Cr increased.  Given ongoing hypervolemia, given gentle diuresis 3/7 but renal function and Na worsened. -  monitor BMP - Nephrology consulted  Hyponatremia- Hypervolemic.  acute on chronic.  Moderate and possible mild symptomatic contributing to his overall weakness, fatigue, mild confusion.  Na level 3 months ago was mildly low at  131. Na+ 125>127>122 (3/5 - started gentle fluids) > 128 > 124 (yesterday afternoon - fluids stopped). Na this AM 126, slightly improved off IV fluids Initially just fluid restricted to avoid fluid overload but started gentle NS fluids 3/5, then became dyspneic. - Goal to improve 4-10mol per 24 hour period. May have to change to hypertonic solution if not improving. - Attempted diuresis yesterday (3/7) but renal function and sodium worsened - Gentle fluids overnight, Na dropped further, renal function improved - Consult Nephrology, appreciate assistance - serial BMP's to monitor closely - telemetry monitoring - hold HCTZ - fluid restriction - frequent neuro checks - daily weights to assess volume status   Hyperkalemia- 3/8: K 4.7 - previously treated with albuterol, lokelma.  - started on Veltassa 3/7 ([facility]) - monitor on telemetry  Acute hypoxic respiratory failure- resolved. currently on room air. due to volume overload although no overt congestion on xray and lungs clear on exam. May be also contributed by hiccups.  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  Plan was to start outpatient treatment 3/4 but was admitted for progressive generalized weakness. Overall, prognosis is poor. Chemotherapy now delayed due to declining functional status. LFT's steadily increasing since admission, due to metastatic liver involvement - oncology and palliative are consulted, appreciate your assistance - pt and wife are considering transition to hospice if not improving over this weekend - supportive care PRN - heparin DVT ppx for hypercoagulable state  Hypercalcemia- resolved. Due to malignancy. Treated with Zometa 4 mg IV on 08/19/22. - monitor BMP   Leukocytosis- acute reaction. no overt signs of infection.  - monitor blood cultures, NGTD -  no indication for antibiotics unless s/sx's of infection develop   TSH elevation- free T4 mildly elevated 1.19. PCP repeat TSH/free T4 in several weeks after acute issues have resolved   Hypermagnesemia- improving. Mg++ 2.8>2.5 Due to AKI Monitor BMP Telemetry   Thrombocytosis - suspect reactive Heme-onc evaluation Follow CBC  AKI- resolved  Body mass index is 36.44 kg/m.  VTE ppx: heparin injection 5,000 Units Start: 08/20/22 2200 SCDs Start: 08/19/22 2224  Diet:     Diet   Diet renal with fluid restriction Fluid restriction: 1200 mL Fluid; Room service appropriate? Yes; Fluid consistency: Thin   Consultants: Heme/onc  Subjective 08/24/22    Pt resting in bed, wife at bedside when seen today.  He is having frequent hiccups despite thorazine given this AM.  He otherwise denies acute complaints.  Appears quite drowsy today.  Wife reports having talked with Dr. FGrayland Ormondabout prognosis earlier this AM and was appreciative of updates.   Objective   Vitals:   08/24/22 0006 08/24/22 0500 08/24/22 0827 08/24/22 1131  BP: 105/61  (!) 153/134 (!) 92/59  Pulse: 83 91 95 93  Resp: '18 18 18 16  '$ Temp: 97.6 F (36.4 C) 98 F (36.7 C) 98.3 F (36.8 C) 98 F (36.7 C)  TempSrc: Oral Oral    SpO2: 100% 100% 98% 97%  Weight:  118.5 kg    Height:        Intake/Output Summary (Last 24 hours) at 08/24/2022 1403 Last data filed at 08/24/2022 [id number]  per 24 hour  Intake 120 ml  Output 1325 ml  Net -1205 ml   Filed Weights   08/21/22 1400 08/23/22 0252 08/24/22 0500  Weight: 117 kg 118.2 kg 118.5 kg     Physical Exam:  General: awake, appears drowsy, NAD, less interactive HEENT: anicteric sclera, MMM, hearing grossly normal Respiratory: normal respiratory effort. CTAB, frequent hiccups Cardiovascular: normal S1/S2, RRR, 1-2+ lower extremity edema b/l Gastrointestinal: obese, non-tender to palpation. Mildly distended Nervous: Drowsy, O x3. no gross focal neurologic  deficits, normal speech Extremities: moves all equally, trace to 1-2+ lower extremity edema Skin: dry, intact, normal temperature Psychiatry: depressed mood, flat affect, intact judgment and insight  Labs   I have personally reviewed the following labs and imaging studies CBC    Component Value Date/Time   WBC 19.6 (H) 08/24/2022 0632   RBC 3.96 (L) 08/24/2022 0632   HGB 9.5 (L) 08/24/2022 0632   HCT 30.0 (L) 08/24/2022 0632   PLT 653 (H) 08/24/2022 0632   MCV 75.8 (L) 08/24/2022 0632   MCH 24.0 (L) 08/24/2022 0632   MCHC 31.7 08/24/2022 0632   RDW 24.4 (H) 08/24/2022 0632   LYMPHSABS 1.5 07/31/2022 0900   MONOABS 1.6 (H) 07/31/2022 0900   EOSABS 0.1 07/31/2022 0900   BASOSABS 0.1 07/31/2022 0900      Latest Ref Rng & Units 08/24/2022    6:32 AM 08/23/2022    3:58 PM 08/23/2022    5:21 AM  BMP  Glucose 70 - 99 mg/dL 89  123  103   BUN 8 - 23 mg/dL 63  62  57   Creatinine 0.61 - 1.24 mg/dL 1.65  1.78  1.51   Sodium 135 - 145 mmol/L 123  125  126   Potassium 3.5 - 5.1 mmol/L 4.7  5.2  5.2   Chloride 98 - 111 mmol/L 94  95  98   CO2 22 - 32 mmol/L '17  20  20   '$ Calcium 8.9 - 10.3 mg/dL 9.0  9.2  9.5     No results found.  Disposition Plan & Communication   Patient status: Inpatient  Admitted From: Home Planned disposition location: SNF recommended  Anticipated discharge date:  pending improvement and stability of electrolytes.   Possible transition to hospice on Monday if not improving.   Family Communication: wife at bedside on rounds 3/6, 3/8   Author: Ezekiel Slocumb, DO [facility] 08/24/2022, 2:03 PM   Available by Epic secure chat 7AM-7PM. If 7PM-7AM, please contact night-coverage.  [facility] contact information found on [email or web address].

## 2022-08-24 NOTE — TOC Progression Note (Signed)
Transition of Care (TOC) - Progression Note    Patient Details  Name: Philip Arnold. MRN: [id number] Date of Birth: 29-Apr-1961  Transition of [facility]) CM/SW Contact  Gerilyn Pilgrim, LCSW Phone Number: 08/24/2022, 9:35 AM  Clinical Narrative:   Pt not ready to discharge EDD unknown due to persistent electrolyte issues. Pt has one bed offer currently at [facility] care when ready to discharge to SNF.          Expected Discharge Plan and Services                                               Social Determinants of Health (SDOH) Interventions SDOH Screenings   Food Insecurity: No Food Insecurity (08/19/2022)  Housing: Low Risk  (08/19/2022)  Transportation Needs: No Transportation Needs (08/19/2022)  Utilities: Not At Risk (08/19/2022)  Tobacco Use: Low Risk  (08/19/2022)    Readmission Risk Interventions     No data to display

## 2022-08-24 NOTE — Progress Notes (Signed)
Occupational Therapy Treatment Patient Details Name: Philip Arnold. MRN: [id number] DOB: Dec 16, 1960 Today's Date: 08/24/2022   History of present illness Pt is a 62 y.o. male with PMH significant for metastatic carcinoma of small bowel, HTN anemia, prostate cancer, obesity, HLD, and hyperglycemia who presented to the ED with generalized weakness, poor appetite as well as development of lower extremity swelling and abdominal distention. 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   OT comments  Philip Arnold is lethargic this afternoon, with electrolyte imbalances and low BP, but he states he would like to sit up on EOB. He states he is tired of lying in bed, and he has visitors present in room; says he can have a better visit with them if he is sitting up and can see them. Pt required Max A for supine<>sit transition, needing assistance with repositioning of b/l LE and for trunk support. Pt required ongoing Mod A trunk support to maintain upright sitting balance. Pt able to maintain sitting balance for ~ 12 minutes before requesting to return to bed. Standing deferred this day, 2/2 BP and pt fatigue. Required Max A +2 for repositioning towards HOB in supine. Will continue to offer OT care in acute setting unless pt/family elects to transition to palliative care approach.    Recommendations for follow up therapy are one component of a multi-disciplinary discharge planning process, led by the attending physician.  Recommendations may be updated based on patient status, additional functional criteria and insurance authorization.    Follow Up Recommendations  Skilled nursing-short term rehab (<3 hours/day)     Assistance Recommended at Discharge Frequent or constant Supervision/Assistance   Patient can return home with the following  A lot of help with bathing/dressing/bathroom;Assistance with cooking/housework;Assist for transportation;Help with stairs or ramp for entrance;A little help with walking and/or transfers   Equipment Recommendations       Recommendations for Other Services      Precautions / Restrictions Precautions Precautions: Fall Restrictions Weight Bearing Restrictions: No       Mobility Bed Mobility Overal bed mobility: Needs Assistance Bed Mobility: Supine to Sit, Sit to Supine     Supine to sit: Max assist Sit to supine: Max assist   General bed mobility comments: Max A for supine<>sit    Transfers                   General transfer comment: not attempted, 2/2 pt fatigue and LBP     Balance Overall balance assessment: Needs assistance Sitting-balance support: Feet supported Sitting balance-[name]: Fair Sitting balance - Comments: Requires Mod A for maintaining sitting balance EOB       Standing balance comment: not attempted this date                           ADL either performed or assessed with clinical judgement   ADL Overall ADL's : Needs assistance/impaired     Grooming: Set up;Sitting;Wash/dry face;Wash/dry hands;Minimal assistance                                 General ADL Comments: anticipate increased assistance for standing ADL tasks    Extremity/Trunk Assessment Upper Extremity Assessment Upper Extremity Assessment: Generalized weakness   Lower Extremity Assessment Lower Extremity Assessment: Generalized weakness  Vision       Perception     Praxis      Cognition Arousal/Alertness: Awake/alert Behavior During Therapy: WFL for tasks assessed/performed, Flat affect Overall Cognitive Status: Within Functional Limits for tasks assessed                                 General Comments: increased time for processing        Exercises       Shoulder Instructions       General Comments      Pertinent Vitals/ Pain       Pain Assessment Faces Pain Scale: Hurts a little bit Pain Location: low back Pain Descriptors / Indicators: Sore Pain Intervention(s): Repositioned  Home Living                                          Prior Functioning/Environment              Frequency  Min 2X/week        Progress Toward Goals  OT Goals(current goals can now be found in the care plan section)  Progress towards OT goals: OT to reassess next treatment  Acute Rehab OT Goals OT Goal Formulation: With patient Time For Goal Achievement: 09/03/22 Potential to Achieve Goals: [place] Discharge plan remains appropriate;Frequency remains appropriate    Co-evaluation                 AM-PAC OT "6 Clicks" Daily Activity     Outcome Measure   Help from another person eating meals?: None Help from another person taking care of personal grooming?: A Little Help from another person toileting, which includes using toliet, bedpan, or urinal?: A Lot Help from another person bathing (including washing, rinsing, drying)?: A Lot Help from another person to put on and taking off regular upper body clothing?: A Little Help from another person to put on and taking off regular lower body clothing?: A Lot 6 Click Score: 16    End of Session Equipment Utilized During Treatment: Rolling walker (2 wheels)  OT Visit Diagnosis: Unsteadiness on feet (R26.81);History of falling (Z91.81);Muscle weakness (generalized) (M62.81)   Activity Tolerance Patient tolerated treatment well;Patient limited by fatigue   Patient Left in bed;with call bell/phone within reach;with bed alarm set   Nurse Communication Mobility status;Other (comment) (may need to have BM soon, per pt report)        Time: [id number] OT Time Calculation (min): 25 min  Charges: OT General Charges $OT Visit: 1 Visit OT Treatments $Self  Care/Home Management : 23-37 mins Josiah Lobo, PhD, MS, OTR/L 08/24/22, 4:01 PM

## 2022-08-24 NOTE — Progress Notes (Signed)
PT Cancellation Note  Patient Details Name: Philip Arnold. MRN: [id number] DOB: August 05, 1960   Cancelled Treatment:     PT attempt. PT hold. Pt currently dealing with hypotension.  Last few BP readings have been low. Pt also endorses not feeling very well. Acute PT will continue to follow as appropriate if plan remains to maximize abilities. Please discontinue PT orders if plan changes to comfort measures.    Willette Pa 08/24/2022, 2:13 PM

## 2022-08-24 NOTE — Progress Notes (Signed)
[facility]  Telephone:([phone] [phone]  ID: Philip Arnold. 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   INTERVAL HISTORY: Patient continues to have persistent weakness and fatigue as well as a decreased performance status.  He has increased back pain which he feels is from being in bed for too long.  He offers no further complaints.  REVIEW OF SYSTEMS:   Review of Systems  Constitutional:  Positive for malaise/fatigue. Negative for fever and weight loss.  Respiratory: Negative.  Negative for cough, hemoptysis and shortness of breath.   Cardiovascular: Negative.  Negative for chest pain and leg swelling.  Gastrointestinal: Negative.  Negative for abdominal pain.  Genitourinary: Negative.  Negative for dysuria.  Musculoskeletal:  Positive for back pain.  Skin: Negative.  Negative for rash.  Neurological:  Positive for weakness. Negative for dizziness, focal weakness and headaches.  Psychiatric/Behavioral: Negative.  The patient is not nervous/anxious.     As per HPI. Otherwise, a complete review of systems is negative.  PAST MEDICAL HISTORY: Past Medical History:  Diagnosis Date   Cancer [facility])    Prostate Cancer   Elevated PSA    GERD (gastroesophageal reflux disease)    Hip pain    History of kidney stones    Hyperglycemia    Hypertension    Obesity     PAST SURGICAL HISTORY: Past Surgical History:  Procedure Laterality Date   COLONOSCOPY WITH PROPOFOL     IR IMAGING GUIDED PORT INSERTION  08/13/2022   PELVIC LYMPH NODE DISSECTION Bilateral 04/24/2019   Procedure: PELVIC LYMPH NODE DISSECTION;  Surgeon: Hollice Espy, MD;  Location: [facility] ORS;  Service: Urology;  Laterality: Bilateral;   ROBOT ASSISTED LAPAROSCOPIC RADICAL  PROSTATECTOMY N/A 04/24/2019   Procedure: XI ROBOTIC ASSISTED LAPAROSCOPIC RADICAL PROSTATECTOMY;  Surgeon: Hollice Espy, MD;  Location: [facility] ORS;  Service: Urology;  Laterality: N/A;    FAMILY HISTORY: Family History  Problem Relation Age of Onset   Bone cancer Mother    Hypertension Father    Stroke Father     ADVANCED DIRECTIVES (Y/N):  '@ADVDIR'$ @  HEALTH MAINTENANCE: Social History   Tobacco Use   Smoking status: Never   Smokeless tobacco: Never  Vaping Use   Vaping Use: Never used  Substance Use Topics   Alcohol use: Not Currently   Drug use: Never     Colonoscopy:  PAP:  Bone density:  Lipid panel:  No Known Allergies  Current Facility-Administered Medications  Medication Dose Route Frequency Provider Last Rate Last Admin   albuterol (PROVENTIL) (2.5 MG/3ML) 0.083% nebulizer solution 2.5 mg  2.5 mg Inhalation Q4H PRN Gertie Fey, MD   2.5 mg at 08/21/22 0804   aspirin EC tablet 81 mg  81 mg Oral Daily Gertie Fey, MD   81 mg at 08/23/22 1001   chlorproMAZINE (THORAZINE) 12.5 mg in sodium chloride 0.9 % 25 mL IVPB  12.5 mg Intravenous Q6H PRN Nicole Kindred A, DO   Stopped at 08/22/22 1559   cholecalciferol (VITAMIN D3) 25 MCG (1000 UNIT) tablet 5,000 Units  5,000 Units Oral Daily Gertie Fey, MD   5,000 Units at 08/23/22 1001   heparin injection 5,000 Units  5,000 Units Subcutaneous Q8H Lorin Picket, RPH   5,000 Units at 08/24/22 0534   lactulose ([place]) 10  GM/15ML solution 10 g  10 g Oral BID Richarda Osmond, MD   10 g at 08/23/22 2153   metoprolol succinate (TOPROL-XL) 24 hr tablet 100 mg  100 mg Oral Daily Gertie Fey, MD   100 mg at 08/23/22 1001   Oral care mouth rinse  15 mL Mouth Rinse PRN Nicole Kindred A, DO       oxyCODONE (Oxy IR/ROXICODONE) immediate release tablet 5 mg  5 mg Oral Q4H PRN Gertie Fey, MD   5 mg at 08/22/22 2037   pantoprazole (PROTONIX) EC tablet 40 mg  40 mg Oral Daily Gertie Fey, MD   40 mg at 08/23/22 1001    patiromer (VELTASSA) packet 8.4 g  8.4 g Oral Daily Nicole Kindred A, DO   8.4 g at 08/23/22 1502   prochlorperazine (COMPAZINE) tablet 10 mg  10 mg Oral Q6H PRN Gertie Fey, MD       simvastatin (ZOCOR) tablet 20 mg  20 mg Oral QHS Gertie Fey, MD   20 mg at 08/23/22 2153   sodium chloride flush (NS) 0.9 % injection 3 mL  3 mL Intravenous Lennart Pall, MD   3 mL at 08/23/22 2154    OBJECTIVE: Vitals:   08/24/22 0500 08/24/22 0827  BP:  (!) 153/134  Pulse: 91 95  Resp: 18 18  Temp: 98 F (36.7 C) 98.3 F (36.8 C)  SpO2: 100% 98%     Body mass index is 36.44 kg/m.    ECOG FS:3 - Symptomatic, >50% confined to bed  General: Well-developed, well-nourished, no acute distress. Eyes: Pink conjunctiva, anicteric sclera. HEENT: Normocephalic, moist mucous membranes. Lungs: No audible wheezing or coughing. Heart: Regular rate and rhythm. Abdomen: Soft, nontender, no obvious distention. Musculoskeletal: No edema, cyanosis, or clubbing. Neuro: Alert, answering all questions appropriately. Cranial nerves grossly intact. Skin: No rashes or petechiae noted. Psych: Normal affect.  LAB RESULTS:  Lab Results  Component Value Date   NA 123 (L) 08/24/2022   K 4.7 08/24/2022   CL 94 (L) 08/24/2022   CO2 17 (L) 08/24/2022   GLUCOSE 89 08/24/2022   BUN 63 (H) 08/24/2022   CREATININE 1.65 (H) 08/24/2022   CALCIUM 9.0 08/24/2022   PROT 5.8 (L) 08/24/2022   ALBUMIN 1.8 (L) 08/24/2022   AST 250 (H) 08/24/2022   ALT 102 (H) 08/24/2022   ALKPHOS 528 (H) 08/24/2022   BILITOT 6.9 (H) 08/24/2022   GFRNONAA 47 (L) 08/24/2022   GFRAA >60 04/25/2019    Lab Results  Component Value Date   WBC 19.6 (H) 08/24/2022   NEUTROABS 13.9 (H) 07/31/2022   HGB 9.5 (L) 08/24/2022   HCT 30.0 (L) 08/24/2022   MCV 75.8 (L) 08/24/2022   PLT 653 (H) 08/24/2022     STUDIES: ECHOCARDIOGRAM COMPLETE  Result Date: 08/20/2022    ECHOCARDIOGRAM REPORT   Patient Name:   Philip Arnold. Date of Exam:  08/20/2022 Medical Rec #:  [id number]            Height:       71.0 in Accession #:    [id number]           Weight:       257.5 lb Date of Birth:  1961/05/20            BSA:          2.348 m Patient Age:    62 years             BP:  117/69 mmHg Patient Gender: M                    HR:           104 bpm. Exam Location:  [facility] Procedure: 2D Echo, Cardiac Doppler and Color Doppler Indications:     CHF-acute systolic [id number]  History:         Patient has no prior history of Echocardiogram examinations.                  Risk Factors:Hypertension.  Sonographer:     Sherrie Sport Referring Phys:  [id number] [facility] GOEL Diagnosing Phys: Kathlyn Sacramento MD  Sonographer Comments: Technically challenging study due to limited acoustic windows, no apical window and no subcostal window. Image acquisition challenging due to respiratory motion. IMPRESSIONS  1. Left ventricular ejection fraction, by estimation, is 55 to 60%. The left ventricle has normal function. Left ventricular endocardial border not optimally defined to evaluate regional wall motion. Left ventricular diastolic function could not be evaluated.  2. Right ventricular systolic function is normal. The right ventricular size is normal. Tricuspid regurgitation signal is inadequate for assessing PA pressure.  3. The mitral valve is normal in structure. No evidence of mitral valve regurgitation. No evidence of mitral stenosis.  4. The aortic valve is normal in structure. Aortic valve regurgitation is not visualized. No aortic stenosis is present.  5. Challenging image quality with only limited views. FINDINGS  Left Ventricle: Left ventricular ejection fraction, by estimation, is 55 to 60%. The left ventricle has normal function. Left ventricular endocardial border not optimally defined to evaluate regional wall motion. The left ventricular internal cavity size was normal in size. There is no left ventricular hypertrophy. Left ventricular diastolic function could not be  evaluated. Right Ventricle: The right ventricular size is normal. No increase in right ventricular wall thickness. Right ventricular systolic function is normal. Tricuspid regurgitation signal is inadequate for assessing PA pressure. Left Atrium: Left atrial size was normal in size. Right Atrium: Right atrial size was normal in size. Pericardium: There is no evidence of pericardial effusion. Mitral Valve: The mitral valve is normal in structure. No evidence of mitral valve regurgitation. No evidence of mitral valve stenosis. Tricuspid Valve: The tricuspid valve is normal in structure. Tricuspid valve regurgitation is not demonstrated. No evidence of tricuspid stenosis. Aortic Valve: The aortic valve is normal in structure. Aortic valve regurgitation is not visualized. No aortic stenosis is present. Pulmonic Valve: The pulmonic valve was normal in structure. Pulmonic valve regurgitation is not visualized. No evidence of pulmonic stenosis. Aorta: The aortic root is normal in size and structure. Venous: The inferior vena cava was not well visualized. IAS/Shunts: No atrial level shunt detected by color flow Doppler.  LEFT VENTRICLE PLAX 2D LVIDd:         3.20 cm LVIDs:         2.30 cm LV PW:         1.30 cm LV IVS:        0.80 cm LVOT diam:     2.10 cm LVOT Area:     3.46 cm  LEFT ATRIUM         Index LA diam:    4.00 cm 1.70 cm/m   AORTA Ao Root diam: 3.30 cm  SHUNTS Systemic Diam: 2.10 cm Kathlyn Sacramento MD Electronically signed by Kathlyn Sacramento MD Signature Date/Time: 08/20/2022/2:23:09 PM    Final    CT Angio Chest PE W and/or Wo  Contrast  Result Date: 08/19/2022 CLINICAL DATA:  Abdominal pain and chest pain. Concern for pulmonary embolism and bowel obstruction. Metastatic adenocarcinoma of small-bowel. EXAM: CT ANGIOGRAPHY CHEST CT ABDOMEN AND PELVIS WITH CONTRAST TECHNIQUE: Multidetector CT imaging of the chest was performed using the standard protocol during bolus administration of intravenous contrast.  Multiplanar CT image reconstructions and MIPs were obtained to evaluate the vascular anatomy. Multidetector CT imaging of the abdomen and pelvis was performed using the standard protocol during bolus administration of intravenous contrast. RADIATION DOSE REDUCTION: This exam was performed according to the departmental dose-optimization program which includes automated exposure control, adjustment of the mA and/or kV according to patient size and/or use of iterative reconstruction technique. CONTRAST:  16m OMNIPAQUE IOHEXOL 350 MG/ML SOLN COMPARISON:  Chest radiograph dated 08/19/2022 and CT dated 08/14/2022. CT abdomen pelvis dated 07/31/2022. FINDINGS: Evaluation of this exam is limited due to respiratory motion artifact. CTA CHEST FINDINGS Cardiovascular: There is no cardiomegaly or pericardial effusion. The thoracic aorta is unremarkable. Evaluation of the pulmonary arteries is limited due to respiratory motion and suboptimal visualization of the peripheral branches. No central pulmonary artery embolus identified. Mediastinum/Nodes: No hilar adenopathy. Mildly rounded subcarinal lymph node measures 8 mm short axis. The esophagus is grossly unremarkable. No mediastinal fluid collection. Right-sided Port-A-Cath with tip at the cavoatrial junction. Lungs/Pleura: Shallow inspiration. There are bibasilar linear and streaky atelectasis. Multiple bilateral pulmonary nodules measuring up to 8 mm in the upper pole right upper lobe in keeping with known metastatic disease. Trace right pleural effusion. No pneumothorax. The central airways are patent. Musculoskeletal: Degenerative changes of the spine. No acute osseous pathology. Review of the MIP images confirms the above findings. CT ABDOMEN and PELVIS FINDINGS No intra-abdominal free air. Small ascites, increased since the prior CT. Hepatobiliary: Multiple (greater than 20) hepatic hypodense metastatic disease similar or progressed since the prior CT. No calcified  gallstone. Pancreas: The pancreas is unremarkable as visualized. Spleen: Normal in size without focal abnormality. Adrenals/Urinary Tract: The adrenal glands unremarkable. There is no hydronephrosis on either side. There is a 7 cm cyst in the upper pole of the left kidney. There is symmetric enhancement and excretion of contrast by both kidneys. The visualized ureters and the urinary bladder is unremarkable. Stomach/Bowel: Ill-defined thickening of the small bowel loop in the mid abdomen as seen on the prior CT in keeping with known malignancy. There is no bowel obstruction. The appendix is normal. Vascular/Lymphatic: Mild aortoiliac atherosclerotic disease. The IVC is unremarkable. No portal venous gas. Periportal adenopathy measure approximately 19 mm in short axis. There is extensive omental nodularity, progressed since the prior CT. An omental mass in the left anterior abdomen measures 8.8 x 3.3 cm. Overall increase in the omental implants since the prior CT. Reproductive: Prostatectomy. Other: Mild subcutaneous edema. Musculoskeletal: Degenerative changes.  No acute osseous pathology. Review of the MIP images confirms the above findings. IMPRESSION: 1. No acute intrathoracic pathology. No central pulmonary artery embolus identified. 2. Multiple bilateral pulmonary nodules in keeping with known metastatic disease and relatively similar to prior CT of 08/14/2022. 3. Progression of metastatic disease in the abdomen and pelvis since the CT of 07/31/2022. There is increased ascites and progression of omental caking. 4. Ill-defined thickening of the small bowel loop in the mid abdomen in keeping with known malignancy. No bowel obstruction. Normal appendix. 5. Hepatic metastatic disease, similar or progressed since the prior CT. 6.  Aortic Atherosclerosis (ICD10-I70.0). Electronically Signed   By: ALaren EvertsD.  On: 08/19/2022 19:05   CT ABDOMEN PELVIS W CONTRAST  Result Date: 08/19/2022 CLINICAL DATA:   Abdominal pain and chest pain. Concern for pulmonary embolism and bowel obstruction. Metastatic adenocarcinoma of small-bowel. EXAM: CT ANGIOGRAPHY CHEST CT ABDOMEN AND PELVIS WITH CONTRAST TECHNIQUE: Multidetector CT imaging of the chest was performed using the standard protocol during bolus administration of intravenous contrast. Multiplanar CT image reconstructions and MIPs were obtained to evaluate the vascular anatomy. Multidetector CT imaging of the abdomen and pelvis was performed using the standard protocol during bolus administration of intravenous contrast. RADIATION DOSE REDUCTION: This exam was performed according to the departmental dose-optimization program which includes automated exposure control, adjustment of the mA and/or kV according to patient size and/or use of iterative reconstruction technique. CONTRAST:  174m OMNIPAQUE IOHEXOL 350 MG/ML SOLN COMPARISON:  Chest radiograph dated 08/19/2022 and CT dated 08/14/2022. CT abdomen pelvis dated 07/31/2022. FINDINGS: Evaluation of this exam is limited due to respiratory motion artifact. CTA CHEST FINDINGS Cardiovascular: There is no cardiomegaly or pericardial effusion. The thoracic aorta is unremarkable. Evaluation of the pulmonary arteries is limited due to respiratory motion and suboptimal visualization of the peripheral branches. No central pulmonary artery embolus identified. Mediastinum/Nodes: No hilar adenopathy. Mildly rounded subcarinal lymph node measures 8 mm short axis. The esophagus is grossly unremarkable. No mediastinal fluid collection. Right-sided Port-A-Cath with tip at the cavoatrial junction. Lungs/Pleura: Shallow inspiration. There are bibasilar linear and streaky atelectasis. Multiple bilateral pulmonary nodules measuring up to 8 mm in the upper pole right upper lobe in keeping with known metastatic disease. Trace right pleural effusion. No pneumothorax. The central airways are patent. Musculoskeletal: Degenerative changes of  the spine. No acute osseous pathology. Review of the MIP images confirms the above findings. CT ABDOMEN and PELVIS FINDINGS No intra-abdominal free air. Small ascites, increased since the prior CT. Hepatobiliary: Multiple (greater than 20) hepatic hypodense metastatic disease similar or progressed since the prior CT. No calcified gallstone. Pancreas: The pancreas is unremarkable as visualized. Spleen: Normal in size without focal abnormality. Adrenals/Urinary Tract: The adrenal glands unremarkable. There is no hydronephrosis on either side. There is a 7 cm cyst in the upper pole of the left kidney. There is symmetric enhancement and excretion of contrast by both kidneys. The visualized ureters and the urinary bladder is unremarkable. Stomach/Bowel: Ill-defined thickening of the small bowel loop in the mid abdomen as seen on the prior CT in keeping with known malignancy. There is no bowel obstruction. The appendix is normal. Vascular/Lymphatic: Mild aortoiliac atherosclerotic disease. The IVC is unremarkable. No portal venous gas. Periportal adenopathy measure approximately 19 mm in short axis. There is extensive omental nodularity, progressed since the prior CT. An omental mass in the left anterior abdomen measures 8.8 x 3.3 cm. Overall increase in the omental implants since the prior CT. Reproductive: Prostatectomy. Other: Mild subcutaneous edema. Musculoskeletal: Degenerative changes.  No acute osseous pathology. Review of the MIP images confirms the above findings. IMPRESSION: 1. No acute intrathoracic pathology. No central pulmonary artery embolus identified. 2. Multiple bilateral pulmonary nodules in keeping with known metastatic disease and relatively similar to prior CT of 08/14/2022. 3. Progression of metastatic disease in the abdomen and pelvis since the CT of 07/31/2022. There is increased ascites and progression of omental caking. 4. Ill-defined thickening of the small bowel loop in the mid abdomen in  keeping with known malignancy. No bowel obstruction. Normal appendix. 5. Hepatic metastatic disease, similar or progressed since the prior CT. 6.  Aortic Atherosclerosis (ICD10-I70.0). Electronically  Signed   By: Anner Crete M.D.   On: 08/19/2022 19:05   US Venous Img Lower Bilateral  Result Date: 08/19/2022 CLINICAL DATA:  BLE edema EXAM: BILATERAL LOWER EXTREMITY VENOUS DOPPLER ULTRASOUND TECHNIQUE: Gray-scale sonography with compression, as well as color and duplex ultrasound, were performed to evaluate the deep venous system(s) from the level of the common femoral vein through the popliteal and proximal calf veins. COMPARISON:  None Available. FINDINGS: VENOUS Normal compressibility of the common femoral, superficial femoral, and popliteal veins, as well as the visualized calf veins. Visualized portions of profunda femoral vein and great saphenous vein unremarkable. No filling defects to suggest DVT on grayscale or color Doppler imaging. Doppler waveforms show normal direction of venous flow, normal respiratory plasticity and response to augmentation. OTHER None. Limitations: none IMPRESSION: Negative. Electronically Signed   By: Valentino Saxon M.D.   On: 08/19/2022 17:49   DG Chest 2 View  Result Date: 08/19/2022 CLINICAL DATA:  History of liver cancer.  Bilateral leg swelling EXAM: CHEST - 2 VIEW COMPARISON:  CT of the chest August 14, 2022 FINDINGS: Bibasilar atelectasis remains. A right Port-A-Cath is in good position. No pneumothorax. The cardiomediastinal silhouette is stable. No other suspicious findings. IMPRESSION: Bibasilar atelectasis. No other acute abnormalities. Electronically Signed   By: Dorise Bullion III M.D.   On: 08/19/2022 16:23   CT Chest W Contrast  Result Date: 08/15/2022 CLINICAL DATA:  Staging exam. 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 * Tracking Code: BO * EXAM: CT CHEST WITH CONTRAST TECHNIQUE: Multidetector CT imaging of the chest was performed during  intravenous contrast administration. RADIATION DOSE REDUCTION: This exam was performed according to the departmental dose-optimization program which includes automated exposure control, adjustment of the mA and/or kV according to patient size and/or use of iterative reconstruction technique. CONTRAST:  88m OMNIPAQUE IOHEXOL 300 MG/ML  SOLN COMPARISON:  None Available. FINDINGS: Cardiovascular: The heart size is normal. No substantial pericardial effusion. Coronary artery calcification is evident. No thoracic aortic aneurysm. Right Port-A-Cath tip is positioned at the SVC/RA junction. Enlargement of the pulmonary outflow tract/main pulmonary arteries suggests pulmonary arterial hypertension. Mediastinum/Nodes: Mild lymphadenopathy noted around the inferior descending thoracic aorta. There is no hilar lymphadenopathy. The esophagus has normal imaging features. There is no axillary lymphadenopathy. Left-sided retrocrural lymphadenopathy evident. Lungs/Pleura: Numerous bilateral pulmonary nodules identified involving all lobes of both lungs. Index right upper lobe pulmonary nodule on image 52/4 measures 8 mm. Index nodule in the left upper lobe measures 8 mm on image 34/4. Left lower lobe index nodule measures 8 mm on image 91/4. There is bilateral lower lobe atelectasis, right greater than left. No dense focal airspace consolidation. No pleural effusion. Upper Abdomen: Multiple hepatic metastases again noted as characterized on recent abdomen/pelvis CT. Index lesion today in the anterior hepatic dome measuring 2.9 cm on image 101/2 was 2.4 cm previously (remeasured). A second lesion in the medial right liver measuring 3.1 cm on image 115/2 today was 2.7 cm previously (remeasured). Perihepatic ascites is progressive in the interval with new Peri splenic ascites evident on today's exam. Musculoskeletal: No worrisome lytic or sclerotic osseous abnormality. IMPRESSION: 1. Numerous bilateral pulmonary nodules involving all  lobes of both lungs measure up to 8 mm in size, consistent with metastatic disease. 2. Mild lymphadenopathy around the inferior descending thoracic aorta and in the left retrocrural space, consistent with metastatic disease. 3. Multiple hepatic metastases as characterized on recent abdomen/pelvis CT. Lesions visible in the upper liver today appear minimally  progressive in the 2 week interval since the prior abdomen/pelvis CT 4. Interval progression of perihepatic ascites with new perisplenic ascites evident on today's exam. 5. Enlargement of the pulmonary outflow tract/main pulmonary arteries suggests pulmonary arterial hypertension. Electronically Signed   By: Misty Stanley M.D.   On: 08/15/2022 07:05   IR IMAGING GUIDED PORT INSERTION  Result Date: 08/13/2022 INDICATION: port placement for chemotherapy EXAM: Chest port placement using ultrasound and fluoroscopic guidance MEDICATIONS: Documented in the EMR ANESTHESIA/SEDATION: Moderate (conscious) sedation was employed during this procedure. A total of Versed 2 mg and Fentanyl 100 mcg was administered intravenously. Moderate Sedation Time: 30 minutes. The patient's level of consciousness and vital signs were monitored continuously by radiology nursing throughout the procedure under my direct supervision. FLUOROSCOPY TIME:  Fluoroscopy Time: 0.9 minutes (8 mGy) COMPLICATIONS: None immediate. PROCEDURE: Informed written consent was obtained from the patient after a thorough discussion of the procedural risks, benefits and alternatives. All questions were addressed. Maximal Sterile Barrier Technique was utilized including caps, mask, sterile gowns, sterile gloves, sterile drape, hand hygiene and skin antiseptic. A timeout was performed prior to the initiation of the procedure. The patient was placed supine on the exam table. The right neck and chest was prepped and draped in the standard sterile fashion. A preliminary ultrasound of the right neck was performed and  demonstrates a patent right internal jugular vein. A permanent ultrasound image was stored in the electronic medical record. The overlying skin was anesthetized with 1% Lidocaine. Using ultrasound guidance, access was obtained into the right internal jugular vein using a 21 gauge micropuncture set. A wire was advanced into the SVC, a short incision was made at the puncture site, and serial dilatation performed. Next, in an ipsilateral infraclavicular location, an incision was made at the site of the subcutaneous reservoir. Blunt dissection was used to open a pocket to contain the reservoir. A subcutaneous tunnel was then created from the port site to the puncture site. A(n) 8 Fr single lumen catheter was advanced through the tunnel. The catheter was attached to the port and this was placed in the subcutaneous pocket. Under fluoroscopic guidance, a peel away sheath was placed, and the catheter was trimmed to the appropriate length and was advanced into the central veins. The catheter length is 23 cm. The tip of the catheter lies near the superior cavoatrial junction. The port flushes and aspirates appropriately. The port was flushed and locked with heparinized saline. The port pocket was closed in 2 layers using 3-0 and 4-0 Vicryl/absorbable suture. Dermabond was also applied to both incisions. The patient tolerated the procedure well and was transferred to recovery in stable condition. IMPRESSION: Successful placement of a right-sided chest port via the right internal jugular vein. The port is ready for immediate use. Electronically Signed   By: Albin Felling M.D.   On: 08/13/2022 16:14   US BIOPSY (LIVER)  Result Date: 08/06/2022 INDICATION: liver mass EXAM: ULTRASOUND GUIDED LIVER MASS BIOPSY COMPARISON:  CT AP, 07/31/2022. MEDICATIONS: None ANESTHESIA/SEDATION: Moderate (conscious) sedation was employed during this procedure. A total of Versed 1 mg and Fentanyl 50 mcg was administered intravenously. Moderate  Sedation Time: 10 minutes. The patient's level of consciousness and vital signs were monitored continuously by radiology nursing throughout the procedure under my direct supervision. COMPLICATIONS: None immediate. PROCEDURE: Informed written consent was obtained from the patient and/or patient's representative after a discussion of the risks, benefits and alternatives to treatment. The patient understands and consents the procedure. A timeout  was performed prior to the initiation of the procedure. Ultrasound scanning was performed of the right upper abdominal quadrant demonstrates multifocal liver masses A LEFT hepatic lobe mass was selected for biopsy and the procedure was planned. The right upper abdominal quadrant was prepped and draped in the usual sterile fashion. The overlying soft tissues were anesthetized with 1% lidocaine with epinephrine. A 17 gauge, 6.8 cm co-axial needle was advanced into a peripheral aspect of the lesion. This was followed by 3 core biopsies with an 18 gauge core device under direct ultrasound guidance. The coaxial needle tract was embolized with a small amount of Gel-Foam slurry and superficial hemostasis was obtained with manual compression. Post procedural scanning was negative for definitive area of hemorrhage or additional complication. A dressing was placed. The patient tolerated the procedure well without immediate post procedural complication. IMPRESSION: Successful ultrasound guided core needle biopsy of liver mass. Michaelle Birks, MD [facility] Electronically Signed   By: Michaelle Birks M.D.   On: 08/06/2022 14:53   CT ABDOMEN PELVIS W CONTRAST  Result Date: 07/31/2022 CLINICAL DATA:  30 pound weight loss. Increased calcium level. Increased liver enzymes. Elevated white blood cell count. Prostatectomy 4 years ago. * Tracking Code: BO * EXAM: CT ABDOMEN AND PELVIS WITH CONTRAST TECHNIQUE: Multidetector CT imaging of  the abdomen and pelvis was performed using the standard protocol following bolus administration of intravenous contrast. RADIATION DOSE REDUCTION: This exam was performed according to the departmental dose-optimization program which includes automated exposure control, adjustment of the mA and/or kV according to patient size and/or use of iterative reconstruction technique. CONTRAST:  173m OMNIPAQUE IOHEXOL 300 MG/ML  SOLN COMPARISON:  05/29/2022 chest radiograph.  Prostate MRI 03/06/2019. FINDINGS: Lower chest: Bibasilar pulmonary nodules. Example right middle lobe 5 mm nodule on 08/04. Normal heart size without pericardial or pleural effusion. Areas of left-sided pleural-based nodularity versus subpleural lymph nodes. Example 8 mm on 06/02. Hepatobiliary: Innumerable, bilateral liver masses consistent with metastasis. Example posterior right hepatic lobe (segment 7) 3.7 x 3.0 cm on 21/2. Central anterior segment 2 mass measures 3.4 x 3.2 cm on 20/2. No calcified gallstone or biliary duct dilatation. Pancreas: Normal, without mass or ductal dilatation. Spleen: Normal in size, without focal abnormality. Adrenals/Urinary Tract: Normal right adrenal gland. Mild left adrenal thickening and nodularity, including at up to 1.0 cm on 25/2. Interpolar left renal 5.7 cm fluid density lesion is likely a cyst . In the absence of clinically indicated signs/symptoms require(s) no independent follow-up. Normal right kidney. No hydronephrosis. Normal urinary bladder. Stomach/Bowel: Proximal gastric underdistention. Scattered colonic diverticula. Normal terminal ileum and appendix. A loop of markedly thickened jejunum is identified on 57/2 and coronal image 57. Vascular/Lymphatic: Aortic atherosclerosis. Porta hepatis adenopathy including a portacaval 1.6 cm node on 36/2. Small bowel mesenteric adenopathy, centered about the thickened small bowel loop. Example nodes at up to 2.6 x 2.4 cm on 55/2. No pelvic sidewall adenopathy.  Reproductive: Prostatectomy, without local recurrence. Other: Small volume abdominopelvic ascites. Extensive peritoneal metastasis. Example left omental implant of 4.2 x 4.5 cm on 47/2. No free intraperitoneal air. Tiny fat containing right inguinal hernia. Small fat containing paraumbilical hernia. Mild right-sided gynecomastia. Musculoskeletal: Presumably degenerative partial fusion of the bilateral sacroiliac joints. Lumbosacral spondylosis. IMPRESSION: 1. Widespread metastatic disease, including to liver, abdominal nodes, peritoneum, and likely the left pleural space. 2. 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 3. Small bibasilar pulmonary  nodules, nonspecific but mildly suspicious for metastatic disease. 4. Small volume abdominopelvic ascites. 5. Prostatectomy, without local recurrence. Distribution of metastatic disease felt unlikely to represent prostate. 6. Indeterminate left adrenal nodule. 7.  Aortic Atherosclerosis (ICD10-I70.0). Electronically Signed   By: Abigail Miyamoto M.D.   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  Given patient's admission to the hospital and declining performance status, patient's treatment is currently delayed.  Unclear if or when patient's performance status will improve enough to initiate chemotherapy.  Hospice was not yet discussed with patient, but he expressed understanding that no chemotherapy was possible unless the strength improves.  Plan to discharge to SNF.  Appreciate palliative care input. Weakness and fatigue: Multifactorial including poor appetite and progressive malignancy.  Appreciate PT and OT input.   Peripheral edema/ascites: Likely secondary to fluid overload and malignancy.  Patient current only under fluid restriction and diuresis as needed. Hyponatremia: Slightly worse today.  Patient's most recent sodium is 123.  Hypercalcemia: Resolved.  Secondary to malignancy.  Patient received 4 mg IV Zometa on August 19, 2022. Leukocytosis: Chronic and unchanged.  Likely reactive, monitor. Anemia: Chronic and unchanged.  Monitor. Thrombocytosis: Mildly improved.  Likely reactive. Transaminitis/hyperbilirubinemia: Likely secondary to progressive disease.  Will follow.  Lloyd Huger, MD   08/24/2022 9:48 AM

## 2022-08-24 NOTE — Consult Note (Signed)
[facility]  CONSULT NOTE    Date: 08/24/2022                  Patient Name:  Philip Arnold.  MRN: [id number]  DOB: Oct 02, 1960  Age / Sex: 62 y.o., male         PCP: [name], MD                 Service Requesting Consult: [facility]                 Reason for Consult: Acute kidney injury            History of Present Illness: Mr. Philip Arnold. is a 62 y.o.  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  Initial note states patient reported weakness and leg swelling for 4 days prior to ED arrival.  Prolonged poor oral intake.  Patient currently being seen by Dr. Grayland Ormond.  Patient ill-appearing, frail.  Wife at bedside.  Remains on room air.  Currently denies nausea or vomiting.  ED testing includes CT abdomen pelvis that shows progression of metastatic disease in the abdomen and pelvis with increased ascites and omental caking.Sodium has ranged from 122-128.  Creatinine mildly elevated from normal baseline.  Liver enzymes elevated.  Medications: Outpatient medications: Medications Prior to Admission  Medication Sig Dispense Refill Last Dose   amLODipine (NORVASC) 10 MG tablet Take 10 mg by mouth daily.    08/19/2022   aspirin EC 81 MG tablet Take 81 mg by mouth daily.    08/19/2022   Cholecalciferol (VITAMIN D3) 125 MCG (5000 UT) CAPS Take 5,000 Units by mouth daily.    08/19/2022   ferrous sulfate 325 (65 FE) MG EC tablet Take 1 tablet by mouth daily with breakfast.   08/19/2022   fluticasone (FLONASE) 50 MCG/ACT nasal spray Place 1 spray into both nostrils  daily as needed for rhinitis or allergies.   unknown   lidocaine-prilocaine (EMLA) cream Apply to affected area once 30 g 3 unknown   losartan (COZAAR) 25 MG tablet Take 1 tablet by mouth daily.   08/19/2022   metoprolol succinate (TOPROL-XL) 100 MG 24 hr tablet Take 100 mg by mouth daily.    08/19/2022   Multiple Vitamin (MULTIVITAMIN WITH MINERALS) TABS tablet Take 1 tablet by mouth daily.   08/19/2022   omeprazole (PRILOSEC OTC) 20 MG tablet Take 20 mg by mouth daily.   08/19/2022   ondansetron (ZOFRAN) 8 MG tablet Take 1 tablet (8 mg total) by mouth every 8 (eight) hours as needed for nausea or vomiting. Start on the third day after chemotherapy. 60 tablet 2 unknown   oxyCODONE (OXY IR/ROXICODONE) 5 MG immediate release tablet Take 1 tablet (5 mg total) by mouth every 4 (four) hours as needed for severe pain. 90 tablet 0 08/19/2022   potassium chloride SA (KLOR-CON M) 20 MEQ tablet Take 20 mEq by mouth 2 (two) times daily.   08/19/2022   prochlorperazine (COMPAZINE) 10 MG tablet Take 1 tablet (10 mg total) by mouth every 6 (six) hours as needed for nausea or vomiting. 60 tablet 2 unknown   simvastatin (ZOCOR) 20  MG tablet Take 20 mg by mouth at bedtime.    08/18/2022   traMADol (ULTRAM) 50 MG tablet Take 50 mg by mouth every 8 (eight) hours as needed for moderate pain.   unknown   triamterene-hydrochlorothiazide (MAXZIDE-25) 37.5-25 MG tablet Take 1 tablet by mouth daily.    08/19/2022   albuterol (VENTOLIN HFA) 108 (90 Base) MCG/ACT inhaler Inhale 1-2 puffs into the lungs every 4 (four) hours as needed for shortness of breath or wheezing. (Patient not taking: Reported on 08/08/2022)   Not Taking   montelukast (SINGULAIR) 10 MG tablet Take 10 mg by mouth daily. (Patient not taking: Reported on 08/20/2022)   Not Taking    Current medications: Current Facility-Administered Medications  Medication Dose Route Frequency Provider Last Rate Last Admin   albuterol (PROVENTIL) (2.5 MG/3ML) 0.083% nebulizer solution 2.5  mg  2.5 mg Inhalation Q4H PRN Gertie Fey, MD   2.5 mg at 08/21/22 0804   aspirin EC tablet 81 mg  81 mg Oral Daily Gertie Fey, MD   81 mg at 08/24/22 1009   baclofen (LIORESAL) tablet 10 mg  10 mg Oral TID PRN Nicole Kindred A, DO       chlorproMAZINE (THORAZINE) 12.5 mg in sodium chloride 0.9 % 25 mL IVPB  12.5 mg Intravenous Q6H PRN Nicole Kindred A, DO 50 mL/hr at 08/24/22 1040 12.5 mg at 08/24/22 1040   cholecalciferol (VITAMIN D3) 25 MCG (1000 UNIT) tablet 5,000 Units  5,000 Units Oral Daily Gertie Fey, MD   5,000 Units at 08/24/22 1008   heparin injection 5,000 Units  5,000 Units Subcutaneous Q8H Lorin Picket, RPH   5,000 Units at 08/24/22 0534   lactulose (CHRONULAC) 10 GM/15ML solution 10 g  10 g Oral BID Richarda Osmond, MD   10 g at 08/24/22 1008   metoprolol succinate (TOPROL-XL) 24 hr tablet 100 mg  100 mg Oral Daily Gertie Fey, MD   100 mg at 08/24/22 1008   Oral care mouth rinse  15 mL Mouth Rinse PRN Nicole Kindred A, DO       oxyCODONE (Oxy IR/ROXICODONE) immediate release tablet 5 mg  5 mg Oral Q4H PRN Gertie Fey, MD   5 mg at 08/22/22 2037   pantoprazole (PROTONIX) EC tablet 40 mg  40 mg Oral Daily Gertie Fey, MD   40 mg at 08/24/22 1008   patiromer (VELTASSA) packet 8.4 g  8.4 g Oral Daily Nicole Kindred A, DO   8.4 g at 08/24/22 1012   prochlorperazine (COMPAZINE) tablet 10 mg  10 mg Oral Q6H PRN Gertie Fey, MD       simvastatin (ZOCOR) tablet 20 mg  20 mg Oral QHS Gertie Fey, MD   20 mg at 08/23/22 2153   sodium chloride flush (NS) 0.9 % injection 3 mL  3 mL Intravenous Q12H Gertie Fey, MD   3 mL at 08/24/22 1012      Allergies: No Known Allergies    Past Medical History: Past Medical History:  Diagnosis Date   Cancer ([place])    Prostate Cancer   Elevated PSA    GERD (gastroesophageal reflux disease)    Hip pain    History of kidney stones    Hyperglycemia    Hypertension    Obesity      Past Surgical History: Past Surgical History:   Procedure Laterality Date   COLONOSCOPY WITH PROPOFOL     IR IMAGING GUIDED PORT INSERTION  08/13/2022   PELVIC LYMPH NODE DISSECTION  Bilateral 04/24/2019   Procedure: PELVIC LYMPH NODE DISSECTION;  Surgeon: Hollice Espy, MD;  Location: [facility] ORS;  Service: Urology;  Laterality: Bilateral;   ROBOT ASSISTED LAPAROSCOPIC RADICAL PROSTATECTOMY N/A 04/24/2019   Procedure: XI ROBOTIC ASSISTED LAPAROSCOPIC RADICAL PROSTATECTOMY;  Surgeon: Hollice Espy, MD;  Location: [facility] ORS;  Service: Urology;  Laterality: N/A;     Family History: Family History  Problem Relation Age of Onset   Bone cancer Mother    Hypertension Father    Stroke Father      Social History: Social History   Socioeconomic History   Marital status: Married    Spouse name: Not on file   Number of children: Not on file   Years of education: Not on file   Highest education level: Not on file  Occupational History   Not on file  Tobacco Use   Smoking status: Never   Smokeless tobacco: Never  Vaping Use   Vaping Use: Never used  Substance and Sexual Activity   Alcohol use: Not Currently   Drug use: Never   Sexual activity: Yes  Other Topics Concern   Not on file  Social History Narrative   Not on file   Social Determinants of Health   Financial Resource Strain: Not on file  Food Insecurity: No Food Insecurity (08/19/2022)   Hunger Vital Sign    Worried About Running Out of Food in the Last Year: Never true    Ran Out of Food in the Last Year: Never true  Transportation Needs: No Transportation Needs (08/19/2022)   PRAPARE - Hydrologist (Medical): No    Lack of Transportation (Non-Medical): No  Physical Activity: Not on file  Stress: Not on file  Social Connections: Not on file  Intimate Partner Violence: Not At Risk (08/19/2022)   Humiliation, Afraid, Rape, and Kick questionnaire    Fear of Current or Ex-Partner: No    Emotionally Abused: No    Physically Abused: No     Sexually Abused: No     Review of Systems: Review of Systems  Constitutional:  Negative for chills, fever and malaise/fatigue.  HENT:  Negative for congestion, sore throat and tinnitus.   Eyes:  Negative for blurred vision and redness.  Respiratory:  Positive for shortness of breath. Negative for cough and wheezing.   Cardiovascular:  Positive for leg swelling. Negative for chest pain, palpitations and claudication.  Gastrointestinal:  Negative for abdominal pain, blood in stool, diarrhea, nausea and vomiting.  Genitourinary:  Negative for flank pain, frequency and hematuria.  Musculoskeletal:  Negative for back pain, falls and myalgias.  Skin:  Negative for rash.  Neurological:  Negative for dizziness, weakness and headaches.  Endo/Heme/Allergies:  Does not bruise/bleed easily.  Psychiatric/Behavioral:  Negative for depression. The patient is not nervous/anxious and does not have insomnia.     Vital Signs: Blood pressure (!) 92/59, pulse 93, temperature 98 F (36.7 C), resp. rate 16, height '5\' 11"'$  (1.803 m), weight 118.5 kg, SpO2 97 %.  Weight trends: Filed Weights   08/21/22 1400 08/23/22 0252 08/24/22 0500  Weight: 117 kg 118.2 kg 118.5 kg    Physical Exam: General: NAD, ill-appearing  Head: Normocephalic, atraumatic.  Dry oral mucosal membranes  Eyes: Anicteric  Lungs:  Clear to auscultation, distended  Heart: Regular rate and rhythm  Abdomen:  Soft, nontender, distended  Extremities:  1+ peripheral edema.  Neurologic: Nonfocal, moving all four extremities  Skin: No lesions  Access: None  Lab results: Basic Metabolic Panel: Recent Labs  Lab 08/19/22 1657 08/19/22 2256 08/20/22 0453 08/21/22 0359 08/23/22 0521 08/23/22 1558 08/24/22 [id number]  NA  --    < > 127*   < > 126* 125* 123*  K  --    < > 5.0   < > 5.2* 5.2* 4.7  CL  --    < > 95*   < > 98 95* 94*  CO2  --    < > 21*   < > 20* 20* 17*  GLUCOSE  --    < > 99   < > 103* 123* 89  BUN  --    < > 38*    < > 57* 62* 63*  CREATININE  --    < > 0.99   < > 1.51* 1.78* 1.65*  CALCIUM  --    < > 10.8*   < > 9.5 9.2 9.0  MG 2.8*  --  2.5*  --  2.6*  --   --   PHOS 3.0  --  2.4*  --   --   --   --    < > = values in this interval not displayed.    Liver Function Tests: Recent Labs  Lab 08/22/22 0453 08/23/22 0521 08/24/22 0632  AST 151* 201* 250*  ALT 70* 89* 102*  ALKPHOS 338* 414* 528*  BILITOT 5.9* 6.7* 6.9*  PROT 5.9* 6.3* 5.8*  ALBUMIN 1.8* 1.9* 1.8*   Recent Labs  Lab 08/19/22 1657  LIPASE 64*   Recent Labs  Lab 08/20/22 1436 08/21/22 0359  AMMONIA 52* 35    CBC: Recent Labs  Lab 08/20/22 0453 08/21/22 0359 08/22/22 0453 08/23/22 0521 08/24/22 0632  WBC 18.0* 21.4* 19.2* 19.0* 19.6*  HGB 10.5* 9.7* 9.4* 9.9* 9.5*  HCT 33.5* 31.1* 29.9* 30.9* 30.0*  MCV 76.0* 76.0* 75.7* 76.1* 75.8*  PLT 761* 691* 711* 719* 653*    Cardiac Enzymes: No results for input(s): "CKTOTAL", "CKMB", "CKMBINDEX", "TROPONINI" in the last 168 hours.  BNP: Invalid input(s): "POCBNP"  CBG: Recent Labs  Lab 08/21/22 0740  GLUCAP 113*    Microbiology: Results for orders placed or performed during the hospital encounter of 08/19/22  Culture, blood (Routine X 2) w Reflex to ID Panel     Status: None   Collection Time: 08/19/22 10:14 PM   Specimen: BLOOD  Result Value Ref Range Status   Specimen Description BLOOD BLOOD LEFT HAND  Final   Special Requests   Final    BOTTLES DRAWN AEROBIC AND ANAEROBIC Blood Culture adequate volume   Culture   Final    NO GROWTH 5 DAYS Performed at [facility], [address] [place], [place] [address]    Report Status 08/24/2022 FINAL  Final  Culture, blood (Routine X 2) w Reflex to ID Panel     Status: None   Collection Time: 08/19/22 10:14 PM   Specimen: BLOOD  Result Value Ref Range Status   Specimen Description BLOOD BLOOD RIGHT HAND  Final   Special Requests   Final    BOTTLES DRAWN AEROBIC AND ANAEROBIC Blood Culture  results may not be optimal due to an inadequate volume of blood received in culture bottles   Culture   Final    NO GROWTH 5 DAYS Performed at [facility], [address], [place], [place] [address]    Report Status 08/24/2022 FINAL  Final    Coagulation Studies: No results for input(s): "LABPROT", "  INR" in the last 72 hours.  Urinalysis: No results for input(s): "COLORURINE", "LABSPEC", "PHURINE", "GLUCOSEU", "HGBUR", "BILIRUBINUR", "KETONESUR", "PROTEINUR", "UROBILINOGEN", "NITRITE", "LEUKOCYTESUR" in the last 72 hours.  Invalid input(s): "APPERANCEUR"    Imaging: No results found.   Assessment & Plan: Mr. Philip Arnold. is a 63 y.o.  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  May have a hepatorenal component.  Normal renal function noted on admission.  Continue to avoid nephrotoxic agents and therapies if possible.  Discussed with patient and spouse that offering and providing dialysis would not change overall prognosis.  Recommend continue conversations with palliative care determining goals.  2.  Hyponatremia, likely secondary to poor oral intake.  Initially improved with IV fluids.  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  Given IV furosemide 20 mg yesterday.  Recommend holding further diuresis and IV fluids for now.  3. Anemia of chronic disease:  Lab Results  Component Value Date   HGB 9.5 (L) 08/24/2022    Hemoglobin remains within optimal range.  Will continue to monitor for now.   LOS: [place] 3/8/20241:33 PM

## 2022-08-24 NOTE — Progress Notes (Addendum)
[place] at [facility] [phone] [phone]) [phone]   Name: Philip Arnold. Date: 08/24/2022 MRN: [id number]  DOB: 06/08/1961  Patient Care Team: [name], MD as PCP - General (Internal Medicine) Clent Jacks, RN as Oncology Nurse Navigator    REASON FOR CONSULTATION: Philip Suniga. is a 62 y.o. 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  Plan to initiate chemotherapy with FOLFOX plus Bev but was admitted to the hospital on 08/19/2022 with progressive weakness, metabolic encephalopathy, and anasarca.  Palliative care was consulted to address goals.    CODE STATUS: DNR  PAST MEDICAL HISTORY: Past Medical History:  Diagnosis Date   Cancer ([place])    Prostate Cancer   Elevated PSA    GERD (gastroesophageal reflux disease)    Hip pain    History of kidney stones    Hyperglycemia    Hypertension    Obesity     PAST SURGICAL HISTORY:  Past Surgical History:  Procedure Laterality Date   COLONOSCOPY WITH PROPOFOL     IR IMAGING GUIDED PORT INSERTION  08/13/2022   PELVIC LYMPH NODE DISSECTION Bilateral 04/24/2019   Procedure: PELVIC LYMPH NODE DISSECTION;  Surgeon: Hollice Espy, MD;  Location: [facility] ORS;  Service: Urology;  Laterality: Bilateral;   ROBOT ASSISTED LAPAROSCOPIC RADICAL PROSTATECTOMY N/A 04/24/2019   Procedure: XI ROBOTIC ASSISTED LAPAROSCOPIC RADICAL PROSTATECTOMY;  Surgeon: Hollice Espy, MD;  Location: [facility] ORS;  Service: Urology;  Laterality: N/A;    HEMATOLOGY/ONCOLOGY HISTORY:  Oncology History  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[place])  07/31/2022 Initial Diagnosis   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[place])   08/08/2022 Cancer Staging   Staging form: Small Intestine - Adenocarcinoma, AJCC 8th Edition - Clinical stage from 08/08/2022: Stage IV (cTX, cN2, pM1) - Signed by Lloyd Huger, MD on 08/08/2022 Stage prefix: Initial diagnosis    08/27/2022 -  Chemotherapy   Patient is on Treatment Plan : COLORECTAL FOLFOX + Bevacizumab q14d       ALLERGIES:  has No Known Allergies.  MEDICATIONS:  Current Facility-Administered Medications  Medication Dose Route Frequency Provider Last Rate Last Admin   albuterol (PROVENTIL) (2.5 MG/3ML) 0.083% nebulizer solution 2.5 mg  2.5 mg Inhalation Q4H PRN Gertie Fey, MD   2.5 mg at 08/21/22 0804   aspirin EC tablet 81 mg  81 mg Oral Daily Gertie Fey, MD   81 mg at 08/24/22 1009   baclofen (LIORESAL) tablet 10 mg  10 mg Oral TID PRN Nicole Kindred A, DO       chlorproMAZINE (THORAZINE) 12.5 mg in sodium chloride 0.9 % 25 mL IVPB  12.5 mg Intravenous Q6H PRN Nicole Kindred A, DO 50 mL/hr at 08/24/22 1040 12.5 mg at 08/24/22 1040   cholecalciferol (VITAMIN D3) 25 MCG (1000 UNIT) tablet 5,000 Units  5,000 Units Oral Daily Gertie Fey, MD   5,000 Units at 08/24/22 1008   heparin injection 5,000 Units  5,000 Units Subcutaneous Q8H Lorin Picket, RPH   5,000 Units at 08/24/22 0534   lactulose (CHRONULAC) 10 GM/15ML solution 10 g  10 g Oral BID Richarda Osmond, MD   10 g at 08/24/22 1008   metoprolol succinate (TOPROL-XL) 24 hr tablet 100 mg  100 mg Oral Daily Gertie Fey, MD   100 mg at 08/24/22 1008   Oral care mouth rinse  15 mL Mouth Rinse PRN Ezekiel Slocumb, DO  oxyCODONE (Oxy IR/ROXICODONE) immediate release tablet 5 mg  5 mg Oral Q4H PRN Gertie Fey, MD   5 mg at 08/22/22 2037   pantoprazole (PROTONIX) EC tablet 40 mg  40 mg Oral Daily Gertie Fey, MD   40 mg at 08/24/22 1008   patiromer (VELTASSA) packet 8.4 g  8.4 g Oral Daily Nicole Kindred A, DO   8.4 g at 08/24/22 1012   prochlorperazine (COMPAZINE) tablet 10 mg  10 mg Oral Q6H PRN Gertie Fey, MD       simvastatin (ZOCOR) tablet 20 mg  20 mg Oral QHS Gertie Fey, MD   20 mg at 08/23/22 2153   sodium chloride flush (NS) 0.9 % injection 3 mL  3 mL Intravenous Q12H Gertie Fey, MD   3 mL at 08/24/22 1012    VITAL  SIGNS: BP (!) 92/59 (BP Location: Right Arm)   Pulse 93   Temp 98 F (36.7 C)   Resp 16   Ht '5\' 11"'$  (1.803 m)   Wt 261 lb 3.9 oz (118.5 kg)   SpO2 97%   BMI 36.44 kg/m  Filed Weights   08/21/22 1400 08/23/22 0252 08/24/22 0500  Weight: 257 lb 15 oz (117 kg) 260 lb 9.3 oz (118.2 kg) 261 lb 3.9 oz (118.5 kg)    Estimated body mass index is 36.44 kg/m as calculated from the following:   Height as of this encounter: '5\' 11"'$  (1.803 m).   Weight as of this encounter: 261 lb 3.9 oz (118.5 kg).  LABS: CBC:    Component Value Date/Time   WBC 19.6 (H) 08/24/2022 0632   HGB 9.5 (L) 08/24/2022 0632   HCT 30.0 (L) 08/24/2022 0632   PLT 653 (H) 08/24/2022 0632   MCV 75.8 (L) 08/24/2022 0632   NEUTROABS 13.9 (H) 07/31/2022 0900   LYMPHSABS 1.5 07/31/2022 0900   MONOABS 1.6 (H) 07/31/2022 0900   EOSABS 0.1 07/31/2022 0900   BASOSABS 0.1 07/31/2022 0900   Comprehensive Metabolic Panel:    Component Value Date/Time   NA 123 (L) 08/24/2022 0632   K 4.7 08/24/2022 0632   CL 94 (L) 08/24/2022 0632   CO2 17 (L) 08/24/2022 0632   BUN 63 (H) 08/24/2022 0632   CREATININE 1.65 (H) 08/24/2022 0632   GLUCOSE 89 08/24/2022 0632   CALCIUM 9.0 08/24/2022 0632   AST 250 (H) 08/24/2022 0632   ALT 102 (H) 08/24/2022 0632   ALKPHOS 528 (H) 08/24/2022 0632   BILITOT 6.9 (H) 08/24/2022 0632   PROT 5.8 (L) 08/24/2022 0632   ALBUMIN 1.8 (L) 08/24/2022 0632    RADIOGRAPHIC STUDIES: ECHOCARDIOGRAM COMPLETE  Result Date: 08/20/2022    ECHOCARDIOGRAM REPORT   Patient Name:   Philip Arnold. Date of Exam: 08/20/2022 Medical Rec #:  [id number]            Height:       71.0 in Accession #:    [id number]           Weight:       257.5 lb Date of Birth:  07-23-1960            BSA:          2.348 m Patient Age:    44 years             BP:           117/69 mmHg Patient Gender: M  HR:           104 bpm. Exam Location:  [facility] Procedure: 2D Echo, Cardiac Doppler and Color Doppler Indications:      CHF-acute systolic [id number]  History:         Patient has no prior history of Echocardiogram examinations.                  Risk Factors:Hypertension.  Sonographer:     Sherrie Sport Referring Phys:  [id number] [facility] GOEL Diagnosing Phys: Kathlyn Sacramento MD  Sonographer Comments: Technically challenging study due to limited acoustic windows, no apical window and no subcostal window. Image acquisition challenging due to respiratory motion. IMPRESSIONS  1. Left ventricular ejection fraction, by estimation, is 55 to 60%. The left ventricle has normal function. Left ventricular endocardial border not optimally defined to evaluate regional wall motion. Left ventricular diastolic function could not be evaluated.  2. Right ventricular systolic function is normal. The right ventricular size is normal. Tricuspid regurgitation signal is inadequate for assessing PA pressure.  3. The mitral valve is normal in structure. No evidence of mitral valve regurgitation. No evidence of mitral stenosis.  4. The aortic valve is normal in structure. Aortic valve regurgitation is not visualized. No aortic stenosis is present.  5. Challenging image quality with only limited views. FINDINGS  Left Ventricle: Left ventricular ejection fraction, by estimation, is 55 to 60%. The left ventricle has normal function. Left ventricular endocardial border not optimally defined to evaluate regional wall motion. The left ventricular internal cavity size was normal in size. There is no left ventricular hypertrophy. Left ventricular diastolic function could not be evaluated. Right Ventricle: The right ventricular size is normal. No increase in right ventricular wall thickness. Right ventricular systolic function is normal. Tricuspid regurgitation signal is inadequate for assessing PA pressure. Left Atrium: Left atrial size was normal in size. Right Atrium: Right atrial size was normal in size. Pericardium: There is no evidence of pericardial effusion. Mitral Valve:  The mitral valve is normal in structure. No evidence of mitral valve regurgitation. No evidence of mitral valve stenosis. Tricuspid Valve: The tricuspid valve is normal in structure. Tricuspid valve regurgitation is not demonstrated. No evidence of tricuspid stenosis. Aortic Valve: The aortic valve is normal in structure. Aortic valve regurgitation is not visualized. No aortic stenosis is present. Pulmonic Valve: The pulmonic valve was normal in structure. Pulmonic valve regurgitation is not visualized. No evidence of pulmonic stenosis. Aorta: The aortic root is normal in size and structure. Venous: The inferior vena cava was not well visualized. IAS/Shunts: No atrial level shunt detected by color flow Doppler.  LEFT VENTRICLE PLAX 2D LVIDd:         3.20 cm LVIDs:         2.30 cm LV PW:         1.30 cm LV IVS:        0.80 cm LVOT diam:     2.10 cm LVOT Area:     3.46 cm  LEFT ATRIUM         Index LA diam:    4.00 cm 1.70 cm/m   AORTA Ao Root diam: 3.30 cm  SHUNTS Systemic Diam: 2.10 cm Kathlyn Sacramento MD Electronically signed by Kathlyn Sacramento MD Signature Date/Time: 08/20/2022/2:23:09 PM    Final    CT Angio Chest PE W and/or Wo Contrast  Result Date: 08/19/2022 CLINICAL DATA:  Abdominal pain and chest pain. Concern for pulmonary embolism and bowel obstruction. Metastatic adenocarcinoma of  small-bowel. EXAM: CT ANGIOGRAPHY CHEST CT ABDOMEN AND PELVIS WITH CONTRAST TECHNIQUE: Multidetector CT imaging of the chest was performed using the standard protocol during bolus administration of intravenous contrast. Multiplanar CT image reconstructions and MIPs were obtained to evaluate the vascular anatomy. Multidetector CT imaging of the abdomen and pelvis was performed using the standard protocol during bolus administration of intravenous contrast. RADIATION DOSE REDUCTION: This exam was performed according to the departmental dose-optimization program which includes automated exposure control, adjustment of the mA  and/or kV according to patient size and/or use of iterative reconstruction technique. CONTRAST:  176m OMNIPAQUE IOHEXOL 350 MG/ML SOLN COMPARISON:  Chest radiograph dated 08/19/2022 and CT dated 08/14/2022. CT abdomen pelvis dated 07/31/2022. FINDINGS: Evaluation of this exam is limited due to respiratory motion artifact. CTA CHEST FINDINGS Cardiovascular: There is no cardiomegaly or pericardial effusion. The thoracic aorta is unremarkable. Evaluation of the pulmonary arteries is limited due to respiratory motion and suboptimal visualization of the peripheral branches. No central pulmonary artery embolus identified. Mediastinum/Nodes: No hilar adenopathy. Mildly rounded subcarinal lymph node measures 8 mm short axis. The esophagus is grossly unremarkable. No mediastinal fluid collection. Right-sided Port-A-Cath with tip at the cavoatrial junction. Lungs/Pleura: Shallow inspiration. There are bibasilar linear and streaky atelectasis. Multiple bilateral pulmonary nodules measuring up to 8 mm in the upper pole right upper lobe in keeping with known metastatic disease. Trace right pleural effusion. No pneumothorax. The central airways are patent. Musculoskeletal: Degenerative changes of the spine. No acute osseous pathology. Review of the MIP images confirms the above findings. CT ABDOMEN and PELVIS FINDINGS No intra-abdominal free air. Small ascites, increased since the prior CT. Hepatobiliary: Multiple (greater than 20) hepatic hypodense metastatic disease similar or progressed since the prior CT. No calcified gallstone. Pancreas: The pancreas is unremarkable as visualized. Spleen: Normal in size without focal abnormality. Adrenals/Urinary Tract: The adrenal glands unremarkable. There is no hydronephrosis on either side. There is a 7 cm cyst in the upper pole of the left kidney. There is symmetric enhancement and excretion of contrast by both kidneys. The visualized ureters and the urinary bladder is unremarkable.  Stomach/Bowel: Ill-defined thickening of the small bowel loop in the mid abdomen as seen on the prior CT in keeping with known malignancy. There is no bowel obstruction. The appendix is normal. Vascular/Lymphatic: Mild aortoiliac atherosclerotic disease. The IVC is unremarkable. No portal venous gas. Periportal adenopathy measure approximately 19 mm in short axis. There is extensive omental nodularity, progressed since the prior CT. An omental mass in the left anterior abdomen measures 8.8 x 3.3 cm. Overall increase in the omental implants since the prior CT. Reproductive: Prostatectomy. Other: Mild subcutaneous edema. Musculoskeletal: Degenerative changes.  No acute osseous pathology. Review of the MIP images confirms the above findings. IMPRESSION: 1. No acute intrathoracic pathology. No central pulmonary artery embolus identified. 2. Multiple bilateral pulmonary nodules in keeping with known metastatic disease and relatively similar to prior CT of 08/14/2022. 3. Progression of metastatic disease in the abdomen and pelvis since the CT of 07/31/2022. There is increased ascites and progression of omental caking. 4. Ill-defined thickening of the small bowel loop in the mid abdomen in keeping with known malignancy. No bowel obstruction. Normal appendix. 5. Hepatic metastatic disease, similar or progressed since the prior CT. 6.  Aortic Atherosclerosis (ICD10-I70.0). Electronically Signed   By: AAnner CreteM.D.   On: 08/19/2022 19:05   CT ABDOMEN PELVIS W CONTRAST  Result Date: 08/19/2022 CLINICAL DATA:  Abdominal pain and chest pain. Concern  for pulmonary embolism and bowel obstruction. Metastatic adenocarcinoma of small-bowel. EXAM: CT ANGIOGRAPHY CHEST CT ABDOMEN AND PELVIS WITH CONTRAST TECHNIQUE: Multidetector CT imaging of the chest was performed using the standard protocol during bolus administration of intravenous contrast. Multiplanar CT image reconstructions and MIPs were obtained to evaluate the  vascular anatomy. Multidetector CT imaging of the abdomen and pelvis was performed using the standard protocol during bolus administration of intravenous contrast. RADIATION DOSE REDUCTION: This exam was performed according to the departmental dose-optimization program which includes automated exposure control, adjustment of the mA and/or kV according to patient size and/or use of iterative reconstruction technique. CONTRAST:  160m OMNIPAQUE IOHEXOL 350 MG/ML SOLN COMPARISON:  Chest radiograph dated 08/19/2022 and CT dated 08/14/2022. CT abdomen pelvis dated 07/31/2022. FINDINGS: Evaluation of this exam is limited due to respiratory motion artifact. CTA CHEST FINDINGS Cardiovascular: There is no cardiomegaly or pericardial effusion. The thoracic aorta is unremarkable. Evaluation of the pulmonary arteries is limited due to respiratory motion and suboptimal visualization of the peripheral branches. No central pulmonary artery embolus identified. Mediastinum/Nodes: No hilar adenopathy. Mildly rounded subcarinal lymph node measures 8 mm short axis. The esophagus is grossly unremarkable. No mediastinal fluid collection. Right-sided Port-A-Cath with tip at the cavoatrial junction. Lungs/Pleura: Shallow inspiration. There are bibasilar linear and streaky atelectasis. Multiple bilateral pulmonary nodules measuring up to 8 mm in the upper pole right upper lobe in keeping with known metastatic disease. Trace right pleural effusion. No pneumothorax. The central airways are patent. Musculoskeletal: Degenerative changes of the spine. No acute osseous pathology. Review of the MIP images confirms the above findings. CT ABDOMEN and PELVIS FINDINGS No intra-abdominal free air. Small ascites, increased since the prior CT. Hepatobiliary: Multiple (greater than 20) hepatic hypodense metastatic disease similar or progressed since the prior CT. No calcified gallstone. Pancreas: The pancreas is unremarkable as visualized. Spleen: Normal  in size without focal abnormality. Adrenals/Urinary Tract: The adrenal glands unremarkable. There is no hydronephrosis on either side. There is a 7 cm cyst in the upper pole of the left kidney. There is symmetric enhancement and excretion of contrast by both kidneys. The visualized ureters and the urinary bladder is unremarkable. Stomach/Bowel: Ill-defined thickening of the small bowel loop in the mid abdomen as seen on the prior CT in keeping with known malignancy. There is no bowel obstruction. The appendix is normal. Vascular/Lymphatic: Mild aortoiliac atherosclerotic disease. The IVC is unremarkable. No portal venous gas. Periportal adenopathy measure approximately 19 mm in short axis. There is extensive omental nodularity, progressed since the prior CT. An omental mass in the left anterior abdomen measures 8.8 x 3.3 cm. Overall increase in the omental implants since the prior CT. Reproductive: Prostatectomy. Other: Mild subcutaneous edema. Musculoskeletal: Degenerative changes.  No acute osseous pathology. Review of the MIP images confirms the above findings. IMPRESSION: 1. No acute intrathoracic pathology. No central pulmonary artery embolus identified. 2. Multiple bilateral pulmonary nodules in keeping with known metastatic disease and relatively similar to prior CT of 08/14/2022. 3. Progression of metastatic disease in the abdomen and pelvis since the CT of 07/31/2022. There is increased ascites and progression of omental caking. 4. Ill-defined thickening of the small bowel loop in the mid abdomen in keeping with known malignancy. No bowel obstruction. Normal appendix. 5. Hepatic metastatic disease, similar or progressed since the prior CT. 6.  Aortic Atherosclerosis (ICD10-I70.0). Electronically Signed   By: AAnner CreteM.D.   On: 08/19/2022 19:05   UKoreaVenous Img Lower Bilateral  Result Date: 08/19/2022  CLINICAL DATA:  BLE edema EXAM: BILATERAL LOWER EXTREMITY VENOUS DOPPLER ULTRASOUND TECHNIQUE:  Gray-scale sonography with compression, as well as color and duplex ultrasound, were performed to evaluate the deep venous system(s) from the level of the common femoral vein through the popliteal and proximal calf veins. COMPARISON:  None Available. FINDINGS: VENOUS Normal compressibility of the common femoral, superficial femoral, and popliteal veins, as well as the visualized calf veins. Visualized portions of profunda femoral vein and great saphenous vein unremarkable. No filling defects to suggest DVT on grayscale or color Doppler imaging. Doppler waveforms show normal direction of venous flow, normal respiratory plasticity and response to augmentation. OTHER None. Limitations: none IMPRESSION: Negative. Electronically Signed   By: Valentino Saxon M.D.   On: 08/19/2022 17:49   DG Chest 2 View  Result Date: 08/19/2022 CLINICAL DATA:  History of liver cancer.  Bilateral leg swelling EXAM: CHEST - 2 VIEW COMPARISON:  CT of the chest August 14, 2022 FINDINGS: Bibasilar atelectasis remains. A right Port-A-Cath is in good position. No pneumothorax. The cardiomediastinal silhouette is stable. No other suspicious findings. IMPRESSION: Bibasilar atelectasis. No other acute abnormalities. Electronically Signed   By: Dorise Bullion III M.D.   On: 08/19/2022 16:23   CT Chest W Contrast  Result Date: 08/15/2022 CLINICAL DATA:  Staging exam. 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 * Tracking Code: BO * EXAM: CT CHEST WITH CONTRAST TECHNIQUE: Multidetector CT imaging of the chest was performed during intravenous contrast administration. RADIATION DOSE REDUCTION: This exam was performed according to the departmental dose-optimization program which includes automated exposure control, adjustment of the mA and/or kV according to patient size and/or use of iterative reconstruction technique. CONTRAST:  16m OMNIPAQUE IOHEXOL 300 MG/ML  SOLN COMPARISON:  None Available. FINDINGS: Cardiovascular: The heart size is  normal. No substantial pericardial effusion. Coronary artery calcification is evident. No thoracic aortic aneurysm. Right Port-A-Cath tip is positioned at the SVC/RA junction. Enlargement of the pulmonary outflow tract/main pulmonary arteries suggests pulmonary arterial hypertension. Mediastinum/Nodes: Mild lymphadenopathy noted around the inferior descending thoracic aorta. There is no hilar lymphadenopathy. The esophagus has normal imaging features. There is no axillary lymphadenopathy. Left-sided retrocrural lymphadenopathy evident. Lungs/Pleura: Numerous bilateral pulmonary nodules identified involving all lobes of both lungs. Index right upper lobe pulmonary nodule on image 52/4 measures 8 mm. Index nodule in the left upper lobe measures 8 mm on image 34/4. Left lower lobe index nodule measures 8 mm on image 91/4. There is bilateral lower lobe atelectasis, right greater than left. No dense focal airspace consolidation. No pleural effusion. Upper Abdomen: Multiple hepatic metastases again noted as characterized on recent abdomen/pelvis CT. Index lesion today in the anterior hepatic dome measuring 2.9 cm on image 101/2 was 2.4 cm previously (remeasured). A second lesion in the medial right liver measuring 3.1 cm on image 115/2 today was 2.7 cm previously (remeasured). Perihepatic ascites is progressive in the interval with new Peri splenic ascites evident on today's exam. Musculoskeletal: No worrisome lytic or sclerotic osseous abnormality. IMPRESSION: 1. Numerous bilateral pulmonary nodules involving all lobes of both lungs measure up to 8 mm in size, consistent with metastatic disease. 2. Mild lymphadenopathy around the inferior descending thoracic aorta and in the left retrocrural space, consistent with metastatic disease. 3. Multiple hepatic metastases as characterized on recent abdomen/pelvis CT. Lesions visible in the upper liver today appear minimally progressive in the 2 week interval since the prior  abdomen/pelvis CT 4. Interval progression of perihepatic ascites with new perisplenic ascites evident on  today's exam. 5. Enlargement of the pulmonary outflow tract/main pulmonary arteries suggests pulmonary arterial hypertension. Electronically Signed   By: Misty Stanley M.D.   On: 08/15/2022 07:05   IR IMAGING GUIDED PORT INSERTION  Result Date: 08/13/2022 INDICATION: port placement for chemotherapy EXAM: Chest port placement using ultrasound and fluoroscopic guidance MEDICATIONS: Documented in the EMR ANESTHESIA/SEDATION: Moderate (conscious) sedation was employed during this procedure. A total of Versed 2 mg and Fentanyl 100 mcg was administered intravenously. Moderate Sedation Time: 30 minutes. The patient's level of consciousness and vital signs were monitored continuously by radiology nursing throughout the procedure under my direct supervision. FLUOROSCOPY TIME:  Fluoroscopy Time: 0.9 minutes (8 mGy) COMPLICATIONS: None immediate. PROCEDURE: Informed written consent was obtained from the patient after a thorough discussion of the procedural risks, benefits and alternatives. All questions were addressed. Maximal Sterile Barrier Technique was utilized including caps, mask, sterile gowns, sterile gloves, sterile drape, hand hygiene and skin antiseptic. A timeout was performed prior to the initiation of the procedure. The patient was placed supine on the exam table. The right neck and chest was prepped and draped in the standard sterile fashion. A preliminary ultrasound of the right neck was performed and demonstrates a patent right internal jugular vein. A permanent ultrasound image was stored in the electronic medical record. The overlying skin was anesthetized with 1% Lidocaine. Using ultrasound guidance, access was obtained into the right internal jugular vein using a 21 gauge micropuncture set. A wire was advanced into the SVC, a short incision was made at the puncture site, and serial dilatation  performed. Next, in an ipsilateral infraclavicular location, an incision was made at the site of the subcutaneous reservoir. Blunt dissection was used to open a pocket to contain the reservoir. A subcutaneous tunnel was then created from the port site to the puncture site. A(n) 8 Fr single lumen catheter was advanced through the tunnel. The catheter was attached to the port and this was placed in the subcutaneous pocket. Under fluoroscopic guidance, a peel away sheath was placed, and the catheter was trimmed to the appropriate length and was advanced into the central veins. The catheter length is 23 cm. The tip of the catheter lies near the superior cavoatrial junction. The port flushes and aspirates appropriately. The port was flushed and locked with heparinized saline. The port pocket was closed in 2 layers using 3-0 and 4-0 Vicryl/absorbable suture. Dermabond was also applied to both incisions. The patient tolerated the procedure well and was transferred to recovery in stable condition. IMPRESSION: Successful placement of a right-sided chest port via the right internal jugular vein. The port is ready for immediate use. Electronically Signed   By: Albin Felling M.D.   On: 08/13/2022 16:14   US BIOPSY (LIVER)  Result Date: 08/06/2022 INDICATION: liver mass EXAM: ULTRASOUND GUIDED LIVER MASS BIOPSY COMPARISON:  CT AP, 07/31/2022. MEDICATIONS: None ANESTHESIA/SEDATION: Moderate (conscious) sedation was employed during this procedure. A total of Versed 1 mg and Fentanyl 50 mcg was administered intravenously. Moderate Sedation Time: 10 minutes. The patient's level of consciousness and vital signs were monitored continuously by radiology nursing throughout the procedure under my direct supervision. COMPLICATIONS: None immediate. PROCEDURE: Informed written consent was obtained from the patient and/or patient's representative after a discussion of the risks, benefits and alternatives to treatment. The patient  understands and consents the procedure. A timeout was performed prior to the initiation of the procedure. Ultrasound scanning was performed of the right upper abdominal quadrant demonstrates multifocal liver masses  A LEFT hepatic lobe mass was selected for biopsy and the procedure was planned. The right upper abdominal quadrant was prepped and draped in the usual sterile fashion. The overlying soft tissues were anesthetized with 1% lidocaine with epinephrine. A 17 gauge, 6.8 cm co-axial needle was advanced into a peripheral aspect of the lesion. This was followed by 3 core biopsies with an 18 gauge core device under direct ultrasound guidance. The coaxial needle tract was embolized with a small amount of Gel-Foam slurry and superficial hemostasis was obtained with manual compression. Post procedural scanning was negative for definitive area of hemorrhage or additional complication. A dressing was placed. The patient tolerated the procedure well without immediate post procedural complication. IMPRESSION: Successful ultrasound guided core needle biopsy of liver mass. Michaelle Birks, MD [facility] Electronically Signed   By: Michaelle Birks M.D.   On: 08/06/2022 14:53   CT ABDOMEN PELVIS W CONTRAST  Result Date: 07/31/2022 CLINICAL DATA:  30 pound weight loss. Increased calcium level. Increased liver enzymes. Elevated white blood cell count. Prostatectomy 4 years ago. * Tracking Code: BO * EXAM: CT ABDOMEN AND PELVIS WITH CONTRAST TECHNIQUE: Multidetector CT imaging of the abdomen and pelvis was performed using the standard protocol following bolus administration of intravenous contrast. RADIATION DOSE REDUCTION: This exam was performed according to the departmental dose-optimization program which includes automated exposure control, adjustment of the mA and/or kV according to patient size and/or use of iterative reconstruction technique. CONTRAST:  144m  OMNIPAQUE IOHEXOL 300 MG/ML  SOLN COMPARISON:  05/29/2022 chest radiograph.  Prostate MRI 03/06/2019. FINDINGS: Lower chest: Bibasilar pulmonary nodules. Example right middle lobe 5 mm nodule on 08/04. Normal heart size without pericardial or pleural effusion. Areas of left-sided pleural-based nodularity versus subpleural lymph nodes. Example 8 mm on 06/02. Hepatobiliary: Innumerable, bilateral liver masses consistent with metastasis. Example posterior right hepatic lobe (segment 7) 3.7 x 3.0 cm on 21/2. Central anterior segment 2 mass measures 3.4 x 3.2 cm on 20/2. No calcified gallstone or biliary duct dilatation. Pancreas: Normal, without mass or ductal dilatation. Spleen: Normal in size, without focal abnormality. Adrenals/Urinary Tract: Normal right adrenal gland. Mild left adrenal thickening and nodularity, including at up to 1.0 cm on 25/2. Interpolar left renal 5.7 cm fluid density lesion is likely a cyst . In the absence of clinically indicated signs/symptoms require(s) no independent follow-up. Normal right kidney. No hydronephrosis. Normal urinary bladder. Stomach/Bowel: Proximal gastric underdistention. Scattered colonic diverticula. Normal terminal ileum and appendix. A loop of markedly thickened jejunum is identified on 57/2 and coronal image 57. Vascular/Lymphatic: Aortic atherosclerosis. Porta hepatis adenopathy including a portacaval 1.6 cm node on 36/2. Small bowel mesenteric adenopathy, centered about the thickened small bowel loop. Example nodes at up to 2.6 x 2.4 cm on 55/2. No pelvic sidewall adenopathy. Reproductive: Prostatectomy, without local recurrence. Other: Small volume abdominopelvic ascites. Extensive peritoneal metastasis. Example left omental implant of 4.2 x 4.5 cm on 47/2. No free intraperitoneal air. Tiny fat containing right inguinal hernia. Small fat containing paraumbilical hernia. Mild right-sided gynecomastia. Musculoskeletal: Presumably degenerative partial fusion of the  bilateral sacroiliac joints. Lumbosacral spondylosis. IMPRESSION: 1. Widespread metastatic disease, including to liver, abdominal nodes, peritoneum, and likely the left pleural space. 2. 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 3. Small bibasilar pulmonary nodules, nonspecific but mildly suspicious for metastatic disease. 4. Small volume abdominopelvic ascites. 5. Prostatectomy, without local recurrence. Distribution of metastatic disease felt  unlikely to represent prostate. 6. Indeterminate left adrenal nodule. 7.  Aortic Atherosclerosis (ICD10-I70.0). Electronically Signed   By: Abigail Miyamoto M.D.   On: 07/31/2022 11:38    PERFORMANCE STATUS (ECOG) : 3 - Symptomatic, >50% confined to bed  Review of Systems Unless otherwise noted, a complete review of systems is negative.  Physical Exam General: NAD Cardiovascular: regular rate and rhythm Pulmonary: clear ant fields Abdomen: soft, nontender, + bowel sounds GU: no suprapubic tenderness Extremities: no edema, no joint deformities Skin: no rashes Neurological: Weakness but otherwise nonfocal  IMPRESSION: Follow-up visit.  Unfortunately, patient has rising transaminases and hyperbilirubinemia.  Multiple metabolic derangements.  I met with patient and wife.  Both recognize that he is doing poorly.  Patient told wife earlier today that he "was not sure that any of this is worth it."  At this point, he is not felt to be candidate for cancer treatment unless he significantly improved.  We did discuss the possible option of hospice involvement and a transition to comfort care.  His wife would like to wait the weekend to see how he does and is not interested in making any decisions while the son is home from college this weekend.  We discussed CODE STATUS.  Both patient and wife verbalized that they would not want him to "suffer" at end-of-life and subsequently would  not be interested in life-prolonging measures or resuscitation.  Both were in agreement with DNR/DNI.  PLAN: -Continue current scope of treatment -DNR/DNI -Will follow  Case and plan discussed with Dr. Grayland Ormond   Time Total: 30 minutes  Visit consisted of counseling and education dealing with the complex and emotionally intense issues of symptom management and palliative care in the setting of serious and potentially life-threatening illness.Greater than 50%  of this time was spent counseling and coordinating care related to the above assessment and plan.  Signed by: Altha Harm, PhD, NP-C

## 2022-08-24 NOTE — TOC Progression Note (Signed)
Transition of Care (TOC) - Progression Note    Patient Details  Name: Philip Arnold. MRN: [id number] Date of Birth: 05/19/1961  Transition of [facility]) CM/SW Contact  Gerilyn Pilgrim, LCSW Phone Number: 08/24/2022, 1:19 PM  Clinical Narrative:   Wife would like to proceed with hospice care either inpatient or home hospice. She would like team to follow back up on Monday to discuss details regarding this.          Expected Discharge Plan and Services                                               Social Determinants of Health (SDOH) Interventions SDOH Screenings   Food Insecurity: No Food Insecurity (08/19/2022)  Housing: Low Risk  (08/19/2022)  Transportation Needs: No Transportation Needs (08/19/2022)  Utilities: Not At Risk (08/19/2022)  Tobacco Use: Low Risk  (08/19/2022)    Readmission Risk Interventions     No data to display

## 2022-08-25 DIAGNOSIS — C7889 Secondary malignant neoplasm of other digestive organs: Secondary | ICD-10-CM | POA: Diagnosis not present

## 2022-08-25 DIAGNOSIS — Z515 Encounter for palliative care: Secondary | ICD-10-CM

## 2022-08-25 DIAGNOSIS — N179 Acute kidney failure, unspecified: Secondary | ICD-10-CM | POA: Diagnosis not present

## 2022-08-25 DIAGNOSIS — E871 Hypo-osmolality and hyponatremia: Secondary | ICD-10-CM | POA: Diagnosis not present

## 2022-08-25 DIAGNOSIS — R601 Generalized edema: Secondary | ICD-10-CM | POA: Diagnosis not present

## 2022-08-25 LAB — COMPREHENSIVE METABOLIC PANEL
ALT: 118 U/L — ABNORMAL HIGH (ref 0–44)
AST: 253 U/L — ABNORMAL HIGH (ref 15–41)
Albumin: 1.8 g/dL — ABNORMAL LOW (ref 3.5–5.0)
Alkaline Phosphatase: 561 U/L — ABNORMAL HIGH (ref 38–126)
Anion gap: 11 (ref 5–15)
BUN: 71 mg/dL — ABNORMAL HIGH (ref 8–23)
CO2: 17 mmol/L — ABNORMAL LOW (ref 22–32)
Calcium: 8.8 mg/dL — ABNORMAL LOW (ref 8.9–10.3)
Chloride: 94 mmol/L — ABNORMAL LOW (ref 98–111)
Creatinine, Ser: 2.57 mg/dL — ABNORMAL HIGH (ref 0.61–1.24)
GFR, Estimated: 27 mL/min — ABNORMAL LOW (ref >60)
Glucose, Bld: 106 mg/dL — ABNORMAL HIGH (ref 70–99)
Potassium: 5 mmol/L (ref 3.5–5.1)
Sodium: 122 mmol/L — ABNORMAL LOW (ref 135–145)
Total Bilirubin: 7.4 mg/dL — ABNORMAL HIGH (ref 0.3–1.2)
Total Protein: 6.2 g/dL — ABNORMAL LOW (ref 6.5–8.1)

## 2022-08-25 LAB — CBC
HCT: 31 % — ABNORMAL LOW (ref 39.0–52.0)
Hemoglobin: 9.8 g/dL — ABNORMAL LOW (ref 13.0–17.0)
MCH: 23.8 pg — ABNORMAL LOW (ref 26.0–34.0)
MCHC: 31.6 g/dL (ref 30.0–36.0)
MCV: 75.2 fL — ABNORMAL LOW (ref 80.0–100.0)
Platelets: 687 10*3/uL — ABNORMAL HIGH (ref 150–400)
RBC: 4.12 MIL/uL — ABNORMAL LOW (ref 4.22–5.81)
RDW: 24 % — ABNORMAL HIGH (ref 11.5–15.5)
WBC: 25 10*3/uL — ABNORMAL HIGH (ref 4.0–10.5)
nRBC: 0 % (ref 0.0–0.2)

## 2022-08-25 MED ORDER — ORAL CARE MOUTH RINSE: 15.0000 mL | OROMUCOSAL | 0 refills | Status: DC | PRN

## 2022-08-25 MED ORDER — ALBUTEROL SULFATE (2.5 MG/3ML) 0.083% IN NEBU: 2.5000 mg | INHALATION_SOLUTION | RESPIRATORY_TRACT | 12 refills | Status: DC | PRN

## 2022-08-25 MED ORDER — MORPHINE SULFATE (CONCENTRATE) 10 MG/0.5ML PO SOLN
5.0000 mg | ORAL | Status: DC | PRN
  Filled 2022-08-25: qty 0.5

## 2022-08-25 MED ORDER — BIOTENE DRY MOUTH MT LIQD: 15.0000 mL | OROMUCOSAL | Status: DC | PRN

## 2022-08-25 MED ORDER — HALOPERIDOL 0.5 MG PO TABS: 0.5000 mg | ORAL_TABLET | ORAL | Status: DC | PRN

## 2022-08-25 MED ORDER — DIPHENHYDRAMINE HCL 50 MG/ML IJ SOLN: 12.5000 mg | INTRAMUSCULAR | 0 refills | Status: DC | PRN

## 2022-08-25 MED ORDER — GLYCOPYRROLATE 1 MG PO TABS: 1.0000 mg | ORAL_TABLET | ORAL | Status: DC | PRN

## 2022-08-25 MED ORDER — BISACODYL 10 MG RE SUPP: 10.0000 mg | Freq: Every day | RECTAL | Status: DC | PRN

## 2022-08-25 MED ORDER — ONDANSETRON HCL 4 MG/2ML IJ SOLN
4.0000 mg | Freq: Four times a day (QID) | INTRAMUSCULAR | Status: DC | PRN
  Administered 2022-08-25 (×2): 4 mg via INTRAVENOUS
  Filled 2022-08-25 (×2): qty 2

## 2022-08-25 MED ORDER — BISACODYL 10 MG RE SUPP: 10.0000 mg | Freq: Every day | RECTAL | 0 refills | Status: DC | PRN

## 2022-08-25 MED ORDER — ONDANSETRON 4 MG PO TBDP: 4.0000 mg | ORAL_TABLET | Freq: Four times a day (QID) | ORAL | 0 refills | Status: DC | PRN

## 2022-08-25 MED ORDER — GLYCOPYRROLATE 0.2 MG/ML IJ SOLN: 0.2000 mg | INTRAMUSCULAR | Status: DC | PRN

## 2022-08-25 MED ORDER — MORPHINE SULFATE (CONCENTRATE) 10 MG/0.5ML PO SOLN: 5.0000 mg | ORAL | 0 refills | Status: DC | PRN

## 2022-08-25 MED ORDER — ONDANSETRON 4 MG PO TBDP: 4.0000 mg | ORAL_TABLET | Freq: Four times a day (QID) | ORAL | Status: DC | PRN

## 2022-08-25 MED ORDER — POLYVINYL ALCOHOL 1.4 % OP SOLN: 1.0000 [drp] | Freq: Four times a day (QID) | OPHTHALMIC | Status: DC | PRN

## 2022-08-25 MED ORDER — HALOPERIDOL LACTATE 5 MG/ML IJ SOLN
0.5000 mg | INTRAMUSCULAR | Status: DC | PRN
  Administered 2022-08-25: 0.5 mg via INTRAVENOUS
  Filled 2022-08-25: qty 1

## 2022-08-25 MED ORDER — DIAZEPAM 5 MG/ML IJ SOLN: 5.0000 mg | Freq: Four times a day (QID) | INTRAMUSCULAR | Status: DC | PRN

## 2022-08-25 MED ORDER — HALOPERIDOL LACTATE 2 MG/ML PO CONC: 0.5000 mg | ORAL | Status: DC | PRN

## 2022-08-25 MED ORDER — MORPHINE SULFATE (CONCENTRATE) 10 MG/0.5ML PO SOLN
5.0000 mg | ORAL | Status: DC | PRN
  Administered 2022-08-25 (×2): 5 mg via SUBLINGUAL
  Filled 2022-08-25: qty 0.5

## 2022-08-25 MED ORDER — SODIUM CHLORIDE 0.9 % IV SOLN: 12.5000 mg | Freq: Four times a day (QID) | INTRAVENOUS | Status: DC | PRN

## 2022-08-25 MED ORDER — FLEET ENEMA 7-19 GM/118ML RE ENEM: 1.0000 | ENEMA | Freq: Every day | RECTAL | Status: DC | PRN

## 2022-08-25 MED ORDER — BACLOFEN 1 MG/ML ORAL SUSPENSION: 10.0000 mg | Freq: Three times a day (TID) | ORAL | Status: DC

## 2022-08-25 MED ORDER — DIPHENHYDRAMINE HCL 50 MG/ML IJ SOLN: 12.5000 mg | INTRAMUSCULAR | Status: DC | PRN

## 2022-08-25 MED ORDER — ACETAMINOPHEN 325 MG PO TABS: 650.0000 mg | ORAL_TABLET | Freq: Four times a day (QID) | ORAL | Status: DC | PRN

## 2022-08-25 MED ORDER — ACETAMINOPHEN 650 MG RE SUPP: 650.0000 mg | Freq: Four times a day (QID) | RECTAL | Status: DC | PRN

## 2022-08-25 MED ORDER — BACLOFEN 1 MG/ML ORAL SUSPENSION
10.0000 mg | Freq: Three times a day (TID) | ORAL | Status: DC
  Filled 2022-08-25 (×3): qty 10

## 2022-08-25 MED ORDER — ALBUTEROL SULFATE (2.5 MG/3ML) 0.083% IN NEBU: 2.5000 mg | INHALATION_SOLUTION | RESPIRATORY_TRACT | Status: DC | PRN

## 2022-08-25 MED ORDER — FLEET ENEMA 7-19 GM/118ML RE ENEM: 1.0000 | ENEMA | Freq: Every day | RECTAL | 0 refills | Status: DC | PRN

## 2022-08-25 MED ORDER — DIAZEPAM 5 MG/ML IJ SOLN
5.0000 mg | Freq: Four times a day (QID) | INTRAMUSCULAR | Status: DC | PRN
  Administered 2022-08-25: 5 mg via INTRAVENOUS
  Filled 2022-08-25 (×2): qty 2

## 2022-08-25 MED ORDER — POLYVINYL ALCOHOL 1.4 % OP SOLN: 1.0000 [drp] | Freq: Four times a day (QID) | OPHTHALMIC | 0 refills | Status: DC | PRN

## 2022-08-25 MED ORDER — PANTOPRAZOLE SODIUM 40 MG PO TBEC: 40.0000 mg | DELAYED_RELEASE_TABLET | Freq: Every day | ORAL | Status: DC

## 2022-08-25 NOTE — Progress Notes (Signed)
PROGRESS NOTE  Kelby Aline.    DOB: 1961/02/05, 62 y.o.  [id number]    Code Status: DNR   DOA: 08/19/2022   LOS: 6   Brief hospital course  Deuntae Heft. is a 62 y.o. male with a PMH significant for metastatic carcinoma of small bowel, HTN anemia, prostate cancer, obesity, HLD, .  They presented from home to the ED on 08/19/2022 with generalized weakness and worsened swelling x 4 days. Also endorses poor appetite.  Had liver biopsy 08/06/22 which showed metastatic adenocarcinoma. He was due to start therapy outpatient at cancer center 3/4. Dr. Grayland Ormond aware of admission.   In the ED, it was found that they had stable vital signs ORA. He did later develop some mild hypoxia and required 3L [place].  Significant findings included WBC 19.9, hgb 10.7, platelets 811, PT/INR 16.8/1.4troponin neg, Na+ 124, K+ 5.6, Cl- 91, glucose 122, Cr 1.31, albumin 2.1, AST/ALT 138/64, alk phos 372, total bili 5.7, BNP 41.5, lipase 64, Mg+ 2.8. Blood cultures were collected. Chest xray: bibasilar atelectasis LE doppler: negative DVT CTA chest: showed known metastatic nodules without PE CT abdomen pelvis significant findings: Progression of metastatic disease in the abdomen and pelvis since the CT of 07/31/2022. There is increased ascites and progression of omental caking.  Ill-defined thickening of the small bowel loop in the mid abdomen in keeping with known malignancy. No bowel obstruction.  Hepatic metastatic disease, similar or progressed since the prior CT.  They were initially treated with CTX, flagyl. Heme/onc consulted.    Patient was admitted to medicine service for further workup and management of generalized weakness as outlined in detail below.  Hospital course complicated by persistent electrolyte derangements, worsening hepatic and renal function and ongoing poor PO intake.  Has clinically declined signicantly before and during this admission.  Patient was seen by PT/OT and SNF  recommended for rehab at discharge.   Oncology holding off initiation of chemotherapy pending improvement in pt's functional / performance status.  3/9 AM -- transitioned to comfort care.     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  Late this AM, notified by RN that patient and family wish to proceed with comfort measures. I returned to bedside to discuss and they confirmed.  Wife, son and patient are in agreement. 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  Plan was to start outpatient treatment 3/4 but was admitted for progressive generalized weakness. Overall, prognosis is poor. Chemotherapy now delayed due to declining functional status. LFT's steadily increasing since admission, due to metastatic liver involvement Now worsening renal failure and persistent/worsening hyponatremia Patient has been rapidly declining. --Oncology and palliative are consulted, appreciate your assistance --Transtitioned to comfort measures this AM.  Generalized weakness  mild metabolic encephalopathy- in relation to poor nutritional status and progressed disease.  Suspicion for possible hepatic encephalopathy. Ammonia was mildly elevated, improved.   Anasarca- due to metastatic disease,  hypoalbuminemia Renal function will not tolerate diuresis No comfort care  Acute kidney injury - Cr has continued to rise over several days. Nephrology was consulted. Now comfort care No further labs  Hyponatremia- Hypervolemic.  acute on chronic.  Due to anasarca, volume overload.  Moderate and possible mild symptomatic contributing to his overall weakness, fatigue, mild confusion.  Na level 3 months ago was mildly low at 131. Hyponatremia has been refractory.  Diuresis worsened renal function.  Briefly improved with fluids but worsened his volume status.  Possibly SIADH component. Nephrology was constuled Now comfort care - no further labs   Hyperkalemia- d/c Veltassa and telemetry.    Acute hypoxic respiratory failure- resolved.  O2 for comfort if needed  Hypercalcemia- resolved. Due to malignancy.   Leukocytosis- acute reaction. no overt signs of infection.    TSH elevation- free T4 mildly elevated 1.19.   Hypermagnesemia-Due to AKI   Thrombocytosis - suspect reactive  Obesity Body mass index is 36.13 kg/m. Complicates overall care and prognosis.   VTE ppx: None - comfort care   Diet:     Diet   Diet regular Room service appropriate? Yes; Fluid consistency: Thin   Consultants: Oncology Palliative Care Nephrology  Subjective 08/25/22    Pt resting in bed, cousin at bedside this AM on rounds.  Pt having intractable hiccups but had not been given medication yet this AM.  He felt baclofen more helpful yesterday than thorazine.  He otherwise reports intermittent nausea, no vomiting.  Denies significant abdominal discomfort and trouble breathing other than hiccups.    Later this AM, RN notified me patient and family want to transition to comfort care at this time.  I return to bedside and confirmed this with wife, son and patient.    Objective   Vitals:   08/25/22 0010 08/25/22 0403 08/25/22 0429 08/25/22 0834  BP: 100/70 102/76  105/64  Pulse: 86 88  91   Resp: '20 18  16  '$ Temp: 97.9 F (36.6 C) 97.6 F (36.4 C)  98.1 F (36.7 C)  TempSrc: Oral Oral    SpO2: 96% 95%  97%  Weight:   117.5 kg   Height:        Intake/Output Summary (Last 24 hours) at 08/25/2022 1124 Last data filed at 08/25/2022 0428 Gross per 24 hour  Intake 480 ml  Output 300 ml  Net 180 ml   Filed Weights   08/23/22 0252 08/24/22 0500 08/25/22 0429  Weight: 118.2 kg 118.5 kg 117.5 kg     Physical Exam:  General: very drowsy appearing but awake, NAD, less interactive HEENT: anicteric sclera, MMM, hearing grossly normal Respiratory: tachypneic with frequent hiccups, normal respiratory effort. CTAB but diminished due to shallow inspirations Cardiovascular: RRR, 2+ lower extremity edema b/l has increased Gastrointestinal: more distended today, non-tender  Nervous: Drowsy, O x3. no gross focal neurologic deficits, normal speech Extremities: moves all equally, 2+ lower extremity edema increased Skin: dry, intact, normal temperature Psychiatry: depressed mood, flat affect, intact judgment and insight  Labs   I have personally reviewed the following labs and imaging studies CBC    Component Value Date/Time   WBC 25.0 (H) 08/25/2022 0558   RBC 4.12 (L) 08/25/2022 0558   HGB 9.8 (L) 08/25/2022 0558   HCT 31.0 (L) 08/25/2022 0558   PLT 687 (H) 08/25/2022 0558   MCV 75.2 (L) 08/25/2022 [id number]  MCH 23.8 (L) 08/25/2022 0558   MCHC 31.6 08/25/2022 0558   RDW 24.0 (H) 08/25/2022 0558   LYMPHSABS 1.5 07/31/2022 0900   MONOABS 1.6 (H) 07/31/2022 0900   EOSABS 0.1 07/31/2022 0900   BASOSABS 0.1 07/31/2022 0900      Latest Ref Rng & Units 08/25/2022    5:58 AM 08/24/2022    6:32 AM 08/23/2022    3:58 PM  BMP  Glucose 70 - 99 mg/dL 106  89  123   BUN 8 - 23 mg/dL 71  63  62   Creatinine 0.61 - 1.24 mg/dL 2.57  1.65  1.78   Sodium 135 - 145 mmol/L 122  123  125   Potassium 3.5 - 5.1 mmol/L 5.0  4.7  5.2   Chloride 98 - 111 mmol/L 94  94  95   CO2 22 - 32 mmol/L '17  17   20   '$ Calcium 8.9 - 10.3 mg/dL 8.8  9.0  9.2     No results found.  Disposition Plan & Communication   Patient status: Inpatient  Admitted From: Home Planned disposition location: anticipate d/c to hospice vs in hospital death  Anticipated discharge date:  unknown - pt on end of life care, possible transfer to inpatient hospice if approved and hemodynamically stable   Family Communication: cousin at bedside on rounds this AM.  wife and son at bedside later this AM.   Author: Ezekiel Slocumb, DO [facility] 08/25/2022, 11:24 AM   Available by Epic secure chat 7AM-7PM. If 7PM-7AM, please contact night-coverage.  [facility] contact information found on [email or web address].

## 2022-08-25 NOTE — Progress Notes (Signed)
Per Dr Lanora Manis family have decided on hospice and comfort care measures. Notified Dr. Arbutus Ped of the above.

## 2022-08-25 NOTE — Progress Notes (Signed)
Patient is being discharged to hospice home. Report given to Otila Kluver, hospice RN. AVS placed in the discharge packet for receiving facility. Awaiting EMS.

## 2022-08-25 NOTE — Progress Notes (Signed)
Patient relative asking for medication for agitation. Patient found tossing and tossing. Med given per [facility] EMS here patient left on stretcher. D/c summary given to EMS by secretary.

## 2022-08-25 NOTE — IPAL (Addendum)
  Interdisciplinary Goals of Care Family Meeting   Date carried out: 08/25/2022  Location of the meeting: Bedside  Member's involved: Physician and Family Member or next of kin Dr. Arbutus Ped, Patient's wife and son  Durable Power of Attorney or Loss adjuster, chartered: Wife    Discussion: We discussed goals of care for Philip Arnold. .  Patient and family have decided to transition to comfort care today, rather than wait over the weekend.  Patient has progressive hepatic and renal failure, persistent and worsening electrolyte derangements refractory to correction to this point.  Given overall poor prognosis in even the best of circumstances, they desire patient be comfortable and be started on end of life care.  Code status:   Code Status: DNR   Disposition: In-patient comfort care  Time spent for the meeting: 20 minutes    Philip Slocumb, DO  08/25/2022, 11:16 AM

## 2022-08-25 NOTE — Progress Notes (Signed)
[facility]  PROGRESS NOTE   Subjective:   Patient is lethargic but arousable.  Entire family is at bedside.  Objective:  Vital signs: Blood pressure 105/64, pulse 91, temperature 98.1 F (36.7 C), resp. rate 16, height '5\' 11"'$  (1.803 m), weight 117.5 kg, SpO2 97 %.  Intake/Output Summary (Last 24 hours) at 08/25/2022 1121 Last data filed at 08/25/2022 0428 Gross per 24 hour  Intake 480 ml  Output 300 ml  Net 180 ml   Filed Weights   08/23/22 0252 08/24/22 0500 08/25/22 0429  Weight: 118.2 kg 118.5 kg 117.5 kg     Physical Exam: General:  No acute distress  Head:  Normocephalic, atraumatic. Moist oral mucosal membranes  Eyes:  Anicteric  Neck:  Supple  Lungs:   Clear to auscultation, normal effort  Heart:  S1S2 no rubs  Abdomen:   Soft, nontender, bowel sounds present  Extremities:  peripheral edema.  Neurologic:  Awake, alert, following commands  Skin:  No lesions  Access:     Basic Metabolic Panel: Recent Labs  Lab 08/19/22 1657 08/19/22 2256 08/20/22 0453 08/21/22 0359 08/22/22 0453 08/22/22 1131 08/23/22 0521 08/23/22 1558 08/24/22 0632 08/25/22 0558  NA  --    < > 127*   < > 128* 124* 126* 125* 123* 122*  K  --    < > 5.0   < > 5.1  --  5.2* 5.2* 4.7 5.0  CL  --    < > 95*   < > 99  --  98 95* 94* 94*  CO2  --    < > 21*   < > 21*  --  20* 20* 17* 17*  GLUCOSE  --    < > 99   < > 100*  --  103* 123* 89 106*  BUN  --    < > 38*   < > 49*  --  57* 62* 63* 71*  CREATININE  --    < > 0.99   < > 1.22  --  1.51* 1.78* 1.65* 2.57*  CALCIUM  --    < > 10.8*   < > 9.8  --  9.5 9.2 9.0 8.8*  MG 2.8*  --  2.5*  --   --   --  2.6*  --   --   --   PHOS 3.0  --  2.4*  --   --   --   --   --   --   --    < > = values in this interval not displayed.   GFR: Estimated Creatinine Clearance: 38.9 mL/min (A) (by C-G formula based on SCr of 2.57 mg/dL (H)).  Liver Function Tests: Recent Labs  Lab 08/21/22 0359 08/22/22 0453 08/23/22 0521 08/24/22 0632  08/25/22 0558  AST 124* 151* 201* 250* 253*  ALT 64* 70* 89* 102* 118*  ALKPHOS 284* 338* 414* 528* 561*  BILITOT 5.0* 5.9* 6.7* 6.9* 7.4*  PROT 6.4* 5.9* 6.3* 5.8* 6.2*  ALBUMIN 1.9* 1.8* 1.9* 1.8* 1.8*   Recent Labs  Lab 08/19/22 1657  LIPASE 64*   Recent Labs  Lab 08/20/22 1436 08/21/22 0359  AMMONIA 52* 35    CBC: Recent Labs  Lab 08/21/22 0359 08/22/22 0453 08/23/22 0521 08/24/22 0632 08/25/22 0558  WBC 21.4* 19.2* 19.0* 19.6* 25.0*  HGB 9.7* 9.4* 9.9* 9.5* 9.8*  HCT 31.1* 29.9* 30.9* 30.0* 31.0*  MCV 76.0* 75.7* 76.1* 75.8* 75.2*  PLT 691* 711* 719* 653*  687*     HbA1C: No results found for: "HGBA1C"  Urinalysis: No results for input(s): "COLORURINE", "LABSPEC", "PHURINE", "GLUCOSEU", "HGBUR", "BILIRUBINUR", "KETONESUR", "PROTEINUR", "UROBILINOGEN", "NITRITE", "LEUKOCYTESUR" in the last 72 hours.  Invalid input(s): "APPERANCEUR"    Imaging: No results found.   Medications:    chlorproMAZINE (THORAZINE) 12.5 mg in sodium chloride 0.9 % 25 mL IVPB 12.5 mg (08/24/22 1040)    baclofen  10 mg Oral TID   pantoprazole  40 mg Oral Daily   sodium chloride flush  3 mL Intravenous Q12H    Assessment/ Plan:     Philip Arnold. is a 62 y.o.  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  He has a history of metastatic liver cancer.  Hyponatremia can also be explained secondary to hepatorenal syndrome.  Spoke to the entire family at bedside.  They want the patient to be on comfort care/hospice from today.  Family does not want any dialysis  interventions.  Explained to patient's wife and son at bedside in detail.  Nursing staff and primary doctor notified. Overall prognosis is poor.   LOS: [place], [facility] 3/9/202411:21 AM

## 2022-08-25 NOTE — Assessment & Plan Note (Signed)
As of today, 08/25/2022.  Late this AM, notified by RN that patient and family wish to proceed with comfort measures. I returned to bedside to discuss and they confirmed.  Wife, son and patient are in agreement. --Comfort measures placed --Notify provider if signs of pain, discomfort, distress etc despite administering meds ordered --TOC notified to reach out to hospice liaison regarding consideration for inpatient hospice

## 2022-08-25 NOTE — Discharge Summary (Signed)
Physician Discharge Summary   Patient: Philip Arnold. 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 [facility] Course:  Philip Arnold. is a 62 y.o. male with a PMH significant for metastatic carcinoma of small bowel, HTN anemia, prostate cancer, obesity, HLD, .   They presented from home to the ED on 08/19/2022 with generalized weakness and worsened swelling x 4 days. Also endorses poor appetite.  Had liver biopsy 08/06/22 which showed metastatic adenocarcinoma. He was due to start therapy outpatient at cancer center 3/4. Dr. Grayland Ormond aware of admission.    In the ED, it was found that they had stable vital signs ORA. He did later develop some mild hypoxia and required 3L [place].  Significant findings included WBC 19.9, hgb 10.7, platelets 811, PT/INR 16.8/1.4troponin neg, Na+ 124, K+ 5.6, Cl- 91, glucose 122, Cr 1.31, albumin 2.1, AST/ALT 138/64, alk phos 372, total bili 5.7, BNP 41.5, lipase 64, Mg+ 2.8. Blood cultures were collected. Chest xray: bibasilar atelectasis LE doppler: negative DVT CTA chest: showed known metastatic nodules without PE CT abdomen pelvis significant findings: Progression of metastatic disease in the abdomen and pelvis since the CT of 07/31/2022. There is increased ascites and progression of omental caking.   Ill-defined thickening of the small bowel loop in the mid abdomen in keeping with known malignancy. No bowel obstruction.  Hepatic metastatic disease, similar or progressed since the prior CT.   They were initially treated with CTX, flagyl. Heme/onc consulted.     Patient was admitted to medicine service for further workup and management of generalized weakness as outlined in detail below.   Hospital course complicated by persistent electrolyte derangements, worsening hepatic and renal function and ongoing poor PO intake.  Has clinically declined signicantly before and during this admission.  Patient was seen by PT/OT and SNF recommended for rehab at discharge.   Oncology holding off initiation of chemotherapy pending improvement in pt's functional / performance status.   3/9 AM -- transitioned to comfort care.   3/9 PM -- Notified this afternoon patient has been accepted to inpatient hospice and they can accept today.  He is stable for discharge and transport to hospice facility.    Assessment and Plan: * Comfort measures only status As of today, 08/25/2022.  Late this AM, notified by RN that patient and family wish to proceed with comfort measures. I returned to bedside to discuss and they confirmed.  Wife, son and patient are in agreement. --Comfort measures placed --Notify provider if signs of pain, discomfort, distress etc despite administering meds ordered --TOC notified to reach out to hospice liaison regarding consideration for inpatient hospice   Hypercalcemia This is at least moderate as calcium corrects to 12.8 mg/dL.  I am going to hold off on further IV fluids as patient  does seem to be already slightly fluid overloaded.  We will treat this by checking PTH vitamin D and ionized calcium level.  Patient has been hypercalcemic in the past as well based on record review.  We will start the patient on calcitonin for more immediate control of calcium and also 1 dose of zoledronic acid.   Patient may require vitamin D supplementation if found to be deficient in same  Abnormal LFTs There has been a progression of patient's liver disease compared to previous baseline labs available.  I believe this is due to worsening of patient's to adeno cancer met to liver as reported by radiologist.  I have requested Dr. Grayland Ormond to kindly evaluate patient in the a.m.  At this time we will check INR PTT for DVT prophylaxis  Leukocytosis This seems to be chronic but again progressive.  During my evaluation I did not encounter any focal signs of infection.  Obviously we still have the urinalysis pending.  At this time patient has received ceftriaxone and metronidazole in the ER, I did discuss with ER attending and his suspicion for a focal infection was also not high.  Regardless given patient's immunocompromise I will draw blood culture at this time.  And monitor patient.  I will not give him further antibiotics at this time  TSH elevation Check free T4 in the a.m., I anticipate sick euthyroid syndrome  Hypermagnesemia Due to AKI, trend maintain on telemetry  Thrombocytosis Heme-onc evaluation in the morning  AKI (acute kidney injury) ([place]) Check urine sodium and creatinine my impression is that this is due to hypercalcemia.  I do not think patient is a in a prerenal state right now, patient has been drinking ample fluids at home reports no diarrhea vomiting or fever.  Reports decent urine output.  Check urinalysis.  Fluid overload Check echo, urine protein and creatinine.  However my impression is that this is likely due to liver disease as it is associated with prominent ascites.  At this time I will not start the patient on diuresis as we are concerned about fixing the hypercalcemia first.  And associated AKI.  Therefore just maintain the patient on fluid restriction and monitor at this time. With intent to diurese soon after.  Leg swelling There are no DVT on ultrasound, I believe this  is likely due to generalized fluid overload as it is associated with slight crackles in the lung, ascites as well as leg swelling.  See fluid overload below  Hyponatremia Moderate, check serum osmolality.  I will maintain the patient on fluid restriction patient reports drinking ample fluids at home this is corroborated by documentation of patient being advised to drink ample fluids at home.  Check urine sodium and creatinine hold hydrochlorothiazide  Hyperkalemia I will hold the patient's losartan, I will hold the patient's oral potassium supplementation, I will give the patient a renal diet.  There are several contributory factors to this including patient's AKI, see below         Consultants: Palliative Care, Oncology, Nephrology Procedures performed: None Disposition: Hospice care Diet recommendation:  Discharge Diet Orders (From admission, onward)     Start     Ordered   08/25/22 0000  Diet - low sodium heart healthy        08/25/22 1510           Regular diet DISCHARGE MEDICATION: Allergies as of 08/25/2022   No Known Allergies      Medication List     STOP  taking these medications    albuterol 108 (90 Base) MCG/ACT inhaler Commonly known as: VENTOLIN HFA Replaced by: albuterol (2.5 MG/3ML) 0.083% nebulizer solution   amLODipine 10 MG tablet Commonly known as: NORVASC   aspirin EC 81 MG tablet   ferrous sulfate 325 (65 FE) MG EC tablet   fluticasone 50 MCG/ACT nasal spray Commonly known as: FLONASE   lidocaine-prilocaine cream Commonly known as: EMLA   losartan 25 MG tablet Commonly known as: COZAAR   metoprolol succinate 100 MG 24 hr tablet Commonly known as: TOPROL-XL   montelukast 10 MG tablet Commonly known as: SINGULAIR   multivitamin with minerals Tabs tablet   omeprazole 20 MG tablet Commonly known as: PRILOSEC OTC   ondansetron 8 MG tablet Commonly known as: Zofran   potassium chloride SA 20 MEQ tablet Commonly known as: KLOR-CON  M   simvastatin 20 MG tablet Commonly known as: ZOCOR   traMADol 50 MG tablet Commonly known as: ULTRAM   triamterene-hydrochlorothiazide 37.5-25 MG tablet Commonly known as: MAXZIDE-25   Vitamin D3 125 MCG (5000 UT) capsule Generic drug: Cholecalciferol       TAKE these medications    acetaminophen 325 MG tablet Commonly known as: TYLENOL Take 2 tablets (650 mg total) by mouth every 6 (six) hours as needed for mild pain (or Fever >/= 101).   albuterol (2.5 MG/3ML) 0.083% nebulizer solution Commonly known as: PROVENTIL Take 3 mLs (2.5 mg total) by nebulization every 4 (four) hours as needed for wheezing. Replaces: albuterol 108 (90 Base) MCG/ACT inhaler   baclofen 1 mg/mL Soln oral solution Commonly known as: OZOBAX Take 10 mLs (10 mg total) by mouth 3 (three) times daily.   bisacodyl 10 MG suppository Commonly known as: DULCOLAX Place 1 suppository (10 mg total) rectally daily as needed for moderate constipation.   chlorproMAZINE 12.5 mg in sodium chloride 0.9 % 25 mL Inject 12.5 mg into the vein every 6 (six) hours as needed.   diazepam 5 MG/ML injection Commonly known as: VALIUM Inject 1 mL (5 mg total) into the vein every 6 (six) hours as needed.   diphenhydrAMINE 50 MG/ML injection Commonly known as: BENADRYL Inject 0.25 mLs (12.5 mg total) into the vein every 4 (four) hours as needed for itching.   glycopyrrolate 1 MG tablet Commonly known as: ROBINUL Take 1 tablet (1 mg total) by mouth every 4 (four) hours as needed (excessive secretions).   haloperidol 0.5 MG tablet Commonly known as: HALDOL Take 1 tablet (0.5 mg total) by mouth every 4 (four) hours as needed for agitation (or delirium).   morphine CONCENTRATE 10 MG/0.5ML Soln concentrated solution Take 0.25 mLs (5 mg total) by mouth every 2 (two) hours as needed for moderate pain (or dyspnea).   mouth rinse Liqd solution 15 mLs by Mouth Rinse route as needed (for oral care).   antiseptic oral  rinse Liqd Apply 15 mLs topically as needed for dry mouth.   ondansetron 4 MG disintegrating tablet Commonly known as: ZOFRAN-ODT Take 1 tablet (4 mg total) by mouth every 6 (six) hours as needed for nausea.   oxyCODONE 5 MG immediate release tablet Commonly known as: Oxy IR/ROXICODONE Take 1 tablet (5 mg total) by mouth every 4 (four) hours as needed for severe pain.   pantoprazole 40 MG tablet Commonly known as: PROTONIX Take 1 tablet (40 mg total) by mouth daily. Start taking on: August 26, 2022   polyvinyl alcohol 1.4 % ophthalmic solution Commonly known as: LIQUIFILM TEARS Place 1 drop  into both eyes 4 (four) times daily as needed for dry eyes.   prochlorperazine 10 MG tablet Commonly known as: COMPAZINE Take 1 tablet (10 mg total) by mouth every 6 (six) hours as needed for nausea or vomiting.   sodium phosphate 7-19 GM/118ML Enem Place 133 mLs (1 enema total) rectally daily as needed for severe constipation.        Discharge Exam: Filed Weights   08/23/22 0252 08/24/22 0500 08/25/22 0429  Weight: 118.2 kg 118.5 kg 117.5 kg   General: very drowsy appearing but awake, NAD, less interactive HEENT: anicteric sclera, MMM, hearing grossly normal Respiratory: tachypneic with frequent hiccups, normal respiratory effort. CTAB but diminished due to shallow inspirations Cardiovascular: RRR, 2+ lower extremity edema b/l has increased Gastrointestinal: more distended today, non-tender  Nervous: Drowsy, O x3. no gross focal neurologic deficits, normal speech Extremities: moves all equally, 2+ lower extremity edema increased Skin: dry, intact, normal temperature Psychiatry: depressed mood, flat affect, intact judgment and insight   Condition at discharge: stable  The results of significant diagnostics from this hospitalization (including imaging, microbiology, ancillary and laboratory) are listed below for reference.   Imaging Studies: ECHOCARDIOGRAM COMPLETE  Result Date:  08/20/2022    ECHOCARDIOGRAM REPORT   Patient Name:   Righteous [place]. Date of Exam: 08/20/2022 Medical Rec #:  [id number]            Height:       71.0 in Accession #:    [id number]           Weight:       257.5 lb Date of Birth:  [birth year]            BSA:          2.348 m Patient Age:    62 years             BP:           117/69 mmHg Patient Gender: M                    HR:           104 bpm. Exam Location:  [facility] Procedure: 2D Echo, Cardiac Doppler and Color Doppler Indications:     CHF-acute systolic [id number]  History:         Patient has no prior history of Echocardiogram examinations.                  Risk Factors:Hypertension.  Sonographer:     Sherrie Sport Referring Phys:  [id number] [facility] GOEL Diagnosing Phys: Kathlyn Sacramento MD  Sonographer Comments: Technically challenging study due to limited acoustic windows, no apical window and no subcostal window. Image acquisition challenging due to respiratory motion. IMPRESSIONS  1. Left ventricular ejection fraction, by estimation, is 55 to 60%. The left ventricle has normal function. Left ventricular endocardial border not optimally defined to evaluate regional wall motion. Left ventricular diastolic function could not be evaluated.  2. Right ventricular systolic function is normal. The right ventricular size is normal. Tricuspid regurgitation signal is inadequate for assessing PA pressure.  3. The mitral valve is normal in structure. No evidence of mitral valve regurgitation. No evidence of mitral stenosis.  4. The aortic valve is normal in structure. Aortic valve regurgitation is not visualized. No aortic stenosis is present.  5. Challenging image quality with only limited views. FINDINGS  Left Ventricle: Left ventricular ejection fraction, by estimation, is 55 to 60%. The left ventricle has  normal function. Left ventricular endocardial border not optimally defined to evaluate regional wall motion. The left ventricular internal cavity size was normal in size. There is  no left ventricular hypertrophy. Left ventricular diastolic function could not be evaluated. Right Ventricle: The right ventricular size is normal. No increase in right ventricular wall thickness. Right ventricular systolic function is normal. Tricuspid regurgitation signal is inadequate for assessing PA pressure. Left Atrium: Left atrial size was normal in size. Right Atrium: Right atrial size was normal in size. Pericardium: There is no evidence of pericardial effusion. Mitral Valve: The mitral valve is normal in structure. No evidence of mitral valve regurgitation. No evidence of mitral valve stenosis. Tricuspid Valve: The tricuspid valve is normal in structure. Tricuspid valve regurgitation is not demonstrated. No evidence of tricuspid stenosis. Aortic Valve: The aortic valve is normal in structure. Aortic valve regurgitation is not visualized. No aortic stenosis is present. Pulmonic Valve: The pulmonic valve was normal in structure. Pulmonic valve regurgitation is not visualized. No evidence of pulmonic stenosis. Aorta: The aortic root is normal in size and structure. Venous: The inferior vena cava was not well visualized. IAS/Shunts: No atrial level shunt detected by color flow Doppler.  LEFT VENTRICLE PLAX 2D LVIDd:         3.20 cm LVIDs:         2.30 cm LV PW:         1.30 cm LV IVS:        0.80 cm LVOT diam:     2.10 cm LVOT Area:     3.46 cm  LEFT ATRIUM         Index LA diam:    4.00 cm 1.70 cm/m   AORTA Ao Root diam: 3.30 cm  SHUNTS Systemic Diam: 2.10 cm Kathlyn Sacramento MD Electronically signed by Kathlyn Sacramento MD Signature Date/Time: 08/20/2022/2:23:09 PM    Final    CT Angio Chest PE W and/or Wo Contrast  Result Date: 08/19/2022 CLINICAL DATA:  Abdominal pain and chest pain. Concern for pulmonary embolism and bowel obstruction. Metastatic adenocarcinoma of small-bowel. EXAM: CT ANGIOGRAPHY CHEST CT ABDOMEN AND PELVIS WITH CONTRAST TECHNIQUE: Multidetector CT imaging of the chest was performed using  the standard protocol during bolus administration of intravenous contrast. Multiplanar CT image reconstructions and MIPs were obtained to evaluate the vascular anatomy. Multidetector CT imaging of the abdomen and pelvis was performed using the standard protocol during bolus administration of intravenous contrast. RADIATION DOSE REDUCTION: This exam was performed according to the departmental dose-optimization program which includes automated exposure control, adjustment of the mA and/or kV according to patient size and/or use of iterative reconstruction technique. CONTRAST:  170m OMNIPAQUE IOHEXOL 350 MG/ML SOLN COMPARISON:  Chest radiograph dated 08/19/2022 and CT dated 08/14/2022. CT abdomen pelvis dated 07/31/2022. FINDINGS: Evaluation of this exam is limited due to respiratory motion artifact. CTA CHEST FINDINGS Cardiovascular: There is no cardiomegaly or pericardial effusion. The thoracic aorta is unremarkable. Evaluation of the pulmonary arteries is limited due to respiratory motion and suboptimal visualization of the peripheral branches. No central pulmonary artery embolus identified. Mediastinum/Nodes: No hilar adenopathy. Mildly rounded subcarinal lymph node measures 8 mm short axis. The esophagus is grossly unremarkable. No mediastinal fluid collection. Right-sided Port-A-Cath with tip at the cavoatrial junction. Lungs/Pleura: Shallow inspiration. There are bibasilar linear and streaky atelectasis. Multiple bilateral pulmonary nodules measuring up to 8 mm in the upper pole right upper lobe in keeping with known metastatic disease. Trace right pleural effusion. No pneumothorax. The central  airways are patent. Musculoskeletal: Degenerative changes of the spine. No acute osseous pathology. Review of the MIP images confirms the above findings. CT ABDOMEN and PELVIS FINDINGS No intra-abdominal free air. Small ascites, increased since the prior CT. Hepatobiliary: Multiple (greater than 20) hepatic hypodense  metastatic disease similar or progressed since the prior CT. No calcified gallstone. Pancreas: The pancreas is unremarkable as visualized. Spleen: Normal in size without focal abnormality. Adrenals/Urinary Tract: The adrenal glands unremarkable. There is no hydronephrosis on either side. There is a 7 cm cyst in the upper pole of the left kidney. There is symmetric enhancement and excretion of contrast by both kidneys. The visualized ureters and the urinary bladder is unremarkable. Stomach/Bowel: Ill-defined thickening of the small bowel loop in the mid abdomen as seen on the prior CT in keeping with known malignancy. There is no bowel obstruction. The appendix is normal. Vascular/Lymphatic: Mild aortoiliac atherosclerotic disease. The IVC is unremarkable. No portal venous gas. Periportal adenopathy measure approximately 19 mm in short axis. There is extensive omental nodularity, progressed since the prior CT. An omental mass in the left anterior abdomen measures 8.8 x 3.3 cm. Overall increase in the omental implants since the prior CT. Reproductive: Prostatectomy. Other: Mild subcutaneous edema. Musculoskeletal: Degenerative changes.  No acute osseous pathology. Review of the MIP images confirms the above findings. IMPRESSION: 1. No acute intrathoracic pathology. No central pulmonary artery embolus identified. 2. Multiple bilateral pulmonary nodules in keeping with known metastatic disease and relatively similar to prior CT of 08/14/2022. 3. Progression of metastatic disease in the abdomen and pelvis since the CT of 07/31/2022. There is increased ascites and progression of omental caking. 4. Ill-defined thickening of the small bowel loop in the mid abdomen in keeping with known malignancy. No bowel obstruction. Normal appendix. 5. Hepatic metastatic disease, similar or progressed since the prior CT. 6.  Aortic Atherosclerosis (ICD10-I70.0). Electronically Signed   By: Anner Crete M.D.   On: 08/19/2022 19:05    CT ABDOMEN PELVIS W CONTRAST  Result Date: 08/19/2022 CLINICAL DATA:  Abdominal pain and chest pain. Concern for pulmonary embolism and bowel obstruction. Metastatic adenocarcinoma of small-bowel. EXAM: CT ANGIOGRAPHY CHEST CT ABDOMEN AND PELVIS WITH CONTRAST TECHNIQUE: Multidetector CT imaging of the chest was performed using the standard protocol during bolus administration of intravenous contrast. Multiplanar CT image reconstructions and MIPs were obtained to evaluate the vascular anatomy. Multidetector CT imaging of the abdomen and pelvis was performed using the standard protocol during bolus administration of intravenous contrast. RADIATION DOSE REDUCTION: This exam was performed according to the departmental dose-optimization program which includes automated exposure control, adjustment of the mA and/or kV according to patient size and/or use of iterative reconstruction technique. CONTRAST:  160m OMNIPAQUE IOHEXOL 350 MG/ML SOLN COMPARISON:  Chest radiograph dated 08/19/2022 and CT dated 08/14/2022. CT abdomen pelvis dated 07/31/2022. FINDINGS: Evaluation of this exam is limited due to respiratory motion artifact. CTA CHEST FINDINGS Cardiovascular: There is no cardiomegaly or pericardial effusion. The thoracic aorta is unremarkable. Evaluation of the pulmonary arteries is limited due to respiratory motion and suboptimal visualization of the peripheral branches. No central pulmonary artery embolus identified. Mediastinum/Nodes: No hilar adenopathy. Mildly rounded subcarinal lymph node measures 8 mm short axis. The esophagus is grossly unremarkable. No mediastinal fluid collection. Right-sided Port-A-Cath with tip at the cavoatrial junction. Lungs/Pleura: Shallow inspiration. There are bibasilar linear and streaky atelectasis. Multiple bilateral pulmonary nodules measuring up to 8 mm in the upper pole right upper lobe in keeping with known metastatic  disease. Trace right pleural effusion. No pneumothorax.  The central airways are patent. Musculoskeletal: Degenerative changes of the spine. No acute osseous pathology. Review of the MIP images confirms the above findings. CT ABDOMEN and PELVIS FINDINGS No intra-abdominal free air. Small ascites, increased since the prior CT. Hepatobiliary: Multiple (greater than 20) hepatic hypodense metastatic disease similar or progressed since the prior CT. No calcified gallstone. Pancreas: The pancreas is unremarkable as visualized. Spleen: Normal in size without focal abnormality. Adrenals/Urinary Tract: The adrenal glands unremarkable. There is no hydronephrosis on either side. There is a 7 cm cyst in the upper pole of the left kidney. There is symmetric enhancement and excretion of contrast by both kidneys. The visualized ureters and the urinary bladder is unremarkable. Stomach/Bowel: Ill-defined thickening of the small bowel loop in the mid abdomen as seen on the prior CT in keeping with known malignancy. There is no bowel obstruction. The appendix is normal. Vascular/Lymphatic: Mild aortoiliac atherosclerotic disease. The IVC is unremarkable. No portal venous gas. Periportal adenopathy measure approximately 19 mm in short axis. There is extensive omental nodularity, progressed since the prior CT. An omental mass in the left anterior abdomen measures 8.8 x 3.3 cm. Overall increase in the omental implants since the prior CT. Reproductive: Prostatectomy. Other: Mild subcutaneous edema. Musculoskeletal: Degenerative changes.  No acute osseous pathology. Review of the MIP images confirms the above findings. IMPRESSION: 1. No acute intrathoracic pathology. No central pulmonary artery embolus identified. 2. Multiple bilateral pulmonary nodules in keeping with known metastatic disease and relatively similar to prior CT of 08/14/2022. 3. Progression of metastatic disease in the abdomen and pelvis since the CT of 07/31/2022. There is increased ascites and progression of omental caking. 4.  Ill-defined thickening of the small bowel loop in the mid abdomen in keeping with known malignancy. No bowel obstruction. Normal appendix. 5. Hepatic metastatic disease, similar or progressed since the prior CT. 6.  Aortic Atherosclerosis (ICD10-I70.0). Electronically Signed   By: Anner Crete M.D.   On: 08/19/2022 19:05   US Venous Img Lower Bilateral  Result Date: 08/19/2022 CLINICAL DATA:  BLE edema EXAM: BILATERAL LOWER EXTREMITY VENOUS DOPPLER ULTRASOUND TECHNIQUE: Gray-scale sonography with compression, as well as color and duplex ultrasound, were performed to evaluate the deep venous system(s) from the level of the common femoral vein through the popliteal and proximal calf veins. COMPARISON:  None Available. FINDINGS: VENOUS Normal compressibility of the common femoral, superficial femoral, and popliteal veins, as well as the visualized calf veins. Visualized portions of profunda femoral vein and great saphenous vein unremarkable. No filling defects to suggest DVT on grayscale or color Doppler imaging. Doppler waveforms show normal direction of venous flow, normal respiratory plasticity and response to augmentation. OTHER None. Limitations: none IMPRESSION: Negative. Electronically Signed   By: Valentino Saxon M.D.   On: 08/19/2022 17:49   DG Chest 2 View  Result Date: 08/19/2022 CLINICAL DATA:  History of liver cancer.  Bilateral leg swelling EXAM: CHEST - 2 VIEW COMPARISON:  CT of the chest August 14, 2022 FINDINGS: Bibasilar atelectasis remains. A right Port-A-Cath is in good position. No pneumothorax. The cardiomediastinal silhouette is stable. No other suspicious findings. IMPRESSION: Bibasilar atelectasis. No other acute abnormalities. Electronically Signed   By: Dorise Bullion III M.D.   On: 08/19/2022 16:23   CT Chest W Contrast  Result Date: 08/15/2022 CLINICAL DATA:  Staging exam. 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 * Tracking Code: BO * EXAM: CT CHEST WITH CONTRAST  TECHNIQUE: Multidetector  CT imaging of the chest was performed during intravenous contrast administration. RADIATION DOSE REDUCTION: This exam was performed according to the departmental dose-optimization program which includes automated exposure control, adjustment of the mA and/or kV according to patient size and/or use of iterative reconstruction technique. CONTRAST:  19m OMNIPAQUE IOHEXOL 300 MG/ML  SOLN COMPARISON:  None Available. FINDINGS: Cardiovascular: The heart size is normal. No substantial pericardial effusion. Coronary artery calcification is evident. No thoracic aortic aneurysm. Right Port-A-Cath tip is positioned at the SVC/RA junction. Enlargement of the pulmonary outflow tract/main pulmonary arteries suggests pulmonary arterial hypertension. Mediastinum/Nodes: Mild lymphadenopathy noted around the inferior descending thoracic aorta. There is no hilar lymphadenopathy. The esophagus has normal imaging features. There is no axillary lymphadenopathy. Left-sided retrocrural lymphadenopathy evident. Lungs/Pleura: Numerous bilateral pulmonary nodules identified involving all lobes of both lungs. Index right upper lobe pulmonary nodule on image 52/4 measures 8 mm. Index nodule in the left upper lobe measures 8 mm on image 34/4. Left lower lobe index nodule measures 8 mm on image 91/4. There is bilateral lower lobe atelectasis, right greater than left. No dense focal airspace consolidation. No pleural effusion. Upper Abdomen: Multiple hepatic metastases again noted as characterized on recent abdomen/pelvis CT. Index lesion today in the anterior hepatic dome measuring 2.9 cm on image 101/2 was 2.4 cm previously (remeasured). A second lesion in the medial right liver measuring 3.1 cm on image 115/2 today was 2.7 cm previously (remeasured). Perihepatic ascites is progressive in the interval with new Peri splenic ascites evident on today's exam. Musculoskeletal: No worrisome lytic or sclerotic osseous  abnormality. IMPRESSION: 1. Numerous bilateral pulmonary nodules involving all lobes of both lungs measure up to 8 mm in size, consistent with metastatic disease. 2. Mild lymphadenopathy around the inferior descending thoracic aorta and in the left retrocrural space, consistent with metastatic disease. 3. Multiple hepatic metastases as characterized on recent abdomen/pelvis CT. Lesions visible in the upper liver today appear minimally progressive in the 2 week interval since the prior abdomen/pelvis CT 4. Interval progression of perihepatic ascites with new perisplenic ascites evident on today's exam. 5. Enlargement of the pulmonary outflow tract/main pulmonary arteries suggests pulmonary arterial hypertension. Electronically Signed   By: EMisty StanleyM.D.   On: 08/15/2022 07:05   IR IMAGING GUIDED PORT INSERTION  Result Date: 08/13/2022 INDICATION: port placement for chemotherapy EXAM: Chest port placement using ultrasound and fluoroscopic guidance MEDICATIONS: Documented in the EMR ANESTHESIA/SEDATION: Moderate (conscious) sedation was employed during this procedure. A total of Versed 2 mg and Fentanyl 100 mcg was administered intravenously. Moderate Sedation Time: 30 minutes. The patient's level of consciousness and vital signs were monitored continuously by radiology nursing throughout the procedure under my direct supervision. FLUOROSCOPY TIME:  Fluoroscopy Time: 0.9 minutes (8 mGy) COMPLICATIONS: None immediate. PROCEDURE: Informed written consent was obtained from the patient after a thorough discussion of the procedural risks, benefits and alternatives. All questions were addressed. Maximal Sterile Barrier Technique was utilized including caps, mask, sterile gowns, sterile gloves, sterile drape, hand hygiene and skin antiseptic. A timeout was performed prior to the initiation of the procedure. The patient was placed supine on the exam table. The right neck and chest was prepped and draped in the  standard sterile fashion. A preliminary ultrasound of the right neck was performed and demonstrates a patent right internal jugular vein. A permanent ultrasound image was stored in the electronic medical record. The overlying skin was anesthetized with 1% Lidocaine. Using ultrasound guidance, access was obtained into the right internal  jugular vein using a 21 gauge micropuncture set. A wire was advanced into the SVC, a short incision was made at the puncture site, and serial dilatation performed. Next, in an ipsilateral infraclavicular location, an incision was made at the site of the subcutaneous reservoir. Blunt dissection was used to open a pocket to contain the reservoir. A subcutaneous tunnel was then created from the port site to the puncture site. A(n) 8 Fr single lumen catheter was advanced through the tunnel. The catheter was attached to the port and this was placed in the subcutaneous pocket. Under fluoroscopic guidance, a peel away sheath was placed, and the catheter was trimmed to the appropriate length and was advanced into the central veins. The catheter length is 23 cm. The tip of the catheter lies near the superior cavoatrial junction. The port flushes and aspirates appropriately. The port was flushed and locked with heparinized saline. The port pocket was closed in 2 layers using 3-0 and 4-0 Vicryl/absorbable suture. Dermabond was also applied to both incisions. The patient tolerated the procedure well and was transferred to recovery in stable condition. IMPRESSION: Successful placement of a right-sided chest port via the right internal jugular vein. The port is ready for immediate use. Electronically Signed   By: Albin Felling M.D.   On: 08/13/2022 16:14   US BIOPSY (LIVER)  Result Date: 08/06/2022 INDICATION: liver mass EXAM: ULTRASOUND GUIDED LIVER MASS BIOPSY COMPARISON:  CT AP, 07/31/2022. MEDICATIONS: None ANESTHESIA/SEDATION: Moderate (conscious) sedation was employed during this  procedure. A total of Versed 1 mg and Fentanyl 50 mcg was administered intravenously. Moderate Sedation Time: 10 minutes. The patient's level of consciousness and vital signs were monitored continuously by radiology nursing throughout the procedure under my direct supervision. COMPLICATIONS: None immediate. PROCEDURE: Informed written consent was obtained from the patient and/or patient's representative after a discussion of the risks, benefits and alternatives to treatment. The patient understands and consents the procedure. A timeout was performed prior to the initiation of the procedure. Ultrasound scanning was performed of the right upper abdominal quadrant demonstrates multifocal liver masses A LEFT hepatic lobe mass was selected for biopsy and the procedure was planned. The right upper abdominal quadrant was prepped and draped in the usual sterile fashion. The overlying soft tissues were anesthetized with 1% lidocaine with epinephrine. A 17 gauge, 6.8 cm co-axial needle was advanced into a peripheral aspect of the lesion. This was followed by 3 core biopsies with an 18 gauge core device under direct ultrasound guidance. The coaxial needle tract was embolized with a small amount of Gel-Foam slurry and superficial hemostasis was obtained with manual compression. Post procedural scanning was negative for definitive area of hemorrhage or additional complication. A dressing was placed. The patient tolerated the procedure well without immediate post procedural complication. IMPRESSION: Successful ultrasound guided core needle biopsy of liver mass. Michaelle Birks, MD [facility] Electronically Signed   By: Michaelle Birks M.D.   On: 08/06/2022 14:53   CT ABDOMEN PELVIS W CONTRAST  Result Date: 07/31/2022 CLINICAL DATA:  30 pound weight loss. Increased calcium level. Increased liver enzymes. Elevated white blood cell count. Prostatectomy 4 years ago. * Tracking  Code: BO * EXAM: CT ABDOMEN AND PELVIS WITH CONTRAST TECHNIQUE: Multidetector CT imaging of the abdomen and pelvis was performed using the standard protocol following bolus administration of intravenous contrast. RADIATION DOSE REDUCTION: This exam was performed according to the departmental dose-optimization program which includes automated exposure control, adjustment of the mA  and/or kV according to patient size and/or use of iterative reconstruction technique. CONTRAST:  169m OMNIPAQUE IOHEXOL 300 MG/ML  SOLN COMPARISON:  05/29/2022 chest radiograph.  Prostate MRI 03/06/2019. FINDINGS: Lower chest: Bibasilar pulmonary nodules. Example right middle lobe 5 mm nodule on 08/04. Normal heart size without pericardial or pleural effusion. Areas of left-sided pleural-based nodularity versus subpleural lymph nodes. Example 8 mm on 06/02. Hepatobiliary: Innumerable, bilateral liver masses consistent with metastasis. Example posterior right hepatic lobe (segment 7) 3.7 x 3.0 cm on 21/2. Central anterior segment 2 mass measures 3.4 x 3.2 cm on 20/2. No calcified gallstone or biliary duct dilatation. Pancreas: Normal, without mass or ductal dilatation. Spleen: Normal in size, without focal abnormality. Adrenals/Urinary Tract: Normal right adrenal gland. Mild left adrenal thickening and nodularity, including at up to 1.0 cm on 25/2. Interpolar left renal 5.7 cm fluid density lesion is likely a cyst . In the absence of clinically indicated signs/symptoms require(s) no independent follow-up. Normal right kidney. No hydronephrosis. Normal urinary bladder. Stomach/Bowel: Proximal gastric underdistention. Scattered colonic diverticula. Normal terminal ileum and appendix. A loop of markedly thickened jejunum is identified on 57/2 and coronal image 57. Vascular/Lymphatic: Aortic atherosclerosis. Porta hepatis adenopathy including a portacaval 1.6 cm node on 36/2. Small bowel mesenteric adenopathy, centered about the thickened small  bowel loop. Example nodes at up to 2.6 x 2.4 cm on 55/2. No pelvic sidewall adenopathy. Reproductive: Prostatectomy, without local recurrence. Other: Small volume abdominopelvic ascites. Extensive peritoneal metastasis. Example left omental implant of 4.2 x 4.5 cm on 47/2. No free intraperitoneal air. Tiny fat containing right inguinal hernia. Small fat containing paraumbilical hernia. Mild right-sided gynecomastia. Musculoskeletal: Presumably degenerative partial fusion of the bilateral sacroiliac joints. Lumbosacral spondylosis. IMPRESSION: 1. Widespread metastatic disease, including to liver, abdominal nodes, peritoneum, and likely the left pleural space. 2. 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 3. Small bibasilar pulmonary nodules, nonspecific but mildly suspicious for metastatic disease. 4. Small volume abdominopelvic ascites. 5. Prostatectomy, without local recurrence. Distribution of metastatic disease felt unlikely to represent prostate. 6. Indeterminate left adrenal nodule. 7.  Aortic Atherosclerosis (ICD10-I70.0). Electronically Signed   By: KAbigail MiyamotoM.D.   On: 07/31/2022 11:38    Microbiology: Results for orders placed or performed during the hospital encounter of 08/19/22  Culture, blood (Routine X 2) w Reflex to ID Panel     Status: None   Collection Time: 08/19/22 10:14 PM   Specimen: BLOOD  Result Value Ref Range Status   Specimen Description BLOOD BLOOD LEFT HAND  Final   Special Requests   Final    BOTTLES DRAWN AEROBIC AND ANAEROBIC Blood Culture adequate volume   Culture   Final    NO GROWTH 5 DAYS [facility] [address] [place] [address]   Report Status 08/24/2022 FINAL  Final  Culture, blood (Routine X 2) w Reflex to ID Panel     Status: None   Collection Time: 08/19/22 10:14 PM   Specimen: BLOOD  Result Value Ref Range Status   Specimen  Description BLOOD BLOOD RIGHT HAND  Final   Special Requests   Final    BOTTLES DRAWN AEROBIC AND ANAEROBIC Blood Culture results may not be optimal due to an inadequate volume of blood received in culture bottles   Culture   Final    NO GROWTH 5 DAYS [facility] [address], [place] [address]  Report Status 08/24/2022 FINAL  Final    Labs: CBC: Recent Labs  Lab 08/21/22 0359 08/22/22 0453 08/23/22 0521 08/24/22 0632 08/25/22 0558  WBC 21.4* 19.2* 19.0* 19.6* 25.0*  HGB 9.7* 9.4* 9.9* 9.5* 9.8*  HCT 31.1* 29.9* 30.9* 30.0* 31.0*  MCV 76.0* 75.7* 76.1* 75.8* 75.2*  PLT 691* 711* 719* 653* 0000000*   Basic Metabolic Panel: Recent Labs  Lab 08/19/22 1657 08/19/22 2256 08/20/22 0453 08/21/22 0359 08/22/22 0453 08/22/22 1131 08/23/22 0521 08/23/22 1558 08/24/22 0632 08/25/22 0558  NA  --    < > 127*   < > 128* 124* 126* 125* 123* 122*  K  --    < > 5.0   < > 5.1  --  5.2* 5.2* 4.7 5.0  CL  --    < > 95*   < > 99  --  98 95* 94* 94*  CO2  --    < > 21*   < > 21*  --  20* 20* 17* 17*  GLUCOSE  --    < > 99   < > 100*  --  103* 123* 89 106*  BUN  --    < > 38*   < > 49*  --  57* 62* 63* 71*  CREATININE  --    < > 0.99   < > 1.22  --  1.51* 1.78* 1.65* 2.57*  CALCIUM  --    < > 10.8*   < > 9.8  --  9.5 9.2 9.0 8.8*  MG 2.8*  --  2.5*  --   --   --  2.6*  --   --   --   PHOS 3.0  --  2.4*  --   --   --   --   --   --   --    < > = values in this interval not displayed.   Liver Function Tests: Recent Labs  Lab 08/21/22 0359 08/22/22 0453 08/23/22 0521 08/24/22 0632 08/25/22 0558  AST 124* 151* 201* 250* 253*  ALT 64* 70* 89* 102* 118*  ALKPHOS 284* 338* 414* 528* 561*  BILITOT 5.0* 5.9* 6.7* 6.9* 7.4*  PROT 6.4* 5.9* 6.3* 5.8* 6.2*  ALBUMIN 1.9* 1.8* 1.9* 1.8* 1.8*   CBG: Recent Labs  Lab 08/21/22 0740  GLUCAP 113*    Discharge time spent: less than 30 minutes.  Signed: Ezekiel Slocumb, DO [facility] 08/25/2022

## 2022-08-25 NOTE — TOC Initial Note (Signed)
Transition of Care (TOC) - Initial/Assessment Note    Patient Details  Name: Philip Arnold. MRN: [id number] Date of Birth: [birth year]  Transition of Care [facility]) CM/SW Contact:    [address], [place] Phone Number: 08/25/2022, 12:18 PM  Clinical Narrative:                  CSW spoke with pt's spouse concerning hospice referral, she states she is interested in residential hospice in [place] through [facility]. CSW sent hospital liaison a secure chat requesting pt be reviewed.         Patient Goals and CMS Choice            Expected Discharge Plan and Services                                              Prior Living Arrangements/Services                       Activities of Daily Living Home Assistive Devices/Equipment: Eyeglasses ADL Screening (condition at time of admission) Patient's cognitive ability adequate to safely complete daily activities?: Yes Is the patient deaf or have difficulty hearing?: No Does the patient have difficulty seeing, even when wearing glasses/contacts?: No Does the patient have difficulty concentrating, remembering, or making decisions?: No Patient able to express need for assistance with ADLs?: Yes Does the patient have difficulty dressing or bathing?: Yes Independently performs ADLs?: Yes (appropriate for developmental age) Does the patient have difficulty walking or climbing stairs?: Yes Weakness of Legs: Both Weakness of Arms/Hands: None  Permission Sought/Granted                  Emotional Assessment              Admission diagnosis:  Anasarca [R60.1] Hyponatremia [E87.1] AKI (acute kidney injury) ([place]) [N17.9] Fluid overload [E87.70] 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, 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 [facility]) [C78.89] Patient Active Problem List   Diagnosis Date Noted   Comfort measures only status 08/25/2022   Palliative care encounter 08/21/2022   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[place]) 08/20/2022   Metastatic adenocarcinoma ([place]) 08/20/2022   Hyperkalemia 08/19/2022   Hyponatremia 08/19/2022   Leg swelling 08/19/2022   Fluid overload 08/19/2022   Anasarca 08/19/2022   AKI (acute kidney injury) ([place]) 08/19/2022   Thrombocytosis 08/19/2022   Hypermagnesemia 08/19/2022   TSH elevation 08/19/2022   Hypercalcemia 07/31/2022   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[place]) 07/31/2022   HLD (hyperlipidemia) 07/31/2022   Abnormal LFTs 07/31/2022   Leukocytosis 07/31/2022   Microcytic anemia 07/31/2022   Prostate cancer ([place]) 04/24/2019   Hip pain 04/29/2018   Hyperglycemia 04/29/2018   Hypertension 04/29/2018   Obesity (BMI 30-39.9) 04/29/2018   Pure hypercholesterolemia 04/29/2018   Elevated PSA 12/05/2016   PCP:  [name], Philip Pharmacy:   [facility] [phone] [address], [place] AT [facility] [address] [place] [address] Phone: [phone] Fax: Mobile [address] [place] [employer] [place] [address] Phone: [phone] Fax: [phone]     Social Determinants of Health (SDOH) Social History: SDOH Screenings   Food Insecurity: No Food Insecurity (08/19/2022)  Housing: Low Risk  (08/19/2022)  Transportation Needs: No Transportation Needs (08/19/2022)  Utilities: Not At Risk (08/19/2022)  Tobacco Use: Low Risk  (08/19/2022)   SDOH Interventions:     Readmission Risk Interventions     No data to display

## 2022-08-27 ENCOUNTER — Other Ambulatory Visit: Payer: Self-pay | Admitting: Oncology

## 2022-08-29 ENCOUNTER — Ambulatory Visit: Payer: BC Managed Care – PPO | Admitting: Oncology

## 2022-08-29 ENCOUNTER — Ambulatory Visit: Payer: BC Managed Care – PPO

## 2022-08-29 ENCOUNTER — Other Ambulatory Visit: Payer: BC Managed Care – PPO

## 2022-09-03 ENCOUNTER — Other Ambulatory Visit: Payer: BC Managed Care – PPO

## 2022-09-03 ENCOUNTER — Ambulatory Visit: Payer: BC Managed Care – PPO | Admitting: Oncology

## 2022-09-03 ENCOUNTER — Ambulatory Visit: Payer: BC Managed Care – PPO

## 2022-09-12 ENCOUNTER — Other Ambulatory Visit: Payer: BC Managed Care – PPO

## 2022-09-12 ENCOUNTER — Ambulatory Visit: Payer: BC Managed Care – PPO

## 2022-09-12 ENCOUNTER — Ambulatory Visit: Payer: BC Managed Care – PPO | Admitting: Oncology

## 2022-09-17 ENCOUNTER — Ambulatory Visit: Payer: BC Managed Care – PPO | Admitting: Oncology

## 2022-09-17 ENCOUNTER — Ambulatory Visit: Payer: BC Managed Care – PPO

## 2022-09-17 ENCOUNTER — Other Ambulatory Visit: Payer: BC Managed Care – PPO

## 2022-09-17 DEATH — deceased

## 2022-12-04 ENCOUNTER — Other Ambulatory Visit: Payer: BC Managed Care – PPO

## 2022-12-11 ENCOUNTER — Ambulatory Visit: Payer: BC Managed Care – PPO | Admitting: Urology
